# Patient Record
Sex: Female | Born: 1955
Health system: Southern US, Community
[De-identification: ages and names within clinical notes are randomized; demographics above are authoritative.]

## PROBLEM LIST (undated history)

## (undated) DIAGNOSIS — M1990 Unspecified osteoarthritis, unspecified site: Secondary | ICD-10-CM

## (undated) DIAGNOSIS — I700 Atherosclerosis of aorta: Secondary | ICD-10-CM

## (undated) DIAGNOSIS — N760 Acute vaginitis: Secondary | ICD-10-CM

## (undated) DIAGNOSIS — I471 Supraventricular tachycardia: Secondary | ICD-10-CM

## (undated) DIAGNOSIS — M810 Age-related osteoporosis without current pathological fracture: Secondary | ICD-10-CM

## (undated) DIAGNOSIS — E785 Hyperlipidemia, unspecified: Secondary | ICD-10-CM

## (undated) DIAGNOSIS — C50412 Malignant neoplasm of upper-outer quadrant of left female breast: Secondary | ICD-10-CM

## (undated) DIAGNOSIS — K219 Gastro-esophageal reflux disease without esophagitis: Secondary | ICD-10-CM

## (undated) DIAGNOSIS — Z923 Personal history of irradiation: Secondary | ICD-10-CM

## (undated) DIAGNOSIS — I77810 Thoracic aortic ectasia: Secondary | ICD-10-CM

## (undated) DIAGNOSIS — E669 Obesity, unspecified: Secondary | ICD-10-CM

## (undated) DIAGNOSIS — J040 Acute laryngitis: Secondary | ICD-10-CM

## (undated) DIAGNOSIS — E890 Postprocedural hypothyroidism: Secondary | ICD-10-CM

## (undated) DIAGNOSIS — I5032 Chronic diastolic (congestive) heart failure: Secondary | ICD-10-CM

## (undated) DIAGNOSIS — K635 Polyp of colon: Secondary | ICD-10-CM

## (undated) DIAGNOSIS — Z9889 Other specified postprocedural states: Secondary | ICD-10-CM

## (undated) DIAGNOSIS — R112 Nausea with vomiting, unspecified: Secondary | ICD-10-CM

## (undated) DIAGNOSIS — I10 Essential (primary) hypertension: Secondary | ICD-10-CM

## (undated) DIAGNOSIS — C801 Malignant (primary) neoplasm, unspecified: Secondary | ICD-10-CM

## (undated) DIAGNOSIS — D34 Benign neoplasm of thyroid gland: Secondary | ICD-10-CM

## (undated) DIAGNOSIS — N952 Postmenopausal atrophic vaginitis: Secondary | ICD-10-CM

## (undated) DIAGNOSIS — R931 Abnormal findings on diagnostic imaging of heart and coronary circulation: Secondary | ICD-10-CM

## (undated) DIAGNOSIS — F419 Anxiety disorder, unspecified: Secondary | ICD-10-CM

## (undated) DIAGNOSIS — R600 Localized edema: Secondary | ICD-10-CM

## (undated) HISTORY — DX: Atherosclerosis of aorta: I70.0

## (undated) HISTORY — DX: Acute vaginitis: N76.0

## (undated) HISTORY — DX: Hyperlipidemia, unspecified: E78.5

## (undated) HISTORY — PX: BREAST LUMPECTOMY: SHX2

## (undated) HISTORY — DX: Chronic diastolic (congestive) heart failure: I50.32

## (undated) HISTORY — DX: Polyp of colon: K63.5

## (undated) HISTORY — DX: Malignant neoplasm of upper-outer quadrant of left female breast: C50.412

## (undated) HISTORY — DX: Essential (primary) hypertension: I10

## (undated) HISTORY — DX: Postprocedural hypothyroidism: E89.0

## (undated) HISTORY — DX: Thoracic aortic ectasia: I77.810

## (undated) HISTORY — DX: Obesity, unspecified: E66.9

## (undated) HISTORY — DX: Unspecified osteoarthritis, unspecified site: M19.90

## (undated) HISTORY — PX: BREAST SURGERY: SHX581

## (undated) HISTORY — DX: Localized edema: R60.0

## (undated) HISTORY — DX: Postmenopausal atrophic vaginitis: N95.2

## (undated) HISTORY — DX: Abnormal findings on diagnostic imaging of heart and coronary circulation: R93.1

## (undated) HISTORY — DX: Age-related osteoporosis without current pathological fracture: M81.0

## (undated) HISTORY — DX: Supraventricular tachycardia: I47.1

---

## 1999-08-08 ENCOUNTER — Other Ambulatory Visit: Admission: RE | Admit: 1999-08-08 | Discharge: 1999-08-08 | Payer: Self-pay | Admitting: Family Medicine

## 2000-02-11 ENCOUNTER — Ambulatory Visit (HOSPITAL_COMMUNITY): Admission: RE | Admit: 2000-02-11 | Discharge: 2000-02-11 | Payer: Self-pay | Admitting: Family Medicine

## 2000-02-11 ENCOUNTER — Encounter: Payer: Self-pay | Admitting: Family Medicine

## 2000-08-11 ENCOUNTER — Other Ambulatory Visit: Admission: RE | Admit: 2000-08-11 | Discharge: 2000-08-11 | Payer: Self-pay | Admitting: Obstetrics and Gynecology

## 2001-04-28 ENCOUNTER — Emergency Department (HOSPITAL_COMMUNITY): Admission: EM | Admit: 2001-04-28 | Discharge: 2001-04-28 | Payer: Self-pay | Admitting: Emergency Medicine

## 2001-08-12 ENCOUNTER — Other Ambulatory Visit: Admission: RE | Admit: 2001-08-12 | Discharge: 2001-08-12 | Payer: Self-pay | Admitting: Obstetrics and Gynecology

## 2002-08-30 ENCOUNTER — Other Ambulatory Visit: Admission: RE | Admit: 2002-08-30 | Discharge: 2002-08-30 | Payer: Self-pay | Admitting: Obstetrics and Gynecology

## 2003-11-28 ENCOUNTER — Other Ambulatory Visit: Admission: RE | Admit: 2003-11-28 | Discharge: 2003-11-28 | Payer: Self-pay | Admitting: Obstetrics and Gynecology

## 2004-12-11 ENCOUNTER — Other Ambulatory Visit: Admission: RE | Admit: 2004-12-11 | Discharge: 2004-12-11 | Payer: Self-pay | Admitting: Obstetrics and Gynecology

## 2005-06-05 ENCOUNTER — Encounter: Admission: RE | Admit: 2005-06-05 | Discharge: 2005-06-05 | Payer: Self-pay | Admitting: Family Medicine

## 2005-06-08 ENCOUNTER — Encounter: Admission: RE | Admit: 2005-06-08 | Discharge: 2005-06-08 | Payer: Self-pay | Admitting: Family Medicine

## 2005-08-09 ENCOUNTER — Encounter: Admission: RE | Admit: 2005-08-09 | Discharge: 2005-08-09 | Payer: Self-pay | Admitting: Family Medicine

## 2006-01-01 ENCOUNTER — Other Ambulatory Visit: Admission: RE | Admit: 2006-01-01 | Discharge: 2006-01-01 | Payer: Self-pay | Admitting: Obstetrics and Gynecology

## 2009-12-11 ENCOUNTER — Encounter: Admission: RE | Admit: 2009-12-11 | Discharge: 2009-12-11 | Payer: Self-pay | Admitting: Obstetrics and Gynecology

## 2009-12-19 ENCOUNTER — Encounter: Admission: RE | Admit: 2009-12-19 | Discharge: 2009-12-19 | Payer: Self-pay | Admitting: Obstetrics and Gynecology

## 2009-12-26 ENCOUNTER — Encounter: Admission: RE | Admit: 2009-12-26 | Discharge: 2009-12-26 | Payer: Self-pay | Admitting: Obstetrics and Gynecology

## 2009-12-30 ENCOUNTER — Encounter: Admission: RE | Admit: 2009-12-30 | Discharge: 2009-12-30 | Payer: Self-pay | Admitting: General Surgery

## 2010-01-02 ENCOUNTER — Ambulatory Visit (HOSPITAL_BASED_OUTPATIENT_CLINIC_OR_DEPARTMENT_OTHER): Admission: RE | Admit: 2010-01-02 | Discharge: 2010-01-02 | Payer: Self-pay | Admitting: General Surgery

## 2010-01-02 ENCOUNTER — Encounter: Admission: RE | Admit: 2010-01-02 | Discharge: 2010-01-02 | Payer: Self-pay | Admitting: General Surgery

## 2010-01-08 ENCOUNTER — Ambulatory Visit: Payer: Self-pay | Admitting: Oncology

## 2010-01-20 ENCOUNTER — Ambulatory Visit: Admission: RE | Admit: 2010-01-20 | Discharge: 2010-03-11 | Payer: Self-pay | Admitting: Radiation Oncology

## 2010-03-19 ENCOUNTER — Ambulatory Visit: Payer: Self-pay | Admitting: Oncology

## 2010-03-20 ENCOUNTER — Ambulatory Visit: Payer: Self-pay | Admitting: Psychiatry

## 2010-04-17 ENCOUNTER — Ambulatory Visit: Payer: Self-pay | Admitting: Psychiatry

## 2010-05-19 ENCOUNTER — Ambulatory Visit: Payer: Self-pay | Admitting: Oncology

## 2010-05-21 LAB — CBC WITH DIFFERENTIAL/PLATELET
BASO%: 1.1 % (ref 0.0–2.0)
Basophils Absolute: 0 10e3/uL (ref 0.0–0.1)
EOS%: 3.6 % (ref 0.0–7.0)
Eosinophils Absolute: 0.2 10e3/uL (ref 0.0–0.5)
HCT: 37.8 % (ref 34.8–46.6)
HGB: 13.5 g/dL (ref 11.6–15.9)
LYMPH%: 26.6 % (ref 14.0–49.7)
MCH: 33.8 pg (ref 25.1–34.0)
MCHC: 35.6 g/dL (ref 31.5–36.0)
MCV: 94.9 fL (ref 79.5–101.0)
MONO#: 0.5 10e3/uL (ref 0.1–0.9)
MONO%: 11 % (ref 0.0–14.0)
NEUT#: 2.5 10e3/uL (ref 1.5–6.5)
NEUT%: 57.7 % (ref 38.4–76.8)
Platelets: 199 10e3/uL (ref 145–400)
RBC: 3.98 10e6/uL (ref 3.70–5.45)
RDW: 12.5 % (ref 11.2–14.5)
WBC: 4.3 10e3/uL (ref 3.9–10.3)
lymph#: 1.1 10e3/uL (ref 0.9–3.3)

## 2010-05-21 LAB — COMPREHENSIVE METABOLIC PANEL
ALT: 19 U/L (ref 0–35)
AST: 17 U/L (ref 0–37)
Albumin: 4.1 g/dL (ref 3.5–5.2)
BUN: 11 mg/dL (ref 6–23)
CO2: 24 mEq/L (ref 19–32)
Calcium: 9 mg/dL (ref 8.4–10.5)
Chloride: 106 mEq/L (ref 96–112)
Potassium: 4.4 mEq/L (ref 3.5–5.3)

## 2010-06-18 ENCOUNTER — Ambulatory Visit: Payer: Self-pay | Admitting: Oncology

## 2010-07-10 LAB — CBC WITH DIFFERENTIAL/PLATELET
BASO%: 0.4 % (ref 0.0–2.0)
Basophils Absolute: 0 10e3/uL (ref 0.0–0.1)
EOS%: 3.7 % (ref 0.0–7.0)
Eosinophils Absolute: 0.2 10e3/uL (ref 0.0–0.5)
HCT: 39.4 % (ref 34.8–46.6)
HGB: 13.5 g/dL (ref 11.6–15.9)
LYMPH%: 25.5 % (ref 14.0–49.7)
MCH: 32.7 pg (ref 25.1–34.0)
MCHC: 34.2 g/dL (ref 31.5–36.0)
MCV: 95.7 fL (ref 79.5–101.0)
MONO#: 0.6 10e3/uL (ref 0.1–0.9)
MONO%: 10.6 % (ref 0.0–14.0)
NEUT#: 3.5 10e3/uL (ref 1.5–6.5)
NEUT%: 59.8 % (ref 38.4–76.8)
Platelets: 229 10e3/uL (ref 145–400)
RBC: 4.11 10e6/uL (ref 3.70–5.45)
RDW: 12.6 % (ref 11.2–14.5)
WBC: 5.8 10e3/uL (ref 3.9–10.3)
lymph#: 1.5 10e3/uL (ref 0.9–3.3)

## 2010-09-26 ENCOUNTER — Ambulatory Visit: Payer: Self-pay | Admitting: Oncology

## 2010-11-22 ENCOUNTER — Other Ambulatory Visit: Payer: Self-pay | Admitting: Oncology

## 2010-11-22 DIAGNOSIS — Z9889 Other specified postprocedural states: Secondary | ICD-10-CM

## 2010-11-23 ENCOUNTER — Encounter: Payer: Self-pay | Admitting: Obstetrics and Gynecology

## 2010-12-22 ENCOUNTER — Ambulatory Visit
Admission: RE | Admit: 2010-12-22 | Discharge: 2010-12-22 | Disposition: A | Discharge status: Home / Self Care | Payer: BC Managed Care – PPO | Source: Ambulatory Visit | Attending: Oncology | Admitting: Oncology

## 2010-12-22 DIAGNOSIS — Z9889 Other specified postprocedural states: Secondary | ICD-10-CM

## 2010-12-23 ENCOUNTER — Encounter (HOSPITAL_BASED_OUTPATIENT_CLINIC_OR_DEPARTMENT_OTHER): Payer: BC Managed Care – PPO | Admitting: Oncology

## 2010-12-23 ENCOUNTER — Other Ambulatory Visit: Payer: Self-pay | Admitting: Oncology

## 2010-12-23 DIAGNOSIS — Z170 Estrogen receptor positive status [ER+]: Secondary | ICD-10-CM

## 2010-12-23 DIAGNOSIS — L408 Other psoriasis: Secondary | ICD-10-CM

## 2010-12-23 DIAGNOSIS — G8918 Other acute postprocedural pain: Secondary | ICD-10-CM

## 2010-12-23 DIAGNOSIS — C50219 Malignant neoplasm of upper-inner quadrant of unspecified female breast: Secondary | ICD-10-CM

## 2010-12-23 LAB — CBC WITH DIFFERENTIAL/PLATELET
Eosinophils Absolute: 0.1 10*3/uL (ref 0.0–0.5)
LYMPH%: 21.9 % (ref 14.0–49.7)
MCH: 32.2 pg (ref 25.1–34.0)
MCHC: 34.3 g/dL (ref 31.5–36.0)
MONO#: 0.6 10*3/uL (ref 0.1–0.9)
RBC: 4.24 10*6/uL (ref 3.70–5.45)
RDW: 13 % (ref 11.2–14.5)
WBC: 5.8 10*3/uL (ref 3.9–10.3)
lymph#: 1.3 10*3/uL (ref 0.9–3.3)

## 2010-12-23 LAB — COMPREHENSIVE METABOLIC PANEL
Albumin: 4.2 g/dL (ref 3.5–5.2)
Alkaline Phosphatase: 47 U/L (ref 39–117)
Chloride: 104 mEq/L (ref 96–112)
Creatinine, Ser: 0.82 mg/dL (ref 0.40–1.20)
Glucose, Bld: 101 mg/dL — ABNORMAL HIGH (ref 70–99)
Potassium: 4.1 mEq/L (ref 3.5–5.3)
Sodium: 139 mEq/L (ref 135–145)
Total Protein: 6.5 g/dL (ref 6.0–8.3)

## 2010-12-23 LAB — CANCER ANTIGEN 27.29: CA 27.29: 11 U/mL (ref 0–39)

## 2010-12-23 LAB — VITAMIN D 25 HYDROXY (VIT D DEFICIENCY, FRACTURES): Vit D, 25-Hydroxy: 40 ng/mL (ref 30–89)

## 2011-01-09 ENCOUNTER — Encounter (HOSPITAL_BASED_OUTPATIENT_CLINIC_OR_DEPARTMENT_OTHER): Payer: BC Managed Care – PPO | Admitting: Oncology

## 2011-01-09 DIAGNOSIS — Z170 Estrogen receptor positive status [ER+]: Secondary | ICD-10-CM

## 2011-01-09 DIAGNOSIS — C50219 Malignant neoplasm of upper-inner quadrant of unspecified female breast: Secondary | ICD-10-CM

## 2011-01-23 LAB — DIFFERENTIAL
Eosinophils Absolute: 0.1 10*3/uL (ref 0.0–0.7)
Eosinophils Relative: 2 % (ref 0–5)
Lymphocytes Relative: 29 % (ref 12–46)
Lymphs Abs: 1.8 10*3/uL (ref 0.7–4.0)
Monocytes Absolute: 0.5 10*3/uL (ref 0.1–1.0)
Monocytes Relative: 7 % (ref 3–12)

## 2011-01-23 LAB — COMPREHENSIVE METABOLIC PANEL
Albumin: 3.6 g/dL (ref 3.5–5.2)
BUN: 8 mg/dL (ref 6–23)
Calcium: 9.5 mg/dL (ref 8.4–10.5)
Creatinine, Ser: 0.79 mg/dL (ref 0.4–1.2)
GFR calc Af Amer: >60 mL/min (ref >60)
GFR calc non Af Amer: >60 mL/min (ref >60)
Glucose, Bld: 114 mg/dL — ABNORMAL HIGH (ref 70–99)
Potassium: 4.7 mEq/L (ref 3.5–5.1)
Sodium: 139 mEq/L (ref 135–145)
Total Protein: 6.7 g/dL (ref 6.0–8.3)

## 2011-01-23 LAB — URINALYSIS, ROUTINE W REFLEX MICROSCOPIC
Glucose, UA: NEGATIVE mg/dL
Hgb urine dipstick: NEGATIVE
Ketones, ur: NEGATIVE mg/dL
Nitrite: NEGATIVE
Protein, ur: NEGATIVE mg/dL
pH: 7.5 (ref 5.0–8.0)

## 2011-01-23 LAB — CBC
Hemoglobin: 14.4 g/dL (ref 12.0–15.0)
Platelets: 248 10*3/uL (ref 150–400)

## 2011-01-23 LAB — CANCER ANTIGEN 27.29: CA 27.29: 10 U/mL (ref 0–39)

## 2011-02-03 ENCOUNTER — Other Ambulatory Visit: Payer: Self-pay | Admitting: Obstetrics and Gynecology

## 2011-02-04 ENCOUNTER — Other Ambulatory Visit: Payer: Self-pay | Admitting: Internal Medicine

## 2011-02-04 DIAGNOSIS — M549 Dorsalgia, unspecified: Secondary | ICD-10-CM

## 2011-02-16 ENCOUNTER — Other Ambulatory Visit: Payer: BC Managed Care – PPO

## 2011-04-03 ENCOUNTER — Other Ambulatory Visit (HOSPITAL_COMMUNITY): Payer: Self-pay | Admitting: Internal Medicine

## 2011-04-03 DIAGNOSIS — E041 Nontoxic single thyroid nodule: Secondary | ICD-10-CM

## 2011-04-06 ENCOUNTER — Ambulatory Visit (HOSPITAL_COMMUNITY)
Admission: RE | Admit: 2011-04-06 | Discharge: 2011-04-06 | Disposition: A | Discharge status: Home / Self Care | Payer: BC Managed Care – PPO | Source: Ambulatory Visit | Attending: Internal Medicine | Admitting: Internal Medicine

## 2011-04-06 ENCOUNTER — Other Ambulatory Visit: Payer: Self-pay | Admitting: Oncology

## 2011-04-06 ENCOUNTER — Encounter (HOSPITAL_BASED_OUTPATIENT_CLINIC_OR_DEPARTMENT_OTHER): Payer: BC Managed Care – PPO | Admitting: Oncology

## 2011-04-06 DIAGNOSIS — E041 Nontoxic single thyroid nodule: Secondary | ICD-10-CM

## 2011-04-06 DIAGNOSIS — Z170 Estrogen receptor positive status [ER+]: Secondary | ICD-10-CM

## 2011-04-06 DIAGNOSIS — C50219 Malignant neoplasm of upper-inner quadrant of unspecified female breast: Secondary | ICD-10-CM

## 2011-04-06 DIAGNOSIS — E042 Nontoxic multinodular goiter: Secondary | ICD-10-CM | POA: Insufficient documentation

## 2011-04-06 LAB — COMPREHENSIVE METABOLIC PANEL
AST: 18 U/L (ref 0–37)
Albumin: 4 g/dL (ref 3.5–5.2)
BUN: 13 mg/dL (ref 6–23)
Calcium: 9.7 mg/dL (ref 8.4–10.5)
Chloride: 105 mEq/L (ref 96–112)
Glucose, Bld: 67 mg/dL — ABNORMAL LOW (ref 70–99)
Potassium: 4.2 mEq/L (ref 3.5–5.3)
Total Protein: 6.3 g/dL (ref 6.0–8.3)

## 2011-04-06 LAB — CBC WITH DIFFERENTIAL/PLATELET
Basophils Absolute: 0.1 10*3/uL (ref 0.0–0.1)
EOS%: 2.2 % (ref 0.0–7.0)
Eosinophils Absolute: 0.1 10*3/uL (ref 0.0–0.5)
HGB: 12.9 g/dL (ref 11.6–15.9)
NEUT#: 2.8 10*3/uL (ref 1.5–6.5)
RDW: 13 % (ref 11.2–14.5)
WBC: 4.8 10*3/uL (ref 3.9–10.3)
lymph#: 1.3 10*3/uL (ref 0.9–3.3)

## 2011-04-06 LAB — CANCER ANTIGEN 27.29: CA 27.29: 11 U/mL (ref 0–39)

## 2011-04-13 ENCOUNTER — Encounter (HOSPITAL_BASED_OUTPATIENT_CLINIC_OR_DEPARTMENT_OTHER): Payer: BC Managed Care – PPO | Admitting: Oncology

## 2011-04-13 DIAGNOSIS — C50219 Malignant neoplasm of upper-inner quadrant of unspecified female breast: Secondary | ICD-10-CM

## 2011-04-13 DIAGNOSIS — Z170 Estrogen receptor positive status [ER+]: Secondary | ICD-10-CM

## 2011-09-20 ENCOUNTER — Emergency Department (HOSPITAL_COMMUNITY)
Admission: EM | Admit: 2011-09-20 | Discharge: 2011-09-20 | Disposition: A | Discharge status: Home / Self Care | Payer: BC Managed Care – PPO | Attending: Emergency Medicine | Admitting: Emergency Medicine

## 2011-09-20 ENCOUNTER — Emergency Department (HOSPITAL_COMMUNITY): Payer: BC Managed Care – PPO

## 2011-09-20 DIAGNOSIS — I809 Phlebitis and thrombophlebitis of unspecified site: Secondary | ICD-10-CM | POA: Insufficient documentation

## 2011-09-20 DIAGNOSIS — W19XXXA Unspecified fall, initial encounter: Secondary | ICD-10-CM | POA: Insufficient documentation

## 2011-09-20 DIAGNOSIS — M79609 Pain in unspecified limb: Secondary | ICD-10-CM | POA: Insufficient documentation

## 2011-09-20 DIAGNOSIS — H5710 Ocular pain, unspecified eye: Secondary | ICD-10-CM | POA: Insufficient documentation

## 2011-09-20 DIAGNOSIS — M7989 Other specified soft tissue disorders: Secondary | ICD-10-CM

## 2011-09-20 DIAGNOSIS — Z79810 Long term (current) use of selective estrogen receptor modulators (SERMs): Secondary | ICD-10-CM | POA: Insufficient documentation

## 2011-09-20 DIAGNOSIS — Z853 Personal history of malignant neoplasm of breast: Secondary | ICD-10-CM | POA: Insufficient documentation

## 2011-09-20 DIAGNOSIS — J3489 Other specified disorders of nose and nasal sinuses: Secondary | ICD-10-CM | POA: Insufficient documentation

## 2011-09-20 HISTORY — DX: Malignant (primary) neoplasm, unspecified: C80.1

## 2011-09-20 HISTORY — DX: Gastro-esophageal reflux disease without esophagitis: K21.9

## 2011-09-20 HISTORY — DX: Anxiety disorder, unspecified: F41.9

## 2011-09-20 NOTE — ED Notes (Signed)
Ultrasound tech at the bedside.

## 2011-09-20 NOTE — ED Notes (Signed)
Pt reports having left lower leg soreness, cramping, and a knot on mid shin that's sore and hot to the touch. Pt also reports having fallen Sep 01, 2011 and incurred fracture of 4th metacarpal and facial bruising to the right side of her face. Pt has breast cancer, had a left lumpectomy, and recently started Tamoxifan. Husband at the bedside

## 2011-09-20 NOTE — Progress Notes (Signed)
Left lower extremity venous duplex doppler completed. No obvious evidence of DVT noted. Positive for superficial thrombosis noted in the varicose vein in the left calf above the ankle area.  [name] 09/20/2011, 1:31 PM

## 2011-09-20 NOTE — ED Provider Notes (Signed)
History     CSN: [id number] Arrival date & time: 09/20/2011 10:52 AM   First MD Initiated Contact with Patient 09/20/11 1111      Chief Complaint  Patient presents with  . Leg Pain    pt states left calf pain x3 days states warm to touch palpable knot noted on lower leg pt denies recent injury to leg pt alos c/o of right eye pain from injury fall several days     (Consider location/radiation/quality/duration/timing/severity/associated sxs/prior treatment) Patient is a 55 y.o. female presenting with leg pain. The history is provided by the patient.  Leg Pain  The incident occurred yesterday. The incident occurred at home. There was no injury mechanism. The pain is present in the left leg. The quality of the pain is described as aching and throbbing. The pain is at a severity of 5/10. The pain is moderate. The pain has been constant since onset. Pertinent negatives include no numbness, no inability to bear weight, no loss of motion, no muscle weakness and no loss of sensation. The symptoms are aggravated by activity and palpation. She has tried nothing for the symptoms. The treatment provided no relief.    Past Medical History  Diagnosis Date  . Cancer   . GERD (gastroesophageal reflux disease)   . Anxiety     Past Surgical History  Procedure Date  . Breast surgery     No family history on file.  History  Substance Use Topics  . Smoking status: Never Smoker   . Smokeless tobacco: Not on file  . Alcohol Use: No    OB History    Grav Para Term Preterm Abortions TAB SAB Ect Mult Living                  Review of Systems  Eyes: Positive for visual disturbance.       Fall 2 weeks ago and hit side of face and yesterday with some floaters in right visual field that have now resolved.  Respiratory: Negative for shortness of breath.   Cardiovascular: Positive for leg swelling. Negative for chest pain.  Neurological: Negative for numbness.  All other systems reviewed and are  negative.    Allergies  Sulfa drugs cross reactors  Home Medications   Current Outpatient Rx  Name Route Sig Dispense Refill  . ALPRAZOLAM 0.5 MG PO TABS Oral Take 0.5 mg by mouth as needed. FOR ANXIETY     . ASPIRIN EC 81 MG PO TBEC Oral Take 81 mg by mouth 2 (two) times daily.      Marland Kitchen CALCIUM CARBONATE-VITAMIN D 500-200 MG-UNIT PO TABS Oral Take 1 tablet by mouth daily.      Marland Kitchen DILTIAZEM HCL COATED BEADS 120 MG PO CP24 Oral Take 120 mg by mouth daily.      Marland Kitchen ESOMEPRAZOLE MAGNESIUM 40 MG PO CPDR Oral Take 40 mg by mouth daily before breakfast.      . FLAXSEED (LINSEED) 1000 MG PO CAPS Oral Take 1 capsule by mouth daily.      . IBANDRONATE SODIUM 150 MG PO TABS Oral Take 150 mg by mouth every 30 (thirty) days. Take in the morning with a full glass of water, on an empty stomach, and do not take anything else by mouth or lie down for the next 30 min.November 1st     . THERA M PLUS PO TABS Oral Take 1 tablet by mouth daily.      . SERTRALINE HCL 50 MG PO TABS Oral  Take 50 mg by mouth daily.      Marland Kitchen TAMOXIFEN CITRATE 20 MG PO TABS Oral Take 20 mg by mouth daily.      . TRAMADOL HCL 50 MG PO TABS Oral Take 50 mg by mouth every 6 (six) hours as needed. Maximum dose= 8 tablets per day FOR PAIN     . TRIAZOLAM 0.25 MG PO TABS Oral Take 0.25 mg by mouth as needed.        BP 136/74  Pulse 81  Temp(Src) 98.4 F (36.9 C) (Oral)  Resp 16  SpO2 99%  Physical Exam  Nursing note and vitals reviewed. Constitutional: She is oriented to person, place, and time. She appears well-developed and well-nourished. No distress.  HENT:  Head: Normocephalic.    Eyes: Conjunctivae and EOM are normal. Pupils are equal, round, and reactive to light.  Neck: Normal range of motion. Neck supple.  Cardiovascular: Normal rate, regular rhythm, normal heart sounds and intact distal pulses.  Exam reveals no friction rub.   No murmur heard. Pulmonary/Chest: Effort normal and breath sounds normal. She has no wheezes.  She has no rales.  Musculoskeletal: Normal range of motion. She exhibits no tenderness.       Left lower leg: She exhibits tenderness. She exhibits no swelling.       Legs:      No edema  Neurological: She is alert and oriented to person, place, and time. No cranial nerve deficit.  Skin: Skin is warm and dry. No rash noted.  Psychiatric: She has a normal mood and affect. Her behavior is normal.    ED Course  Procedures (including critical care time)  Labs Reviewed - No data to display Ct Maxillofacial Wo Cm  09/20/2011  *RADIOLOGY REPORT*  Clinical Data: Pain post fall.  CT MAXILLOFACIAL WITHOUT CONTRAST  Technique:  Multidetector CT imaging of the maxillofacial structures was performed. Multiplanar CT image reconstructions were also generated.  Comparison: None.  Findings: Orbits and globes intact.  Dental restorations result in some streak artifact degrading portions of the scan.  There is partial opacification of the left frontal sinus and scattered opacification of ethmoid air cells bilaterally.  The remainder of paranasal sinuses and mastoid air cells appear normally developed and well aerated.  Nasal septum midline.  Negative for fracture. Temporomandibular joints seated.  Mandible intact.  Visualized intracranial contents unremarkable.  Mild spondylitic changes noted C4-5, C5-6.  IMPRESSION:  1.  Negative for fracture or other acute bony abnormality. 2.  Mild left frontal and ethmoid sinus disease.  Original Report Authenticated By: Osa Craver, M.D.     No diagnosis found.    MDM   Pt here for 2 reasons.  First is for leg swelling and concern for DVT due to hx of breast CA on tamoxifen however feel most likely superficial phlebitis however good pulse and no signs of cellulitis.  Will Get doppler to eval.  Secondly pt fell 2 weeks ago and hit the side of her face on the floor with almost healed bruise now but states yesterday had some strange lines in her vision which  have now resolved.  States normal vision now and no deficits.  Ct of the face neg for orbital fx.  Pt to f/u with ophtho on tues.     1:44 PM Doppler neg for DVT and showed a superficial thrombophlebitis and CT neg for fx.  Will d/c home with supportive care.  Gwyneth Sprout, MD 09/20/11 1344

## 2011-09-22 ENCOUNTER — Other Ambulatory Visit: Payer: Self-pay | Admitting: Oncology

## 2011-09-22 ENCOUNTER — Other Ambulatory Visit (HOSPITAL_BASED_OUTPATIENT_CLINIC_OR_DEPARTMENT_OTHER): Payer: BC Managed Care – PPO | Admitting: Lab

## 2011-09-22 DIAGNOSIS — C50219 Malignant neoplasm of upper-inner quadrant of unspecified female breast: Secondary | ICD-10-CM

## 2011-09-22 DIAGNOSIS — Z170 Estrogen receptor positive status [ER+]: Secondary | ICD-10-CM

## 2011-09-22 LAB — COMPREHENSIVE METABOLIC PANEL
ALT: 12 U/L (ref 0–35)
AST: 14 U/L (ref 0–37)
Albumin: 3.4 g/dL — ABNORMAL LOW (ref 3.5–5.2)
BUN: 14 mg/dL (ref 6–23)
Calcium: 8.6 mg/dL (ref 8.4–10.5)
Chloride: 107 mEq/L (ref 96–112)
Potassium: 4.2 mEq/L (ref 3.5–5.3)
Total Protein: 5.9 g/dL — ABNORMAL LOW (ref 6.0–8.3)

## 2011-09-22 LAB — CBC WITH DIFFERENTIAL/PLATELET
Basophils Absolute: 0.1 10*3/uL (ref 0.0–0.1)
EOS%: 3.1 % (ref 0.0–7.0)
HGB: 12.2 g/dL (ref 11.6–15.9)
MCH: 32.5 pg (ref 25.1–34.0)
NEUT#: 2.8 10*3/uL (ref 1.5–6.5)
RDW: 12.9 % (ref 11.2–14.5)
lymph#: 1.7 10*3/uL (ref 0.9–3.3)

## 2011-10-01 ENCOUNTER — Ambulatory Visit (HOSPITAL_BASED_OUTPATIENT_CLINIC_OR_DEPARTMENT_OTHER): Payer: BC Managed Care – PPO | Admitting: Oncology

## 2011-10-01 VITALS — BP 123/80 | HR 67 | Temp 97.7°F | Ht 66.5 in | Wt 231.5 lb

## 2011-10-01 DIAGNOSIS — Z170 Estrogen receptor positive status [ER+]: Secondary | ICD-10-CM

## 2011-10-01 DIAGNOSIS — C50919 Malignant neoplasm of unspecified site of unspecified female breast: Secondary | ICD-10-CM

## 2011-10-01 DIAGNOSIS — C50219 Malignant neoplasm of upper-inner quadrant of unspecified female breast: Secondary | ICD-10-CM
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
## 2011-11-16 ENCOUNTER — Other Ambulatory Visit: Payer: Self-pay | Admitting: Oncology

## 2011-11-16 DIAGNOSIS — Z853 Personal history of malignant neoplasm of breast: Secondary | ICD-10-CM

## 2011-12-08 ENCOUNTER — Other Ambulatory Visit: Payer: Self-pay | Admitting: Internal Medicine

## 2011-12-08 DIAGNOSIS — E041 Nontoxic single thyroid nodule: Secondary | ICD-10-CM

## 2011-12-09 ENCOUNTER — Other Ambulatory Visit: Payer: BC Managed Care – PPO

## 2011-12-24 ENCOUNTER — Ambulatory Visit
Admission: RE | Admit: 2011-12-24 | Discharge: 2011-12-24 | Disposition: A | Discharge status: Home / Self Care | Payer: BC Managed Care – PPO | Source: Ambulatory Visit | Attending: Oncology | Admitting: Oncology

## 2011-12-24 DIAGNOSIS — Z853 Personal history of malignant neoplasm of breast: Secondary | ICD-10-CM

## 2012-02-23 ENCOUNTER — Ambulatory Visit
Admission: RE | Admit: 2012-02-23 | Discharge: 2012-02-23 | Disposition: A | Discharge status: Home / Self Care | Payer: 59 | Source: Ambulatory Visit | Attending: Internal Medicine | Admitting: Internal Medicine

## 2012-02-23 DIAGNOSIS — E041 Nontoxic single thyroid nodule: Secondary | ICD-10-CM

## 2012-03-30 ENCOUNTER — Other Ambulatory Visit (HOSPITAL_BASED_OUTPATIENT_CLINIC_OR_DEPARTMENT_OTHER): Payer: 59 | Admitting: Lab

## 2012-03-30 ENCOUNTER — Encounter: Payer: Self-pay | Admitting: Physician Assistant

## 2012-03-30 ENCOUNTER — Ambulatory Visit (HOSPITAL_BASED_OUTPATIENT_CLINIC_OR_DEPARTMENT_OTHER): Payer: 59 | Admitting: Physician Assistant

## 2012-03-30 ENCOUNTER — Telehealth: Payer: Self-pay | Admitting: Oncology

## 2012-03-30 VITALS — BP 118/77 | HR 72 | Temp 98.3°F | Ht 66.5 in | Wt 229.3 lb

## 2012-03-30 DIAGNOSIS — C50912 Malignant neoplasm of unspecified site of left female breast: Secondary | ICD-10-CM

## 2012-03-30 DIAGNOSIS — C50919 Malignant neoplasm of unspecified site of unspecified female breast: Secondary | ICD-10-CM

## 2012-03-30 DIAGNOSIS — Z170 Estrogen receptor positive status [ER+]: Secondary | ICD-10-CM

## 2012-03-30 LAB — CBC WITH DIFFERENTIAL/PLATELET
BASO%: 0.7 % (ref 0.0–2.0)
EOS%: 4.3 % (ref 0.0–7.0)
MCH: 31 pg (ref 25.1–34.0)
MCHC: 33.4 g/dL (ref 31.5–36.0)
MCV: 93 fL (ref 79.5–101.0)
MONO%: 9.4 % (ref 0.0–14.0)
NEUT#: 3.2 10*3/uL (ref 1.5–6.5)
RBC: 4.31 10*6/uL (ref 3.70–5.45)
RDW: 13.7 % (ref 11.2–14.5)
nRBC: 0 % (ref 0–0)

## 2012-03-30 LAB — COMPREHENSIVE METABOLIC PANEL
ALT: 18 U/L (ref 0–35)
Alkaline Phosphatase: 49 U/L (ref 39–117)
Creatinine, Ser: 0.78 mg/dL (ref 0.50–1.10)
Sodium: 142 mEq/L (ref 135–145)
Total Bilirubin: 0.5 mg/dL (ref 0.3–1.2)
Total Protein: 6.2 g/dL (ref 6.0–8.3)

## 2012-03-30 NOTE — Progress Notes (Signed)
ID: [name]   DOB: 10-24-1956  MR#: [id number]  QMG#:[id number]  HISTORY OF PRESENT ILLNESS: [name] has a history of fibrocystic change.  She took hormone replacement for many years and also vitamin E, but weaned herself off these medications around November 2010.  After a little while she started noticing discomfort in the left breast.  She went back on vitamin E, but this does not take care of the problem and she was evaluated by [name] who felt that repeat mammogram should be obtained, even though it was not quite a year from the last one.  [name] found the breast to be predominantly fatty, but in the area of the palpable abnormality there was a circumscribed lobular mass with slightly irregular margins.  The ultrasound showed a lobular septated cyst measuring 2.7 cm and other cysts were also noted corresponding to the palpable mass.  There was in addition a 4 mm area of hypoechoic shadowing that corresponded to the nodular component seen on mammography.  Biopsy of this new mass was recommended and performed by [name] at the breast center on December 19, 2009.  This showed ([id number]) an invasive ductal carcinoma in the setting of fibrocystic change.  This appeared to be low-grade and the tumor was strongly positive for ER and PR at 95 and 99% respectively.  It had the tumor cells at a low proliferation marker at 7%, HER2 was not amplified with CISH ratio of 1.43.  With this information the patient was referred to [name] and bilateral breast MRIs were obtained on December 26, 2009.  This showed a solitary 1.1 cm area of the regular enhancement in the upper inner left breast.  There were of course cluster of cysts also noted.  This was a secondary that had been biopsied on December 19, 2009 and had shown only fibrocystic change.  With this information after appropriate discussion [name] proceeded to left lumpectomy and sentinel lymph node sampling January 02, 2010.  The  pathology from this procedure ([id number]) showed a 9 mm invasive ductal carcinoma, grade 2 with clear margins and no evidence of angiolymphatic invasion.  Two sentinel lymph nodes were clear.  The patient received radiation therapy which was completed in May of 2011. She initially tried both anastrozole and letrozole, both with poor tolerance secondary to myalgias and arthralgias. She then tried tamoxifen which was then discontinued in November of 2012 due to concerns regarding thrombophlebitis and superficial blood clots. She was prescribed exemestane at that time which she has not yet begun.   INTERVAL HISTORY: [name] returns today for routine six-month followup of her left breast carcinoma. Recall that at her appointment here in November, she had recently been diagnosed with thrombophlebitis and superficial blood clots. She stopped tamoxifen accordingly in November of 2012. The plan had been to begin exemestane on January 1 of this year. Unfortunately, she is still "dealing with the leg issues" and is seeing [name] regularly for additional procedures. [name] tells me she "can just additional with the one thing at a time", and accordingly, has not yet started on the exemestane.  REVIEW OF SYSTEMS: Otherwise, [name] has had no illnesses and denies fevers, chills, or night sweats. She has only occasional hot flashes. She continues to have problems with psoriasis although this appears to be well-controlled at the current time. No additional rashes and no abnormal bleeding. No nausea or change in bowel habits. No chest pain or shortness of breath. No abnormal headaches or dizziness. No change  in vision. No unusual pain, other than discomfort in the lower extremities which is not new.  Otherwise a detailed review of systems is noncontributory.  PAST MEDICAL HISTORY: Past Medical History  Diagnosis Date  . Cancer   . GERD (gastroesophageal reflux disease)   . Anxiety     PAST  SURGICAL HISTORY: Past Surgical History  Procedure Date  . Breast surgery     FAMILY HISTORY No family history on file. The patient's father died from cardiac arrest at the age of 37.  The patient's mother died at the age of 27 from a stroke.  The patient has one brother who died from melanoma at the age of 59.  GYNECOLOGIC HISTORY:  She is GX, P0.  Last menstrual period 2004.  She took hormone replacement until November 2010.    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  They have a dog at home.  They are not church attenders.  ADVANCED DIRECTIVES:  HEALTH MAINTENANCE: History  Substance Use Topics  . Smoking status: Never Smoker   . Smokeless tobacco: Not on file  . Alcohol Use: No     Colonoscopy:  PAP:  Bone density:  Lipid panel:  Allergies  Allergen Reactions  . Sulfa Drugs Cross Reactors Hives    Current Outpatient Prescriptions  Medication Sig Dispense Refill  . ALPRAZolam (XANAX) 0.5 MG tablet Take 0.5 mg by mouth as needed. FOR ANXIETY       . calcium-vitamin D (OSCAL WITH D) 500-200 MG-UNIT per tablet Take 1 tablet by mouth daily.        Marland Kitchen diltiazem (CARDIZEM CD) 120 MG 24 hr capsule Take 120 mg by mouth daily.        Marland Kitchen esomeprazole (NEXIUM) 40 MG capsule Take 40 mg by mouth daily before breakfast.        . Flaxseed, Linseed, 1000 MG CAPS Take 1 capsule by mouth daily.        Marland Kitchen ibandronate (BONIVA) 150 MG tablet Take 150 mg by mouth every 30 (thirty) days. Take in the morning with a full glass of water, on an empty stomach, and do not take anything else by mouth or lie down for the next 30 min.November 1st       . Multiple Vitamins-Minerals (MULTIVITAMINS THER. W/MINERALS) TABS Take 1 tablet by mouth daily.        . sertraline (ZOLOFT) 50 MG tablet Take 50 mg by mouth daily.        . triazolam (HALCION) 0.25 MG tablet Take 0.25 mg by mouth as needed.        . warfarin  (COUMADIN) 10 MG tablet Take 10 mg by mouth daily. Dose varies      . exemestane (AROMASIN) 25 MG tablet Take 25 mg by mouth daily after breakfast. As of 03/30/2012, patient has not started medication yet.Marland KitchenMarland KitchenMarland KitchenMarland Kitchenplans to start by June 02, 2012      . traMADol (ULTRAM) 50 MG tablet Take 50 mg by mouth every 6 (six) hours as needed. Maximum dose= 8 tablets per day FOR PAIN         OBJECTIVE: Filed Vitals:   03/30/12 0855  BP: 118/77  Pulse: 72  Temp: 98.3 F (36.8 C)     Body mass index is 36.46 kg/(m^2).    ECOG FS: 1  Filed Weights   03/30/12 0855  Weight: 229 lb 4.8 oz (104.01 kg)  Physical Exam: A Caucasian female, appearing comfortable and in no acute distress. HEENT:  Sclerae anicteric, conjunctivae pink.  Oropharynx clear.    Nodes:  No cervical, supraclavicular, or axillary lymphadenopathy palpated.  Breast Exam:  Right breast is unremarkable with no masses, skin changes, or nipple inversion. Left breast is status post lumpectomy with well-healed incision. No suspicious nodularity or skin changes, no evidence of local recurrence.  Lungs:  Clear to auscultation bilaterally.  No crackles, rhonchi, or wheezes.   Heart:  Regular rate and rhythm.   Abdomen:  Soft, obese, nontender.  Positive bowel sounds.  No organomegaly or masses palpated.   Musculoskeletal:  No focal spinal tenderness to palpation.  Extremities:  Benign.  No peripheral edema or cyanosis.   Skin:  Benign.   Neuro:  Nonfocal. Alert and oriented x3.    LAB RESULTS: Lab Results  Component Value Date   WBC 5.3 03/30/2012   NEUTROABS 3.2 03/30/2012   HGB 13.4 03/30/2012   HCT 40.1 03/30/2012   MCV 93.0 03/30/2012   PLT 274 03/30/2012      Chemistry      Component Value Date/Time   NA 138 09/22/2011 0756   NA 138 09/22/2011 0756   K 4.2 09/22/2011 0756   K 4.2 09/22/2011 0756   CL 107 09/22/2011 0756   CL 107 09/22/2011 0756   CO2 22 09/22/2011 0756   CO2 22 09/22/2011 0756   BUN 14 09/22/2011 0756   BUN  14 09/22/2011 0756   CREATININE 0.77 09/22/2011 0756   CREATININE 0.77 09/22/2011 0756      Component Value Date/Time   CALCIUM 8.6 09/22/2011 0756   CALCIUM 8.6 09/22/2011 0756   ALKPHOS 44 09/22/2011 0756   ALKPHOS 44 09/22/2011 0756   AST 14 09/22/2011 0756   AST 14 09/22/2011 0756   ALT 12 09/22/2011 0756   ALT 12 09/22/2011 0756   BILITOT 0.5 09/22/2011 0756   BILITOT 0.5 09/22/2011 0756       Lab Results  Component Value Date   LABCA2 11 04/06/2011    STUDIES:  12/24/2011 DIGITAL DIAGNOSTIC BILATERAL MAMMOGRAM WITH CAD  Comparison: 12/22/2010. 12/11/2009 from [facility].  Findings: There are scattered fibroglandular densities. Left  lumpectomy changes are present. There is no dominant mass,  nonsurgical architectural distortion or calcification to suggest  malignancy.  Mammographic images were processed with CAD.  IMPRESSION:  No mammographic evidence of malignancy. Yearly diagnostic  mammography is suggested.  BI-RADS CATEGORY 2: Benign finding(s).  Original Report Authenticated By: Daryl Eastern, M.D.   ASSESSMENT: 56 year old [place] woman   (1)  status post left lumpectomy and sentinel lymph node biopsy March of 2011 for a T1b N0 (Stage I) invasive ductal carcinoma, strongly estrogen and progesterone receptor positive, HER-2 negative, with an MIB-1-1 of 7%.   (2)  She completed radiation in May of 2011   (3)  tried anastrozole and letrozole, both with poor tolerance.  More recently on tamoxifen,  Discontinued in November 2012 because of concerns regarding blood clots. Patient was prescribed exemestane, but has not yet begun on that medication.  PLAN: With regards to her breast cancer, Izadora seems to be doing well. As noted above, she has not yet begun on the exemestane and would like to have the "leg situation" taken care of first. She has placed a "deadline" on herself, however, and says she will begin the medication on August 1 if not  before.  She will return to see [place] in 6  months, and at that time hopefully she will have been on the exemestane for a minimum of 3 months. She knows to call in the meanwhile with any changes or problems.  Remigio Mcmillon    03/30/2012

## 2012-03-30 NOTE — Telephone Encounter (Signed)
gve the pt her nov 2013 appt calendar °

## 2012-06-15 ENCOUNTER — Other Ambulatory Visit: Payer: Self-pay | Admitting: Emergency Medicine

## 2012-06-15 DIAGNOSIS — C50912 Malignant neoplasm of unspecified site of left female breast: Secondary | ICD-10-CM

## 2012-06-15 MED ORDER — EXEMESTANE 25 MG PO TABS: 25.0000 mg | ORAL_TABLET | Freq: Every day | ORAL | Status: DC

## 2012-06-15 NOTE — Telephone Encounter (Signed)
Desk RN rec'd call from pt stating [facility] would be contacting this office for aromasin prescription and could she have 90 day supply.  Called pt back to inform her this was done.

## 2012-07-20 ENCOUNTER — Telehealth: Payer: Self-pay | Admitting: Oncology

## 2012-07-20 NOTE — Telephone Encounter (Signed)
S/W pt to advise on appt time change.   sed

## 2012-08-12 ENCOUNTER — Other Ambulatory Visit: Payer: Self-pay | Admitting: Internal Medicine

## 2012-08-12 DIAGNOSIS — E041 Nontoxic single thyroid nodule: Secondary | ICD-10-CM

## 2012-08-26 ENCOUNTER — Ambulatory Visit
Admission: RE | Admit: 2012-08-26 | Discharge: 2012-08-26 | Disposition: A | Discharge status: Home / Self Care | Payer: 59 | Source: Ambulatory Visit | Attending: Internal Medicine | Admitting: Internal Medicine

## 2012-08-26 DIAGNOSIS — E041 Nontoxic single thyroid nodule: Secondary | ICD-10-CM

## 2012-08-29 ENCOUNTER — Other Ambulatory Visit: Payer: Self-pay | Admitting: Internal Medicine

## 2012-08-29 DIAGNOSIS — E041 Nontoxic single thyroid nodule: Secondary | ICD-10-CM

## 2012-09-07 ENCOUNTER — Other Ambulatory Visit: Payer: Self-pay | Admitting: Internal Medicine

## 2012-09-07 ENCOUNTER — Other Ambulatory Visit (HOSPITAL_COMMUNITY)
Admission: RE | Admit: 2012-09-07 | Discharge: 2012-09-07 | Disposition: A | Discharge status: Home / Self Care | Payer: 59 | Source: Ambulatory Visit | Attending: Interventional Radiology | Admitting: Interventional Radiology

## 2012-09-07 ENCOUNTER — Ambulatory Visit
Admission: RE | Admit: 2012-09-07 | Discharge: 2012-09-07 | Disposition: A | Discharge status: Home / Self Care | Payer: 59 | Source: Ambulatory Visit | Attending: Internal Medicine | Admitting: Internal Medicine

## 2012-09-07 DIAGNOSIS — E049 Nontoxic goiter, unspecified: Secondary | ICD-10-CM | POA: Insufficient documentation

## 2012-09-07 DIAGNOSIS — E041 Nontoxic single thyroid nodule: Secondary | ICD-10-CM

## 2012-09-07 DIAGNOSIS — Z7901 Long term (current) use of anticoagulants: Secondary | ICD-10-CM

## 2012-09-07 LAB — APTT: aPTT: 29 seconds (ref 24–37)

## 2012-09-07 LAB — PROTIME-INR: Prothrombin Time: 13.2 seconds (ref 11.6–15.2)

## 2012-09-20 ENCOUNTER — Other Ambulatory Visit (HOSPITAL_BASED_OUTPATIENT_CLINIC_OR_DEPARTMENT_OTHER): Payer: 59 | Admitting: Lab

## 2012-09-20 DIAGNOSIS — C50912 Malignant neoplasm of unspecified site of left female breast: Secondary | ICD-10-CM

## 2012-09-20 DIAGNOSIS — C50919 Malignant neoplasm of unspecified site of unspecified female breast: Secondary | ICD-10-CM

## 2012-09-20 LAB — CBC WITH DIFFERENTIAL/PLATELET
BASO%: 0.6 % (ref 0.0–2.0)
Eosinophils Absolute: 0.2 10*3/uL (ref 0.0–0.5)
HCT: 42.4 % (ref 34.8–46.6)
LYMPH%: 24.2 % (ref 14.0–49.7)
MONO#: 0.6 10*3/uL (ref 0.1–0.9)
NEUT#: 3 10*3/uL (ref 1.5–6.5)
Platelets: 277 10*3/uL (ref 145–400)
RBC: 4.52 10*6/uL (ref 3.70–5.45)
WBC: 4.9 10*3/uL (ref 3.9–10.3)
lymph#: 1.2 10*3/uL (ref 0.9–3.3)

## 2012-09-20 LAB — COMPREHENSIVE METABOLIC PANEL (CC13)
ALT: 17 U/L (ref 0–55)
Albumin: 3.8 g/dL (ref 3.5–5.0)
CO2: 29 mEq/L (ref 22–29)
Calcium: 9.9 mg/dL (ref 8.4–10.4)
Chloride: 107 mEq/L (ref 98–107)
Glucose: 76 mg/dl (ref 70–99)
Sodium: 142 mEq/L (ref 136–145)
Total Bilirubin: 1.12 mg/dL (ref 0.20–1.20)
Total Protein: 7 g/dL (ref 6.4–8.3)

## 2012-09-20 LAB — CANCER ANTIGEN 27.29: CA 27.29: 17 U/mL (ref 0–39)

## 2012-09-27 ENCOUNTER — Telehealth: Payer: Self-pay | Admitting: *Deleted

## 2012-09-27 ENCOUNTER — Ambulatory Visit (HOSPITAL_BASED_OUTPATIENT_CLINIC_OR_DEPARTMENT_OTHER): Payer: 59 | Admitting: Oncology

## 2012-09-27 VITALS — BP 129/79 | HR 81 | Temp 98.4°F | Resp 20 | Ht 66.5 in | Wt 233.9 lb

## 2012-09-27 DIAGNOSIS — E041 Nontoxic single thyroid nodule: Secondary | ICD-10-CM

## 2012-09-27 DIAGNOSIS — C50912 Malignant neoplasm of unspecified site of left female breast: Secondary | ICD-10-CM

## 2012-09-27 DIAGNOSIS — C50219 Malignant neoplasm of upper-inner quadrant of unspecified female breast: Secondary | ICD-10-CM

## 2012-09-27 DIAGNOSIS — Z170 Estrogen receptor positive status [ER+]: Secondary | ICD-10-CM

## 2012-09-27 NOTE — Telephone Encounter (Signed)
Gave patient appointment for 03-2013 

## 2012-09-27 NOTE — Progress Notes (Signed)
ID: [name]   DOB: [birth year]  MR#: [id number]  WUJ#:[id number]  PCP: Gaspar Garbe, MD GYN: Miguel Aschoff SU: Claud Kelp MD OTHER MD: Will Kellie Simmering, Consuela Mimes, Mercie Eon  HISTORY OF PRESENT ILLNESS: [name] has a history of fibrocystic change.  She took hormone replacement for many years and also vitamin E, but weaned herself off these medications around November 2010.  After a little while she started noticing discomfort in the left breast.  She went back on vitamin E, but this does not take care of the problem and she was evaluated by Dr. Tenny Craw who felt that repeat mammogram should be obtained, even though it was not quite a year from the last one.  [name] found the breast to be predominantly fatty, but in the area of the palpable abnormality there was a circumscribed lobular mass with slightly irregular margins.  The ultrasound showed a lobular septated cyst measuring 2.7 cm and other cysts were also noted corresponding to the palpable mass.  There was in addition a 4 mm area of hypoechoic shadowing that corresponded to the nodular component seen on mammography.  Biopsy of this new mass was recommended and performed by Kerrin Mo at the breast center on December 19, 2009.  This showed ([id number]) an invasive ductal carcinoma in the setting of fibrocystic change.  This appeared to be low-grade and the tumor was strongly positive for ER and PR at 95 and 99% respectively.  It had the tumor cells at a low proliferation marker at 7%, HER2 was not amplified with CISH ratio of 1.43.  Bilateral breast MRIs were obtained on December 26, 2009.  This showed a solitary 1.1 cm area of the regular enhancement in the upper inner left breast.  There were of course cluster of cysts also noted.  This was a secondary that had been biopsied on December 19, 2009 and had shown only fibrocystic change.  With this information after appropriate discussion Dr. Derrell Lolling proceeded to  left lumpectomy and sentinel lymph node sampling January 02, 2010.  The pathology from this procedure ([id number]) showed a 9 mm invasive ductal carcinoma, grade 2 with clear margins and no evidence of angiolymphatic invasion.  Two sentinel lymph nodes were clear. Her subsequent history is as detailed below  INTERVAL HISTORY: [name] returns today with her husband "Richardson Dopp" for routine six-month followup of her left breast carcinoma. Since her last visit here she had a repeat thyroid ultrasound which showed a growth of 3 mm of her dominant nodule, and this was biopsied. The results were nondiagnostic.  REVIEW OF SYSTEMS: She started exemestane 06/02/2012. She developed some pain and stiffness in her joints and some numbness and tingling in her fingertips and toe tips. She tells me she can "live with this", however, and she has not discontinued the medication. She tells me she did very well with the phlebitis surgery and is currently off Coumadin. She has significant vaginal dryness, but no hot flashes from the exemestane. Her psoriasis is moderately controlled, as is her chronic anxiety. She has not had any active problems from her supraventricular Tachycardia history (and she does continue to drink significant amounts of coffee daily). Otherwise a detailed review of systems today was noncontributory.  PAST MEDICAL HISTORY: Past Medical History  Diagnosis Date  . Cancer   . GERD (gastroesophageal reflux disease)   . Anxiety     PAST SURGICAL HISTORY: Past Surgical History  Procedure Date  . Breast surgery     FAMILY HISTORY  No family history on file. The patient's father died from cardiac arrest at the age of 15.  The patient's mother died at the age of 68 from a stroke.  The patient has one brother who died from melanoma at the age of 60.  GYNECOLOGIC HISTORY:  She is GX, P0.  Last menstrual period 2004.  She took hormone replacement until November 2010.    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  They have a dog at home.  They are not church attenders.  ADVANCED DIRECTIVES: In place  HEALTH MAINTENANCE: History  Substance Use Topics  . Smoking status: Never Smoker   . Smokeless tobacco: Not on file  . Alcohol Use: No     Colonoscopy:  PAP:  Bone density:  Lipid panel:  Allergies  Allergen Reactions  . Sulfa Drugs Cross Reactors Hives    Current Outpatient Prescriptions  Medication Sig Dispense Refill  . ALPRAZolam (XANAX) 0.5 MG tablet Take 0.5 mg by mouth as needed. FOR ANXIETY       . calcium-vitamin D (OSCAL WITH D) 500-200 MG-UNIT per tablet Take 1 tablet by mouth daily.        Marland Kitchen diltiazem (CARDIZEM CD) 120 MG 24 hr capsule Take 120 mg by mouth daily.        Marland Kitchen esomeprazole (NEXIUM) 40 MG capsule Take 40 mg by mouth daily before breakfast.        . exemestane (AROMASIN) 25 MG tablet Take 1 tablet (25 mg total) by mouth daily.  90 tablet  12  . Flaxseed, Linseed, 1000 MG CAPS Take 1 capsule by mouth daily.        Marland Kitchen ibandronate (BONIVA) 150 MG tablet Take 150 mg by mouth every 30 (thirty) days. Take in the morning with a full glass of water, on an empty stomach, and do not take anything else by mouth or lie down for the next 30 min.November 1st       . Multiple Vitamins-Minerals (MULTIVITAMINS THER. W/MINERALS) TABS Take 1 tablet by mouth daily.        . sertraline (ZOLOFT) 50 MG tablet Take 50 mg by mouth daily.        . traMADol (ULTRAM) 50 MG tablet Take 50 mg by mouth every 6 (six) hours as needed. Maximum dose= 8 tablets per day FOR PAIN       . triazolam (HALCION) 0.25 MG tablet Take 0.25 mg by mouth as needed.        . warfarin (COUMADIN) 10 MG tablet Take 10 mg by mouth daily. Dose varies        OBJECTIVE: Middle-aged white woman who appears well Filed Vitals:   09/27/12 0926  BP: 129/79  Pulse: 81  Temp: 98.4 F (36.9 C)  Resp: 20     Body mass  index is 37.19 kg/(m^2).    ECOG FS: 0  Filed Weights   09/27/12 0926  Weight: 233 lb 14.4 oz (106.096 kg)   Physical Exam: Sclerae unicteric Oropharynx clear No cervical or supraclavicular adenopathy; no palpable thyromegaly Lungs no rales or rhonchi Heart regular rate and rhythm Abd benign MSK no focal spinal tenderness, no peripheral edema Neuro: nonfocal Breasts: The right breast is unremarkable; the left breast is status post lumpectomy and radiation; the left axilla is clear. There is no evidence of local recurrence Skin: Minimal dermatophytosis under each breast    LAB  RESULTS: Lab Results  Component Value Date   WBC 4.9 09/20/2012   NEUTROABS 3.0 09/20/2012   HGB 14.5 09/20/2012   HCT 42.4 09/20/2012   MCV 93.9 09/20/2012   PLT 277 09/20/2012      Chemistry      Component Value Date/Time   NA 142 09/20/2012 0810   NA 142 03/30/2012 0820   K 4.0 09/20/2012 0810   K 4.0 03/30/2012 0820   CL 107 09/20/2012 0810   CL 108 03/30/2012 0820   CO2 29 09/20/2012 0810   CO2 25 03/30/2012 0820   BUN 15.0 09/20/2012 0810   BUN 13 03/30/2012 0820   CREATININE 0.8 09/20/2012 0810   CREATININE 0.78 03/30/2012 0820      Component Value Date/Time   CALCIUM 9.9 09/20/2012 0810   CALCIUM 9.2 03/30/2012 0820   ALKPHOS 44 09/20/2012 0810   ALKPHOS 49 03/30/2012 0820   AST 18 09/20/2012 0810   AST 17 03/30/2012 0820   ALT 17 09/20/2012 0810   ALT 18 03/30/2012 0820   BILITOT 1.12 09/20/2012 0810   BILITOT 0.5 03/30/2012 0820       Lab Results  Component Value Date   LABCA2 17 09/20/2012    STUDIES:  12/24/2011 DIGITAL DIAGNOSTIC BILATERAL MAMMOGRAM WITH CAD  Comparison: 12/22/2010. 12/11/2009 from [facility].  Findings: There are scattered fibroglandular densities. Left  lumpectomy changes are present. There is no dominant mass,  nonsurgical architectural distortion or calcification to suggest  malignancy.  Mammographic images were processed with CAD.    IMPRESSION:  No mammographic evidence of malignancy. Yearly diagnostic  mammography is suggested.  BI-RADS CATEGORY 2: Benign finding(s).  Original Report Authenticated By: Daryl Eastern, M.D.   ASSESSMENT: 56 year old [place] woman   (1)  status post left lumpectomy and sentinel lymph node biopsy March of 2011 for a T1b N0 (Stage IA) invasive ductal carcinoma, grade 2, strongly estrogen and progesterone receptor positive, HER-2 negative, with an MIB-1-1 of 7%.   (2)  She completed radiation in May of 2011   (3)  tried anastrozole and letrozole, with poor tolerance; discontinued tamoxifen November 2012 because of concerns regarding blood clots. Started exemestane 06/02/2012  (4) enlarging thyroid nodule s/p biopsy 09/07/2012 showing a follicular lesion of undetermined significance  PLAN: We talked extensively about her thyroid issues and she understands there is a small chance of her having a follicular thyroid cancer. We talked about possible referral to a surgeon, and she is interested in seeing Dr. Darnell Level cord but not yet". It would be very reasonable for her to have a repeat thyroid ultrasound in 6 months, and if there has been further growth of the lesion in question, referral could be placed at that point  As far as her breast cancer is concerned, she is sticking with the exemestane which is terrific and she will see [place] again in 6 months. I don't have a simple solution for the vaginal dryness issue, unfortunately. She will continue to work with Dr. Wylene Simmer regarding bone density issues. Kriste Basque will return to see [place] in 6 months. She knows to call for any problems that may develop before that     nfortunately. Dawaun Brancato C    09/27/2012

## 2012-10-11 ENCOUNTER — Telehealth: Payer: Self-pay | Admitting: Emergency Medicine

## 2012-10-11 NOTE — Telephone Encounter (Signed)
Returned patient's call in regards to a release of records to [employer].  Patient states that she received a letter with a consent for release of records for her to sign for [place] to release records to [facility].  At this time she is unsure if she is going to proceed and is requesting that her records not be sent to [facility].   Medical Records staff notified of patient's wishes.  Instructed patient to call for any future assistance or concerns.

## 2012-10-12 ENCOUNTER — Telehealth: Payer: Self-pay | Admitting: Oncology

## 2012-10-12 NOTE — Telephone Encounter (Signed)
Notified Gunnar Fusi @ [facility] about the request and faxed it to [phone].

## 2012-11-14 ENCOUNTER — Encounter (INDEPENDENT_AMBULATORY_CARE_PROVIDER_SITE_OTHER): Payer: Self-pay | Admitting: Surgery

## 2012-11-14 ENCOUNTER — Ambulatory Visit (INDEPENDENT_AMBULATORY_CARE_PROVIDER_SITE_OTHER): Payer: 59 | Admitting: Surgery

## 2012-11-14 ENCOUNTER — Other Ambulatory Visit (INDEPENDENT_AMBULATORY_CARE_PROVIDER_SITE_OTHER): Payer: Self-pay | Admitting: Surgery

## 2012-11-14 VITALS — BP 140/82 | HR 64 | Temp 98.8°F | Resp 18 | Ht 66.5 in | Wt 238.0 lb

## 2012-11-14 DIAGNOSIS — D440 Neoplasm of uncertain behavior of thyroid gland: Secondary | ICD-10-CM

## 2012-11-14 DIAGNOSIS — D449 Neoplasm of uncertain behavior of unspecified endocrine gland: Secondary | ICD-10-CM

## 2012-11-14 NOTE — Progress Notes (Signed)
General Surgery [facility]  Chief Complaint  Patient presents with  . New Evaluation    thyroid nodule with atypia - referrall from Dr. Guerry Bruin    HISTORY: Patient is a 57 year old white female referred by her medical oncologist and primary care physician for evaluation of a left-sided thyroid nodule.  A multinodular thyroid gland was identified in early 2013. Patient underwent thyroid ultrasound in April 2013 which showed a dominant 1.1 cm nodule in the inferior left lobe. Six-month followup thyroid ultrasound showed slight interval enlargement to 1.4 cm. 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 There was also atypia in the follicular cells with nuclear grooves and nuclear overlap.  Patient is now referred for recommendations regarding management.  Patient has no prior history of thyroid disease. She has never been on thyroid medication. She has had no surgery on the head or neck. There is no family history of thyroid cancer. There is no family history of other endocrinopathy.  Past Medical History  Diagnosis Date  . GERD (gastroesophageal reflux disease)   . Anxiety   . Cancer     left breast  . SVT (supraventricular tachycardia)   . Osteoporosis   . Arthritis      Current Outpatient Prescriptions  Medication Sig Dispense Refill  . ALPRAZolam (XANAX) 0.5 MG tablet Take 0.5 mg by mouth as needed. FOR ANXIETY       . calcium-vitamin D (OSCAL WITH D) 500-200 MG-UNIT per tablet Take 1 tablet by mouth daily.        Marland Kitchen CLOBEX SPRAY 0.05 % external spray daily.      Marland Kitchen diltiazem (CARDIZEM CD) 120 MG 24 hr capsule Take 120 mg by mouth daily.        Marland Kitchen esomeprazole (NEXIUM) 40 MG capsule Take 40 mg by mouth daily before breakfast.        . exemestane (AROMASIN) 25 MG tablet Take 1 tablet (25 mg total) by mouth daily.  90 tablet  12  . Flaxseed, Linseed, 1000 MG CAPS Take 1 capsule by mouth daily.        .  Multiple Vitamins-Minerals (MULTIVITAMINS THER. W/MINERALS) TABS Take 1 tablet by mouth daily.        . sertraline (ZOLOFT) 50 MG tablet Take 50 mg by mouth daily.        . traMADol (ULTRAM) 50 MG tablet Take 50 mg by mouth every 6 (six) hours as needed. Maximum dose= 8 tablets per day FOR PAIN       . triazolam (HALCION) 0.25 MG tablet Take 0.25 mg by mouth as needed.           Allergies  Allergen Reactions  . Sulfa Drugs Cross Reactors Hives     Family History  Problem Relation Age of Onset  . Stroke Mother   . Heart disease Father   . Cancer Brother     melanoma     History   Social History  . Marital Status: Married    Spouse Name: N/A    Number of Children: N/A  . Years of Education: N/A   Social History Main Topics  . Smoking status: Former Games developer  . Smokeless tobacco: None  . Alcohol Use: Yes     Comment: social  . Drug Use: No  . Sexually Active:    Other Topics Concern  . None   Social History Narrative  . None     REVIEW OF  SYSTEMS - PERTINENT POSITIVES ONLY: Patient denies tremors. She denies palpitations. She denies compressive symptoms.  EXAM: Filed Vitals:   11/14/12 1022  BP: 140/82  Pulse: 64  Temp: 98.8 F (37.1 C)  Resp: 18    HEENT: normocephalic; pupils equal and reactive; sclerae clear; dentition good; mucous membranes moist NECK:  No dominant nodules palpable in either thyroid lobe; symmetric on extension; no palpable anterior or posterior cervical lymphadenopathy; no supraclavicular masses; no tenderness CHEST: clear to auscultation bilaterally without rales, rhonchi, or wheezes CARDIAC: regular rate and rhythm without significant murmur; peripheral pulses are full EXT:  non-tender without edema; no deformity NEURO: no gross focal deficits; no sign of tremor   LABORATORY RESULTS: See [facility] (CHL-Epic) for most recent results   RADIOLOGY RESULTS: See [facility] (CHL-Epic) for most recent  results   IMPRESSION: #1 multinodular thyroid gland, asymptomatic #2 dominant nodule left thyroid lobe, 1.4 cm, with Hurthle cell change and atypia  PLAN: I had a lengthy discussion with the patient and her husband. I provided them with written literature to review regarding thyroid surgery. We reviewed the thyroid ultrasounds and the cytopathology report at length. I believe there is approximately a 20-25% risk of malignancy based on the cytopathologic findings.  We discussed options for management including continued observation with short-term thyroid ultrasound followup. We also discussed thyroid surgery with either lobectomy or total thyroidectomy. We discussed the risk and benefits of in the procedure. We discussed potential complications including recurrent laryngeal nerve injury and injury to parathyroid glands. We discussed the hospital stay to be anticipated. We discussed the need for radioactive iodine treatment in the event of malignancy. We discussed the need for lifelong thyroid hormone replacement therapy.  Patient would like to consider this for a few days and then contact our office with her decision regarding continued observation versus proceeding with surgery. We will await her phone call.  Velora Heckler, MD, FACS General & Endocrine Surgery [facility]   Visit Diagnoses: 1. Neoplasm of uncertain behavior of thyroid gland, left lobe     Primary Care Physician: Gaspar Garbe, MD

## 2012-11-14 NOTE — Patient Instructions (Signed)

## 2012-11-16 ENCOUNTER — Telehealth (INDEPENDENT_AMBULATORY_CARE_PROVIDER_SITE_OTHER): Payer: Self-pay

## 2012-11-16 NOTE — Telephone Encounter (Signed)
Pt called and stated she does want to proceed with surgery. Orders are in epic and scheduling sheet given to surgery schedulers.

## 2012-11-18 ENCOUNTER — Telehealth: Payer: Self-pay | Admitting: Oncology

## 2012-11-18 NOTE — Telephone Encounter (Signed)
Received copy of email from Trey Paula re pt calling to r/s appt for May with no reponse. S/w pt today and per pt r/s 5/20 lb to 5/14 @ 2:30pm. Pt has new d/t and will keep 5/27 f/u as scheduled.

## 2012-11-23 ENCOUNTER — Other Ambulatory Visit: Payer: Self-pay | Admitting: Oncology

## 2012-11-23 DIAGNOSIS — Z853 Personal history of malignant neoplasm of breast: Secondary | ICD-10-CM

## 2012-11-24 ENCOUNTER — Encounter (INDEPENDENT_AMBULATORY_CARE_PROVIDER_SITE_OTHER): Payer: Self-pay | Admitting: General Surgery

## 2012-11-24 ENCOUNTER — Telehealth (INDEPENDENT_AMBULATORY_CARE_PROVIDER_SITE_OTHER): Payer: Self-pay | Admitting: General Surgery

## 2012-11-24 NOTE — Telephone Encounter (Signed)
Pt remembered some things she forgot to mention to Dr. Gerrit Friends at her initial assessment.  Added "osteoporosis" to her problem list and "laser eye surgery" to her surgeries list.  Also she has experienced some numbness in her Lt hand, involving the entire hand.  She also has arthritis in the Lt thumb.

## 2012-12-16 ENCOUNTER — Encounter (HOSPITAL_COMMUNITY): Payer: Self-pay | Admitting: Pharmacy Technician

## 2012-12-20 ENCOUNTER — Encounter (HOSPITAL_COMMUNITY): Payer: Self-pay

## 2012-12-20 ENCOUNTER — Encounter (HOSPITAL_COMMUNITY)
Admission: RE | Admit: 2012-12-20 | Discharge: 2012-12-20 | Disposition: A | Discharge status: Home / Self Care | Payer: 59 | Source: Ambulatory Visit | Attending: Surgery | Admitting: Surgery

## 2012-12-20 ENCOUNTER — Ambulatory Visit (HOSPITAL_COMMUNITY)
Admission: RE | Admit: 2012-12-20 | Discharge: 2012-12-20 | Disposition: A | Discharge status: Home / Self Care | Payer: 59 | Source: Ambulatory Visit | Attending: Surgery | Admitting: Surgery

## 2012-12-20 DIAGNOSIS — Z01812 Encounter for preprocedural laboratory examination: Secondary | ICD-10-CM | POA: Insufficient documentation

## 2012-12-20 DIAGNOSIS — J040 Acute laryngitis: Secondary | ICD-10-CM

## 2012-12-20 DIAGNOSIS — D34 Benign neoplasm of thyroid gland: Secondary | ICD-10-CM

## 2012-12-20 DIAGNOSIS — D497 Neoplasm of unspecified behavior of endocrine glands and other parts of nervous system: Secondary | ICD-10-CM | POA: Insufficient documentation

## 2012-12-20 HISTORY — PX: EYE SURGERY: SHX253

## 2012-12-20 HISTORY — DX: Other specified postprocedural states: R11.2

## 2012-12-20 HISTORY — DX: Acute laryngitis: J04.0

## 2012-12-20 HISTORY — DX: Benign neoplasm of thyroid gland: D34

## 2012-12-20 HISTORY — DX: Other specified postprocedural states: Z98.890

## 2012-12-20 LAB — CBC
MCH: 31.3 pg (ref 26.0–34.0)
Platelets: 274 10*3/uL (ref 150–400)
RBC: 4.54 MIL/uL (ref 3.87–5.11)
WBC: 5.3 10*3/uL (ref 4.0–10.5)

## 2012-12-20 LAB — BASIC METABOLIC PANEL
CO2: 29 mEq/L (ref 19–32)
GFR calc Af Amer: >90 mL/min (ref >90)
GFR calc non Af Amer: 80 mL/min — ABNORMAL LOW (ref >90)
Potassium: 4.4 mEq/L (ref 3.5–5.1)
Sodium: 139 mEq/L (ref 135–145)

## 2012-12-20 NOTE — Patient Instructions (Addendum)
20 [name]  12/20/2012   Your procedure is scheduled on:   12-30-2012  Report to [facility] at     0700   AM.  Call this number if you have problems the morning of surgery: [phone]  Or Presurgical Testing [phone](Khiana Camino)     Do not eat food:After Midnight.    Take these medicines the morning of surgery with A SIP OF WATER: Diltiazem. Nexium. Sertraline. Ultram. Aprazolam. Exemestane.   Do not wear jewelry, make-up or nail polish.  Do not wear lotions, powders, or perfumes. You may wear deodorant.  Do not shave 12 hours prior to first CHG shower(legs and under arms).(face and neck okay.)  Do not bring valuables to the hospital.  Contacts, dentures or bridgework,body piercing,  may not be worn into surgery.  Leave suitcase in the car. After surgery it may be brought to your room.  For patients admitted to the hospital, checkout time is 11:00 AM the day of discharge.   Patients discharged the day of surgery will not be allowed to drive home. Must have responsible person with you x 24 hours once discharged.  Name and phone number of your driver: Paul"Bear" Jennette Bill- spouse [phone] cell  Special Instructions: CHG Shower Use Special Wash: see special instructions.(avoid face and genitals)   Please read over the following fact sheets that you were given: MRSA Information, Incentive Spirometry Instruction.    Failure to follow these instructions may result in Cancellation of your surgery.   Patient signature_______________________________________________________

## 2012-12-20 NOTE — Pre-Procedure Instructions (Signed)
12-20-12 EKG/CXR done today

## 2012-12-26 ENCOUNTER — Ambulatory Visit
Admission: RE | Admit: 2012-12-26 | Discharge: 2012-12-26 | Disposition: A | Discharge status: Home / Self Care | Payer: 59 | Source: Ambulatory Visit | Attending: Oncology | Admitting: Oncology

## 2012-12-26 DIAGNOSIS — Z853 Personal history of malignant neoplasm of breast: Secondary | ICD-10-CM

## 2012-12-30 ENCOUNTER — Encounter (HOSPITAL_COMMUNITY): Payer: Self-pay | Admitting: *Deleted

## 2012-12-30 ENCOUNTER — Encounter (HOSPITAL_COMMUNITY): Payer: Self-pay | Admitting: Anesthesiology

## 2012-12-30 ENCOUNTER — Observation Stay (HOSPITAL_COMMUNITY)
Admission: RE | Admit: 2012-12-30 | Discharge: 2012-12-31 | Disposition: A | Discharge status: Home / Self Care | Payer: 59 | Source: Ambulatory Visit | Attending: Surgery | Admitting: Surgery

## 2012-12-30 ENCOUNTER — Ambulatory Visit (HOSPITAL_COMMUNITY): Payer: 59 | Admitting: Anesthesiology

## 2012-12-30 ENCOUNTER — Encounter (HOSPITAL_COMMUNITY)
Admission: RE | Disposition: A | Discharge status: Home / Self Care | Payer: Self-pay | Source: Ambulatory Visit | Attending: Surgery

## 2012-12-30 DIAGNOSIS — D449 Neoplasm of uncertain behavior of unspecified endocrine gland: Secondary | ICD-10-CM | POA: Insufficient documentation

## 2012-12-30 DIAGNOSIS — D440 Neoplasm of uncertain behavior of thyroid gland: Secondary | ICD-10-CM | POA: Diagnosis present

## 2012-12-30 DIAGNOSIS — Z79899 Other long term (current) drug therapy: Secondary | ICD-10-CM | POA: Insufficient documentation

## 2012-12-30 DIAGNOSIS — E042 Nontoxic multinodular goiter: Principal | ICD-10-CM | POA: Insufficient documentation

## 2012-12-30 DIAGNOSIS — Z853 Personal history of malignant neoplasm of breast: Secondary | ICD-10-CM | POA: Insufficient documentation

## 2012-12-30 DIAGNOSIS — D34 Benign neoplasm of thyroid gland: Secondary | ICD-10-CM | POA: Insufficient documentation

## 2012-12-30 DIAGNOSIS — K219 Gastro-esophageal reflux disease without esophagitis: Secondary | ICD-10-CM | POA: Insufficient documentation

## 2012-12-30 DIAGNOSIS — M810 Age-related osteoporosis without current pathological fracture: Secondary | ICD-10-CM | POA: Insufficient documentation

## 2012-12-30 DIAGNOSIS — R000 Tachycardia, unspecified: Secondary | ICD-10-CM | POA: Insufficient documentation

## 2012-12-30 DIAGNOSIS — R946 Abnormal results of thyroid function studies: Secondary | ICD-10-CM | POA: Insufficient documentation

## 2012-12-30 HISTORY — PX: THYROIDECTOMY: SHX17

## 2012-12-30 SURGERY — THYROIDECTOMY
Anesthesia: General | Site: Throat | Wound class: Clean

## 2012-12-30 MED ORDER — HYDROCODONE-ACETAMINOPHEN 5-325 MG PO TABS: 1.0000 | ORAL_TABLET | ORAL | Status: DC | PRN

## 2012-12-30 MED ORDER — SUCCINYLCHOLINE CHLORIDE 20 MG/ML IJ SOLN
INTRAMUSCULAR | Status: DC | PRN
  Administered 2012-12-30: 100 mg via INTRAVENOUS

## 2012-12-30 MED ORDER — ACETAMINOPHEN 325 MG PO TABS
650.0000 mg | ORAL_TABLET | ORAL | Status: DC | PRN
  Administered 2012-12-30: 650 mg via ORAL
  Filled 2012-12-30: qty 2

## 2012-12-30 MED ORDER — PHENYLEPHRINE HCL 10 MG/ML IJ SOLN
INTRAMUSCULAR | Status: DC | PRN
  Administered 2012-12-30 (×2): 80 ug via INTRAVENOUS

## 2012-12-30 MED ORDER — MIDAZOLAM HCL 5 MG/5ML IJ SOLN
INTRAMUSCULAR | Status: DC | PRN
  Administered 2012-12-30: 1 mg via INTRAVENOUS
  Administered 2012-12-30: 2 mg via INTRAVENOUS
  Administered 2012-12-30: 1 mg via INTRAVENOUS

## 2012-12-30 MED ORDER — DIPHENHYDRAMINE HCL 50 MG/ML IJ SOLN
INTRAMUSCULAR | Status: DC | PRN
  Administered 2012-12-30: 25 mg via INTRAVENOUS

## 2012-12-30 MED ORDER — PROPOFOL 10 MG/ML IV BOLUS
INTRAVENOUS | Status: DC | PRN
  Administered 2012-12-30: 200 mg via INTRAVENOUS

## 2012-12-30 MED ORDER — PANTOPRAZOLE SODIUM 40 MG PO TBEC
40.0000 mg | DELAYED_RELEASE_TABLET | Freq: Every day | ORAL | Status: DC
  Filled 2012-12-30: qty 1

## 2012-12-30 MED ORDER — SERTRALINE HCL 50 MG PO TABS
50.0000 mg | ORAL_TABLET | Freq: Every morning | ORAL | Status: DC
  Filled 2012-12-30: qty 1

## 2012-12-30 MED ORDER — LABETALOL HCL 5 MG/ML IV SOLN
INTRAVENOUS | Status: DC | PRN
  Administered 2012-12-30: 2 mg via INTRAVENOUS

## 2012-12-30 MED ORDER — OXYCODONE HCL 5 MG PO TABS: 5.0000 mg | ORAL_TABLET | Freq: Once | ORAL | Status: DC | PRN

## 2012-12-30 MED ORDER — 0.9 % SODIUM CHLORIDE (POUR BTL) OPTIME
TOPICAL | Status: DC | PRN
  Administered 2012-12-30: 1000 mL

## 2012-12-30 MED ORDER — OXYCODONE HCL 5 MG/5ML PO SOLN
5.0000 mg | Freq: Once | ORAL | Status: DC | PRN
  Filled 2012-12-30: qty 5

## 2012-12-30 MED ORDER — METOCLOPRAMIDE HCL 5 MG/ML IJ SOLN
INTRAMUSCULAR | Status: DC | PRN
  Administered 2012-12-30: 10 mg via INTRAVENOUS

## 2012-12-30 MED ORDER — DEXAMETHASONE SODIUM PHOSPHATE 4 MG/ML IJ SOLN
INTRAMUSCULAR | Status: DC | PRN
  Administered 2012-12-30: 10 mg via INTRAVENOUS

## 2012-12-30 MED ORDER — PROPOFOL INFUSION 10 MG/ML OPTIME
INTRAVENOUS | Status: DC | PRN
  Administered 2012-12-30: 140 ug/kg/min via INTRAVENOUS

## 2012-12-30 MED ORDER — ONDANSETRON HCL 4 MG/2ML IJ SOLN
INTRAMUSCULAR | Status: DC | PRN
  Administered 2012-12-30: 4 mg via INTRAVENOUS

## 2012-12-30 MED ORDER — CALCIUM CARBONATE 1250 (500 CA) MG PO TABS
1250.0000 mg | ORAL_TABLET | Freq: Three times a day (TID) | ORAL | Status: DC
  Administered 2012-12-30 – 2012-12-31 (×2): 1250 mg via ORAL
  Filled 2012-12-30 (×5): qty 1

## 2012-12-30 MED ORDER — ONDANSETRON HCL 4 MG PO TABS: 4.0000 mg | ORAL_TABLET | Freq: Four times a day (QID) | ORAL | Status: DC | PRN

## 2012-12-30 MED ORDER — HYDRALAZINE HCL 20 MG/ML IJ SOLN
INTRAMUSCULAR | Status: DC | PRN
  Administered 2012-12-30: 2 mg via INTRAVENOUS

## 2012-12-30 MED ORDER — PROMETHAZINE HCL 25 MG/ML IJ SOLN: 12.5000 mg | INTRAMUSCULAR | Status: DC | PRN

## 2012-12-30 MED ORDER — ACETAMINOPHEN 10 MG/ML IV SOLN: 1000.0000 mg | Freq: Once | INTRAVENOUS | Status: DC | PRN

## 2012-12-30 MED ORDER — DILTIAZEM HCL ER COATED BEADS 120 MG PO CP24
120.0000 mg | ORAL_CAPSULE | Freq: Every morning | ORAL | Status: DC
  Filled 2012-12-30: qty 1

## 2012-12-30 MED ORDER — ONDANSETRON HCL 4 MG/2ML IJ SOLN
4.0000 mg | Freq: Four times a day (QID) | INTRAMUSCULAR | Status: DC | PRN
  Administered 2012-12-30: 4 mg via INTRAVENOUS
  Filled 2012-12-30: qty 2

## 2012-12-30 MED ORDER — CEFAZOLIN SODIUM-DEXTROSE 2-3 GM-% IV SOLR
2.0000 g | INTRAVENOUS | Status: AC
  Administered 2012-12-30: 2 g via INTRAVENOUS

## 2012-12-30 MED ORDER — ALPRAZOLAM 0.5 MG PO TABS: 0.5000 mg | ORAL_TABLET | Freq: Three times a day (TID) | ORAL | Status: DC | PRN

## 2012-12-30 MED ORDER — ACETAMINOPHEN 10 MG/ML IV SOLN
INTRAVENOUS | Status: DC | PRN
  Administered 2012-12-30: 1000 mg via INTRAVENOUS

## 2012-12-30 MED ORDER — EXEMESTANE 25 MG PO TABS
25.0000 mg | ORAL_TABLET | Freq: Every morning | ORAL | Status: DC
  Filled 2012-12-30 (×2): qty 1

## 2012-12-30 MED ORDER — HYDROMORPHONE HCL PF 1 MG/ML IJ SOLN
INTRAMUSCULAR | Status: AC
  Filled 2012-12-30: qty 1

## 2012-12-30 MED ORDER — MEPERIDINE HCL 50 MG/ML IJ SOLN: 6.2500 mg | INTRAMUSCULAR | Status: DC | PRN

## 2012-12-30 MED ORDER — KETAMINE HCL 50 MG/ML IJ SOLN
INTRAMUSCULAR | Status: DC | PRN
  Administered 2012-12-30: 2 mg via INTRAMUSCULAR
  Administered 2012-12-30: 50 mg via INTRAVENOUS
  Administered 2012-12-30 (×2): 4 mg via INTRAMUSCULAR

## 2012-12-30 MED ORDER — KCL IN DEXTROSE-NACL 20-5-0.45 MEQ/L-%-% IV SOLN
INTRAVENOUS | Status: AC
  Filled 2012-12-30: qty 1000

## 2012-12-30 MED ORDER — KCL IN DEXTROSE-NACL 20-5-0.45 MEQ/L-%-% IV SOLN
INTRAVENOUS | Status: DC
  Administered 2012-12-30 – 2012-12-31 (×2): via INTRAVENOUS
  Filled 2012-12-30 (×2): qty 1000

## 2012-12-30 MED ORDER — TRIAZOLAM 0.25 MG PO TABS
0.2500 mg | ORAL_TABLET | Freq: Every day | ORAL | Status: DC
  Administered 2012-12-30: 0.25 mg via ORAL
  Filled 2012-12-30: qty 1

## 2012-12-30 MED ORDER — LACTATED RINGERS IV SOLN
INTRAVENOUS | Status: DC
  Administered 2012-12-30: 11:00:00 via INTRAVENOUS
  Administered 2012-12-30: 1000 mL via INTRAVENOUS

## 2012-12-30 MED ORDER — HYDROMORPHONE HCL PF 1 MG/ML IJ SOLN
0.2500 mg | INTRAMUSCULAR | Status: DC | PRN
  Administered 2012-12-30 (×3): 0.5 mg via INTRAVENOUS

## 2012-12-30 MED ORDER — EPHEDRINE SULFATE 50 MG/ML IJ SOLN
INTRAMUSCULAR | Status: DC | PRN
  Administered 2012-12-30: 5 mg via INTRAVENOUS

## 2012-12-30 MED ORDER — HYDROMORPHONE HCL PF 1 MG/ML IJ SOLN
1.0000 mg | INTRAMUSCULAR | Status: DC | PRN
  Administered 2012-12-30 – 2012-12-31 (×3): 1 mg via INTRAVENOUS
  Filled 2012-12-30 (×3): qty 1

## 2012-12-30 MED ORDER — SCOPOLAMINE 1 MG/3DAYS TD PT72
1.0000 | MEDICATED_PATCH | Freq: Once | TRANSDERMAL | Status: DC
  Administered 2012-12-30: 1.5 mg via TRANSDERMAL

## 2012-12-30 MED ORDER — PROMETHAZINE HCL 25 MG/ML IJ SOLN: 6.2500 mg | INTRAMUSCULAR | Status: DC | PRN

## 2012-12-30 SURGICAL SUPPLY — 40 items
APL SKNCLS STERI-STRIP NONHPOA (GAUZE/BANDAGES/DRESSINGS) ×1
ATTRACTOMAT 16X20 MAGNETIC DRP (DRAPES) ×2 IMPLANT
BENZOIN TINCTURE PRP APPL 2/3 (GAUZE/BANDAGES/DRESSINGS) ×2 IMPLANT
BLADE HEX COATED 2.75 (ELECTRODE) ×2 IMPLANT
BLADE SURG 15 STRL LF DISP TIS (BLADE) ×1 IMPLANT
BLADE SURG 15 STRL SS (BLADE) ×2
CANISTER SUCTION 2500CC (MISCELLANEOUS) ×2 IMPLANT
CHLORAPREP W/TINT 10.5 ML (MISCELLANEOUS) ×2 IMPLANT
CLIP TI MEDIUM 6 (CLIP) ×4 IMPLANT
CLIP TI WIDE RED SMALL 6 (CLIP) ×4 IMPLANT
CLOTH BEACON ORANGE TIMEOUT ST (SAFETY) ×2 IMPLANT
CLSR STERI-STRIP ANTIMIC 1/2X4 (GAUZE/BANDAGES/DRESSINGS) ×1 IMPLANT
DISSECTOR ROUND CHERRY 3/8 STR (MISCELLANEOUS) IMPLANT
DRAPE PED LAPAROTOMY (DRAPES) ×2 IMPLANT
DRESSING SURGICEL FIBRLLR 1X2 (HEMOSTASIS) ×1 IMPLANT
DRSG SURGICEL FIBRILLAR 1X2 (HEMOSTASIS) ×2
ELECT REM PT RETURN 9FT ADLT (ELECTROSURGICAL) ×2
ELECTRODE REM PT RTRN 9FT ADLT (ELECTROSURGICAL) ×1 IMPLANT
GAUZE SPONGE 4X4 16PLY XRAY LF (GAUZE/BANDAGES/DRESSINGS) ×2 IMPLANT
GLOVE SURG ORTHO 8.0 STRL STRW (GLOVE) ×2 IMPLANT
GOWN STRL NON-REIN LRG LVL3 (GOWN DISPOSABLE) ×2 IMPLANT
GOWN STRL REIN XL XLG (GOWN DISPOSABLE) ×4 IMPLANT
KIT BASIN OR (CUSTOM PROCEDURE TRAY) ×2 IMPLANT
NS IRRIG 1000ML POUR BTL (IV SOLUTION) ×2 IMPLANT
PACK BASIC VI WITH GOWN DISP (CUSTOM PROCEDURE TRAY) ×2 IMPLANT
PENCIL BUTTON HOLSTER BLD 10FT (ELECTRODE) ×2 IMPLANT
SHEARS HARMONIC 9CM CVD (BLADE) ×2 IMPLANT
SPONGE GAUZE 4X4 12PLY (GAUZE/BANDAGES/DRESSINGS) ×1 IMPLANT
STAPLER VISISTAT 35W (STAPLE) ×2 IMPLANT
STRIP CLOSURE SKIN 1/2X4 (GAUZE/BANDAGES/DRESSINGS) ×2 IMPLANT
SUT MNCRL AB 4-0 PS2 18 (SUTURE) ×2 IMPLANT
SUT SILK 2 0 (SUTURE) ×2
SUT SILK 2-0 18XBRD TIE 12 (SUTURE) ×1 IMPLANT
SUT SILK 3 0 (SUTURE)
SUT SILK 3-0 18XBRD TIE 12 (SUTURE) IMPLANT
SUT VIC AB 3-0 SH 18 (SUTURE) ×3 IMPLANT
SYR BULB IRRIGATION 50ML (SYRINGE) ×2 IMPLANT
TAPE CLOTH SURG 4X10 WHT LF (GAUZE/BANDAGES/DRESSINGS) ×1 IMPLANT
TOWEL OR 17X26 10 PK STRL BLUE (TOWEL DISPOSABLE) ×2 IMPLANT
YANKAUER SUCT BULB TIP 10FT TU (MISCELLANEOUS) ×2 IMPLANT

## 2012-12-30 NOTE — Preoperative (Signed)
Beta Blockers   Reason not to administer Beta Blockers:Not Applicable, not on home BB 

## 2012-12-30 NOTE — H&P (Signed)
[name] is an 57 y.o. female.    General Surgery [facility]  Chief Complaint: thyroid nodules with atypia  HPI: Patient is a 57 year old white female referred by her medical oncologist and primary care physician for evaluation of a left-sided thyroid nodule. A multinodular thyroid gland was identified in early 2013. Patient underwent thyroid ultrasound in April 2013 which showed a dominant 1.1 cm nodule in the inferior left lobe. Six-month followup thyroid ultrasound showed slight interval enlargement to 1.4 cm. 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 There was also atypia in the follicular cells with nuclear grooves and nuclear overlap. Patient is now referred for recommendations regarding management.  Patient has no prior history of thyroid disease. She has never been on thyroid medication. She has had no surgery on the head or neck. There is no family history of thyroid cancer. There is no family history of other endocrinopathy.   Past Medical History  Diagnosis Date  . GERD (gastroesophageal reflux disease)   . Anxiety   . SVT (supraventricular tachycardia)   . Osteoporosis   . Osteoporosis   . PONV (postoperative nausea and vomiting)   . Laryngitis 12-20-12    recent episode is resolving with Amoxicillin orally  . Cancer     left breast-(12-12-09 radiation and surgery)-Dr. Darnelle Catalan -oncology  . Arthritis     lower back and thumbs  . Thyroid adenoma 12-20-12    surgery planned 12-30-12    Past Surgical History  Procedure Laterality Date  . Breast surgery      left breast lumpectomy  . Eye surgery  12-20-12    right eye retina tear repair    Family History  Problem Relation Age of Onset  . Stroke Mother   . Heart disease Father   . Cancer Brother     melanoma   Social History:  reports that she quit smoking about 2 years ago. Her smoking use included Cigarettes. She smoked 0.00 packs  per day. She does not have any smokeless tobacco history on file. She reports that  drinks alcohol. She reports that she does not use illicit drugs.  Allergies:  Allergies  Allergen Reactions  . Sulfa Drugs Cross Reactors Hives    Medications Prior to Admission  Medication Sig Dispense Refill  . ALPRAZolam (XANAX) 0.5 MG tablet Take 0.5 mg by mouth 3 (three) times daily as needed for anxiety.       Marland Kitchen amoxicillin-clavulanate (AUGMENTIN) 875-125 MG per tablet Take 1 tablet by mouth 2 (two) times daily. She is taking for 10 days. She started on 12/13/12 and has not completed.      Marland Kitchen CLOBEX SPRAY 0.05 % external spray Apply 1 application topically 2 (two) times daily as needed (Applies to scalp, forehead and elbows for psoriasis.).       Marland Kitchen diltiazem (CARDIZEM CD) 120 MG 24 hr capsule Take 120 mg by mouth every morning.       Marland Kitchen esomeprazole (NEXIUM) 40 MG capsule Take 40 mg by mouth every morning.       Marland Kitchen exemestane (AROMASIN) 25 MG tablet Take 25 mg by mouth every morning.      . sertraline (ZOLOFT) 50 MG tablet Take 50 mg by mouth every morning.       . sodium chloride (OCEAN) 0.65 % SOLN nasal spray Place 1 spray into the nose at bedtime.      . triazolam (HALCION) 0.25 MG tablet Take  0.25 mg by mouth at bedtime.       Marland Kitchen acetaminophen (TYLENOL) 500 MG tablet Take 500 mg by mouth every 6 (six) hours as needed for pain.      . Calcium Carbonate-Vitamin D (CALCIUM 600 + D PO) Take 1 tablet by mouth 2 (two) times daily.      . Flaxseed, Linseed, (FLAXSEED OIL PO) Take 1 tablet by mouth every morning.      . Multiple Vitamins-Minerals (MULTIVITAMINS THER. W/MINERALS) TABS Take 1 tablet by mouth every morning.       . traMADol (ULTRAM) 50 MG tablet Take 50 mg by mouth every 6 (six) hours as needed for pain. Maximum dose= 8 tablets per day.        No results found for this or any previous visit (from the past 48 hour(s)). No results found.  Review of Systems  Constitutional: Negative.   HENT:  Negative.   Eyes: Negative.   Respiratory: Negative.   Cardiovascular: Negative.   Gastrointestinal: Negative.   Genitourinary: Negative.   Musculoskeletal: Negative.   Skin: Negative.   Neurological: Negative.   Endo/Heme/Allergies: Negative.   Psychiatric/Behavioral: Negative.     Blood pressure 134/87, pulse 84, temperature 98.3 F (36.8 C), temperature source Oral, resp. rate 18, SpO2 97.00%. Physical Exam  Constitutional: She is oriented to person, place, and time. She appears well-developed and well-nourished. No distress.  HENT:  Head: Normocephalic and atraumatic.  Right Ear: External ear normal.  Left Ear: External ear normal.  Mouth/Throat: Oropharynx is clear and moist.  Eyes: Conjunctivae are normal. Pupils are equal, round, and reactive to light. No scleral icterus.  Neck: Normal range of motion. Neck supple. No tracheal deviation present. No thyromegaly present.  Cardiovascular: Normal rate, regular rhythm and normal heart sounds.   Respiratory: Effort normal and breath sounds normal. She has no wheezes.  GI: Soft. Bowel sounds are normal. She exhibits no distension.  Musculoskeletal: Normal range of motion. She exhibits no edema.  Lymphadenopathy:    She has no cervical adenopathy.  Neurological: She is alert and oriented to person, place, and time.  Skin: Skin is warm and dry.  Psychiatric: She has a normal mood and affect. Her behavior is normal.     Assessment/Plan Multinodular thyroid with cytologic atypia (Hurthle cell neoplasm)  Plan total thyroidectomy  The risks and benefits of the procedure have been discussed at length with the patient.  The patient understands the proposed procedure, potential alternative treatments, and the course of recovery to be expected.  All of the patient's questions have been answered at this time.  The patient wishes to proceed with surgery.  Velora Heckler, MD, FACS General & Endocrine Surgery [facility] Office: [phone]   Shiloh Swopes Judie Petit 12/30/2012, 9:51 AM

## 2012-12-30 NOTE — Anesthesia Preprocedure Evaluation (Addendum)
Anesthesia Evaluation  Patient identified by MRN, date of birth, ID band Patient awake    Reviewed: Allergy & Precautions, H&P , NPO status , Patient's Chart, lab work & pertinent test results  History of Anesthesia Complications (+) PONV  Airway Mallampati: II TM Distance: >3 FB Neck ROM: Full    Dental  (+) Dental Advisory Given and Teeth Intact   Pulmonary neg pulmonary ROS,  breath sounds clear to auscultation        Cardiovascular negative cardio ROS  + dysrhythmias Supra Ventricular Tachycardia Rhythm:Regular Rate:Normal     Neuro/Psych PSYCHIATRIC DISORDERS Anxiety negative neurological ROS     GI/Hepatic Neg liver ROS, GERD-  Medicated,  Endo/Other  negative endocrine ROS  Renal/GU negative Renal ROS     Musculoskeletal negative musculoskeletal ROS (+)   Abdominal   Peds  Hematology negative hematology ROS (+)   Anesthesia Other Findings   Reproductive/Obstetrics                          Anesthesia Physical Anesthesia Plan  ASA: II  Anesthesia Plan: General   Post-op Pain Management:    Induction: Intravenous  Airway Management Planned: Oral ETT  Additional Equipment:   Intra-op Plan:   Post-operative Plan: Extubation in OR  Informed Consent: I have reviewed the patients History and Physical, chart, labs and discussed the procedure including the risks, benefits and alternatives for the proposed anesthesia with the patient or authorized representative who has indicated his/her understanding and acceptance.   Dental advisory given  Plan Discussed with: CRNA  Anesthesia Plan Comments:         Anesthesia Quick Evaluation

## 2012-12-30 NOTE — Transfer of Care (Signed)
Immediate Anesthesia Transfer of Care Note  Patient: [name]  Procedure(s) Performed: Procedure(s): THYROIDECTOMY (N/A)  Patient Location: PACU  Anesthesia Type:General  Level of Consciousness: awake, patient cooperative and responds to stimulation, drowsy, ventilating well  Airway & Oxygen Therapy: Patient Spontanous Breathing and Patient connected to face mask oxygen  Post-op Assessment: Report given to PACU RN, Post -op Vital signs reviewed and stable and Patient moving all extremities  Post vital signs: Reviewed and stable  Complications: No apparent anesthesia complications

## 2012-12-30 NOTE — Anesthesia Postprocedure Evaluation (Signed)
Anesthesia Post Note  Patient: [name]  Procedure(s) Performed: Procedure(s) (LRB): THYROIDECTOMY (N/A)  Anesthesia type: General  Patient location: PACU  Post pain: Pain level controlled  Post assessment: Post-op Vital signs reviewed  Last Vitals: BP 168/81  Pulse 72  Temp(Src) 36.4 C (Oral)  Resp 13  SpO2 99%  Post vital signs: Reviewed  Level of consciousness: sedated  Complications: No apparent anesthesia complications

## 2012-12-30 NOTE — Brief Op Note (Signed)
12/30/2012  11:45 AM  PATIENT:  Thomasena Edis Milbrath  57 y.o. female  PRE-OPERATIVE DIAGNOSIS:  thyroid neoplasia of uncertian behaviour   POST-OPERATIVE DIAGNOSIS:  thyroid neoplasia of uncertian behaviour   PROCEDURE:  Total thyroidectomy   SURGEON:  Surgeon(s) and Role:    * Velora Heckler, MD - Primary    * Adolph Pollack, MD - Assisting  ANESTHESIA:   general  EBL:  Total I/O In: 1000 [I.V.:1000] Out: -   BLOOD ADMINISTERED:none  DRAINS: none   LOCAL MEDICATIONS USED:  NONE  SPECIMEN:  Excision  DISPOSITION OF SPECIMEN:  PATHOLOGY  COUNTS:  YES  TOURNIQUET:  * No tourniquets in log *  DICTATION: .Other Dictation: Dictation Number [id number]  PLAN OF CARE: Admit for overnight observation  PATIENT DISPOSITION:  PACU - hemodynamically stable.   Delay start of Pharmacological VTE agent (>24hrs) due to surgical blood loss or risk of bleeding: yes  Velora Heckler, MD, FACS General & Endocrine Surgery [facility] Office: [phone]

## 2012-12-31 LAB — BASIC METABOLIC PANEL
CO2: 27 mEq/L (ref 19–32)
Calcium: 9.2 mg/dL (ref 8.4–10.5)
Chloride: 108 mEq/L (ref 96–112)
Glucose, Bld: 110 mg/dL — ABNORMAL HIGH (ref 70–99)
Potassium: 4.1 mEq/L (ref 3.5–5.1)
Sodium: 141 mEq/L (ref 135–145)

## 2012-12-31 MED ORDER — SYNTHROID 100 MCG PO TABS: 100.0000 ug | ORAL_TABLET | Freq: Every day | ORAL | Status: DC

## 2012-12-31 MED ORDER — CALCIUM CARBONATE 1250 (500 CA) MG PO CAPS: 1250.0000 mg | ORAL_CAPSULE | Freq: Two times a day (BID) | ORAL | Status: DC

## 2012-12-31 MED ORDER — HYDROCODONE-ACETAMINOPHEN 5-325 MG PO TABS: 1.0000 | ORAL_TABLET | ORAL | Status: DC | PRN

## 2012-12-31 NOTE — Discharge Summary (Signed)
Physician Discharge Summary [facility]  Patient ID: [name] MRN: [id number] DOB/AGE: Mar 16, 1956 57 y.o.  Admit date: 12/30/2012 Discharge date: 12/31/2012  Admission Diagnoses:  Multinodular thyroid with atypia  Discharge Diagnoses:  Principal Problem:   Neoplasm of uncertain behavior of thyroid gland, left lobe   Discharged Condition: good  Hospital Course: patient admitted for observation after thyroidectomy.  Stable post op.  Nausea resolved.  Calcium level stable AM after surgery.  Prepared for discharge POD#1.  Consults: None  Significant Diagnostic Studies: labs: calcium  Treatments: surgery: total thyroidectomy  Discharge Exam: Blood pressure 106/65, pulse 69, temperature 98.2 F (36.8 C), temperature source Oral, resp. rate 18, height 5\' 6"  (1.676 m), weight 238 lb 4 oz (108.069 kg), SpO2 95.00%. HEENT - clear Neck - incision clear and dry; mild STS; voice normal Chest - clear Cor - RRR  Disposition: Home with family  Discharge Orders   Future Appointments Provider Department Dept Phone   03/15/2013 2:30 PM [facility] [phone]   03/28/2013 10:45 AM Amy Allegra Grana, PA [facility] [phone]   Future Orders Complete By Expires     Apply dressing  As directed     Comments:      Apply light gauze dressing to neck before discharge home today.    Diet - low sodium heart healthy  As directed     Discharge instructions  As directed     Comments:      THYROID & PARATHYROID SURGERY - POST OP INSTRUCTIONS  Always review your discharge instruction sheet from the facility where your surgery was performed.  A prescription for pain medication may be given to you upon discharge.  Take your pain medication as prescribed.  If narcotic pain medicine is not needed, then you may take acetaminophen (Tylenol) or ibuprofen (Advil) as needed.  Take your usually prescribed  medications unless otherwise directed.  If you need a refill on your pain medication, please contact your pharmacy. They will contact our office to request authorization.  Prescriptions will not be processed after 5 pm or on weekends.  Start with a light diet upon arrival home, such as soup and crackers or toast.  Be sure to drink plenty of fluids daily.  Resume your normal diet the day after surgery.  Most patients will experience some swelling and bruising on the chest and neck area.  Ice packs will help.  Swelling and bruising can take several days to resolve.   It is common to experience some constipation if taking pain medication after surgery.  Increasing fluid intake and taking a stool softener will usually help or prevent this problem.  A mild laxative (Milk of Magnesia or Miralax) should be taken according to package directions if there are no bowel movements after 48 hours.  You may remove your bandages 24-48 hours after surgery, and you may shower at that time.  You have steri-strips (small skin tapes) in place directly over the incision.  These strips should be left on the skin for 7-10 days and then removed.  You may resume regular (light) daily activities beginning the next day-such as daily self-care, walking, climbing stairs-gradually increasing activities as tolerated.  You may have sexual intercourse when it is comfortable.  Refrain from any heavy lifting or straining until approved by your doctor.  You may drive when you no longer are taking prescription pain medication, you can comfortably wear a seatbelt,  and you can safely maneuver your car and apply brakes.  You should see your doctor in the office for a follow-up appointment approximately two to three weeks after your surgery.  Make sure that you call for this appointment within a day or two after you arrive home to insure a convenient appointment time.  WHEN TO CALL YOUR DOCTOR: -- Fever greater than 101.5 -- Inability to  urinate -- Nausea and/or vomiting - persistent -- Extreme swelling or bruising -- Continued bleeding from incision -- Increased pain, redness, or drainage from the incision -- Difficulty swallowing or breathing -- Muscle cramping or spasms -- Numbness or tingling in hands or around lips  The clinic staff is available to answer your questions during regular business hours.  Please don't hesitate to call and ask to speak to one of the nurses if you have concerns.  Velora Heckler, MD, FACS General & Endocrine Surgery [facility] Office: [phone]    Increase activity slowly  As directed     Remove dressing in 24 hours  As directed         Medication List    TAKE these medications       acetaminophen 500 MG tablet  Commonly known as:  TYLENOL  Take 500 mg by mouth every 6 (six) hours as needed for pain.     ALPRAZolam 0.5 MG tablet  Commonly known as:  XANAX  Take 0.5 mg by mouth 3 (three) times daily as needed for anxiety.     amoxicillin-clavulanate 875-125 MG per tablet  Commonly known as:  AUGMENTIN  Take 1 tablet by mouth 2 (two) times daily. She is taking for 10 days. She started on 12/13/12 and has not completed.     CALCIUM 600 + D PO  Take 1 tablet by mouth 2 (two) times daily.     calcium carbonate 1250 MG capsule  Take 1 capsule (1,250 mg total) by mouth 2 (two) times daily with a meal.     CLOBEX SPRAY 0.05 % external spray  Generic drug:  Clobetasol Propionate  Apply 1 application topically 2 (two) times daily as needed (Applies to scalp, forehead and elbows for psoriasis.).     diltiazem 120 MG 24 hr capsule  Commonly known as:  CARDIZEM CD  Take 120 mg by mouth every morning.     esomeprazole 40 MG capsule  Commonly known as:  NEXIUM  Take 40 mg by mouth every morning.     exemestane 25 MG tablet  Commonly known as:  AROMASIN  Take 25 mg by mouth every morning.     FLAXSEED OIL PO  Take 1 tablet by mouth every morning.      HYDROcodone-acetaminophen 5-325 MG per tablet  Commonly known as:  NORCO/VICODIN  Take 1-2 tablets by mouth every 4 (four) hours as needed for pain.     multivitamins ther. w/minerals Tabs  Take 1 tablet by mouth every morning.     sertraline 50 MG tablet  Commonly known as:  ZOLOFT  Take 50 mg by mouth every morning.     sodium chloride 0.65 % Soln nasal spray  Commonly known as:  OCEAN  Place 1 spray into the nose at bedtime.     SYNTHROID 100 MCG tablet  Generic drug:  levothyroxine  Take 1 tablet (100 mcg total) by mouth daily.     traMADol 50 MG tablet  Commonly known as:  ULTRAM  Take 50 mg by mouth every 6 (  six) hours as needed for pain. Maximum dose= 8 tablets per day.     triazolam 0.25 MG tablet  Commonly known as:  HALCION  Take 0.25 mg by mouth at bedtime.         Velora Heckler, MD, [facility] Office: [phone]   Signed: Velora Heckler 12/31/2012, 8:48 AM

## 2012-12-31 NOTE — Op Note (Signed)
[name], [name]          ACCOUNT NO.:  [id number]  MEDICAL RECORD NO.:  [id number]  LOCATION:  1532                         FACILITY:  [facility]  PHYSICIAN:  Velora Heckler, MD      DATE OF BIRTH:  1956-03-21  DATE OF PROCEDURE:  12/30/2012                               OPERATIVE REPORT   PREOPERATIVE DIAGNOSIS:  Multiple thyroid nodules with cytologic atypia.  POSTOPERATIVE DIAGNOSIS:  Multiple thyroid nodules with cytologic atypia.  PROCEDURE:  Total thyroidectomy.  SURGEON:  Velora Heckler, MD, FACS  ASSISTANT:  Adolph Pollack, MD, FACS  ANESTHESIA:  General.  ESTIMATED BLOOD LOSS:  Minimal.  PREPARATION:  ChloraPrep.  COMPLICATIONS:  None.  INDICATIONS:  The patient is a 57 year old white female referred by her medical oncologist, Dr. Ruthann Cameron, and her primary care physician, Dr. Guerry Cameron, for evaluation of thyroid nodules.  The patient has a known multinodular thyroid followed with ultrasound.  There has been slight interval enlargement of a dominant nodule in the inferior left lobe.  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  There were also evidence of atypia with nuclear grooves and nuclear overlap.  The patient now comes to Surgery for resection for definitive diagnosis to rule out malignancy.  DESCRIPTION OF PROCEDURE:  Procedure was done in OR #1 at the [facility].  Patient was brought to the operating room, placed in a supine position on the operating room table.  Following administration of general anesthesia, the patient was positioned and then prepped and draped in the usual strict aseptic fashion.  After ascertaining that an adequate level of anesthesia had been achieved, a Kocher incision was made with a #15 blade.  Dissection was carried through subcutaneous tissues and platysma.  Hemostasis was obtained with electrocautery.  Skin flaps were elevated cephalad and caudad from  the thyroid notch to the sternal notch.  A Mahorner self-retaining retractor was placed for exposure.  Strap muscles were incised in the midline. Dissection was begun on the left side.  Left thyroid lobe is normal in size.  It contains multiple nodules.  It was gently mobilized.  Venous tributaries were divided between Ligaclips with the Harmonic scalpel. Inferior venous tributaries were dissected out and divided between medium Ligaclips with the Harmonic scalpel.  Superior pole vessels were mobilized, dissected out individually, and divided between Ligaclips with the Harmonic scalpel.  Gland was rolled anteriorly.  Parathyroid tissue was identified and preserved.  Recurrent laryngeal nerve was identified and preserved.  Branches of the inferior thyroid artery were divided between small Ligaclips with the Harmonic scalpel.  Ligament of Allyson Sabal was released with the electrocautery and the gland was mobilized onto the anterior trachea.  Isthmus was mobilized across the midline. There was a small residual pyramidal lobe, which was resected en bloc with the isthmus.  Dry pack was placed in the left neck.  Next, we turned our attention to the right thyroid lobe.  Likewise, it was normal in size but contains multiple thyroid nodules.  Strap muscles were reflected laterally.  Venous tributaries were divided between Ligaclips with the Harmonic scalpel.  Superior pole vessels were dissected out individually and divided between Ligaclips with the  Harmonic scalpel.  Parathyroid tissue was identified and preserved. Branches of the inferior thyroid artery were divided between small Ligaclips with the Harmonic scalpel.  Recurrent laryngeal nerve was identified and preserved.  Ligament of Allyson Sabal was released with the electrocautery and the gland was mobilized onto the anterior trachea from which it was completely resected.  Good hemostasis was noted.  A suture was used to mark the left superior pole of  the thyroid gland. The gland was submitted to Pathology for review.  The neck was irrigated with warm saline.  Good hemostasis was achieved. Fibrillar was placed throughout the operative field.  Strap muscles were reapproximated in the midline with interrupted 3-0 Vicryl sutures. Platysma was closed with interrupted 3-0 Vicryl sutures.  Skin was closed with a running 4-0 Monocryl subcuticular suture.  Wound was washed and dried and benzoin and Steri-Strips were applied.  Sterile dressings were applied.  Patient was awakened from anesthesia and brought to the recovery room.  The patient tolerated the procedure well.   Velora Heckler, MD, FACS General & Endocrine Surgery [facility] Office: [phone]   TMG/MEDQ  D:  12/30/2012  T:  12/31/2012  Job:  [id number]  cc:   Lowella Dell, M.D. Fax: [phone]  Gaspar Garbe, M.D. Fax: [phone]

## 2013-01-02 ENCOUNTER — Encounter (HOSPITAL_COMMUNITY): Payer: Self-pay | Admitting: Surgery

## 2013-01-02 ENCOUNTER — Other Ambulatory Visit (INDEPENDENT_AMBULATORY_CARE_PROVIDER_SITE_OTHER): Payer: Self-pay

## 2013-01-03 ENCOUNTER — Telehealth (INDEPENDENT_AMBULATORY_CARE_PROVIDER_SITE_OTHER): Payer: Self-pay

## 2013-01-03 NOTE — Progress Notes (Signed)
Quick Note:  Please contact patient and notify of benign pathology results.  Todd M. Gerkin, MD, FACS [facility] Office: [phone]   ______ 

## 2013-01-03 NOTE — Telephone Encounter (Signed)
Pt home doing well. Lab slip mailed to pt.

## 2013-01-04 ENCOUNTER — Ambulatory Visit (INDEPENDENT_AMBULATORY_CARE_PROVIDER_SITE_OTHER): Payer: 59 | Admitting: Surgery

## 2013-01-04 ENCOUNTER — Encounter (INDEPENDENT_AMBULATORY_CARE_PROVIDER_SITE_OTHER): Payer: Self-pay | Admitting: Surgery

## 2013-01-04 VITALS — BP 126/60 | HR 76 | Temp 97.7°F | Resp 16 | Ht 66.5 in | Wt 236.0 lb

## 2013-01-04 DIAGNOSIS — D449 Neoplasm of uncertain behavior of unspecified endocrine gland: Secondary | ICD-10-CM

## 2013-01-04 DIAGNOSIS — D440 Neoplasm of uncertain behavior of thyroid gland: Secondary | ICD-10-CM

## 2013-01-04 MED ORDER — CEPHALEXIN 500 MG PO CAPS: 500.0000 mg | ORAL_CAPSULE | Freq: Three times a day (TID) | ORAL | Status: AC

## 2013-01-04 NOTE — Progress Notes (Signed)
General Surgery [facility]  Visit Diagnoses: 1. Neoplasm of uncertain behavior of thyroid gland, left lobe     HISTORY: Patient returns today for wound check. She has noted some swelling and pain at the right side of her incision.  EXAM: Slight induration. Slight ecchymosis. Subtle erythema.  IMPRESSION: Status post total thyroidectomy. Concerning for early cellulitis of incision.  PLAN: I will start the patient on Keflex 500 mg 3 times daily for one week as per caution. I gave her the final results of her pathology which are completely benign.  Patient will return in 2-3 weeks for laboratory work and wound check.  Velora Heckler, MD, FACS General & Endocrine Surgery [facility]

## 2013-01-04 NOTE — Patient Instructions (Signed)
  COCOA BUTTER & VITAMIN E CREAM  ([employer]mer[employer] or other brand)  Apply cocoa butter/vitamin E cream to your incision 2 - 3 times daily.  Massage cream into incision for one minute with each application.  Use sunscreen (50 SPF or higher) for first 6 months after surgery if area is exposed to sun.  You may substitute Mederma or other scar reducing creams as desired.   

## 2013-01-08 ENCOUNTER — Other Ambulatory Visit: Payer: Self-pay | Admitting: Oncology

## 2013-01-08 ENCOUNTER — Encounter: Payer: Self-pay | Admitting: Oncology

## 2013-01-09 ENCOUNTER — Telehealth: Payer: Self-pay | Admitting: Oncology

## 2013-01-09 NOTE — Telephone Encounter (Signed)
Sent letter to patient from Dr. Magrinat. °

## 2013-01-23 ENCOUNTER — Encounter (INDEPENDENT_AMBULATORY_CARE_PROVIDER_SITE_OTHER): Payer: Self-pay | Admitting: Surgery

## 2013-01-23 ENCOUNTER — Ambulatory Visit (INDEPENDENT_AMBULATORY_CARE_PROVIDER_SITE_OTHER): Payer: 59 | Admitting: Surgery

## 2013-01-23 VITALS — BP 130/70 | HR 68 | Temp 98.6°F | Resp 18 | Ht 66.0 in | Wt 238.0 lb

## 2013-01-23 DIAGNOSIS — D440 Neoplasm of uncertain behavior of thyroid gland: Secondary | ICD-10-CM

## 2013-01-23 DIAGNOSIS — D449 Neoplasm of uncertain behavior of unspecified endocrine gland: Secondary | ICD-10-CM

## 2013-01-23 NOTE — Patient Instructions (Signed)
  COCOA BUTTER & VITAMIN E CREAM  ([employer]mer[employer] or other brand)  Apply cocoa butter/vitamin E cream to your incision 2 - 3 times daily.  Massage cream into incision for one minute with each application.  Use sunscreen (50 SPF or higher) for first 6 months after surgery if area is exposed to sun.  You may substitute Mederma or other scar reducing creams as desired.   

## 2013-01-23 NOTE — Progress Notes (Signed)
General Surgery [facility]  Visit Diagnoses: 1. Neoplasm of uncertain behavior of thyroid gland, left lobe     HISTORY: Patient returns for wound check 3 weeks after total thyroidectomy. She is taking Synthroid 100 mcg daily. Follow-up serum calcium level is normal at 9.8.  EXAM: Surgical wound is healing nicely. Slight soft tissue swelling. Erythema has resolved and there is no further sign of infection. Voice quality is normal.  IMPRESSION: Status post total thyroidectomy with benign final pathology  PLAN: Patient will continue applying topical creams to her incision. She will remain on her current dose of Synthroid. We will check a TSH level prior to her next office visit. She will return in 6 weeks.  Velora Heckler, MD, FACS General & Endocrine Surgery [facility]

## 2013-01-30 ENCOUNTER — Encounter (INDEPENDENT_AMBULATORY_CARE_PROVIDER_SITE_OTHER): Payer: Self-pay

## 2013-02-15 ENCOUNTER — Other Ambulatory Visit: Payer: Self-pay | Admitting: Obstetrics and Gynecology

## 2013-03-14 ENCOUNTER — Other Ambulatory Visit: Payer: Self-pay | Admitting: Physician Assistant

## 2013-03-14 DIAGNOSIS — C50912 Malignant neoplasm of unspecified site of left female breast: Secondary | ICD-10-CM

## 2013-03-15 ENCOUNTER — Other Ambulatory Visit (HOSPITAL_BASED_OUTPATIENT_CLINIC_OR_DEPARTMENT_OTHER): Payer: 59 | Admitting: Lab

## 2013-03-15 DIAGNOSIS — C50912 Malignant neoplasm of unspecified site of left female breast: Secondary | ICD-10-CM

## 2013-03-15 DIAGNOSIS — C50219 Malignant neoplasm of upper-inner quadrant of unspecified female breast: Secondary | ICD-10-CM

## 2013-03-15 LAB — COMPREHENSIVE METABOLIC PANEL (CC13)
ALT: 17 U/L (ref 0–55)
BUN: 10.8 mg/dL (ref 7.0–26.0)
CO2: 25 mEq/L (ref 22–29)
Calcium: 9.3 mg/dL (ref 8.4–10.4)
Chloride: 106 mEq/L (ref 98–107)
Creatinine: 1.1 mg/dL (ref 0.6–1.1)
Glucose: 111 mg/dl — ABNORMAL HIGH (ref 70–99)
Total Bilirubin: 0.44 mg/dL (ref 0.20–1.20)

## 2013-03-15 LAB — CBC WITH DIFFERENTIAL/PLATELET
Basophils Absolute: 0 10*3/uL (ref 0.0–0.1)
HCT: 39.8 % (ref 34.8–46.6)
HGB: 13.7 g/dL (ref 11.6–15.9)
LYMPH%: 35 % (ref 14.0–49.7)
MCHC: 34.4 g/dL (ref 31.5–36.0)
MONO#: 0.5 10*3/uL (ref 0.1–0.9)
NEUT%: 55.2 % (ref 38.4–76.8)
Platelets: 270 10*3/uL (ref 145–400)
WBC: 6.4 10*3/uL (ref 3.9–10.3)
lymph#: 2.2 10*3/uL (ref 0.9–3.3)

## 2013-03-20 ENCOUNTER — Ambulatory Visit (INDEPENDENT_AMBULATORY_CARE_PROVIDER_SITE_OTHER): Payer: 59 | Admitting: Surgery

## 2013-03-21 ENCOUNTER — Other Ambulatory Visit: Payer: 59

## 2013-03-28 ENCOUNTER — Ambulatory Visit (HOSPITAL_BASED_OUTPATIENT_CLINIC_OR_DEPARTMENT_OTHER): Payer: 59 | Admitting: Physician Assistant

## 2013-03-28 ENCOUNTER — Telehealth: Payer: Self-pay | Admitting: *Deleted

## 2013-03-28 ENCOUNTER — Encounter: Payer: Self-pay | Admitting: Physician Assistant

## 2013-03-28 VITALS — BP 130/88 | HR 79 | Temp 97.6°F | Resp 20 | Ht 66.0 in | Wt 235.5 lb

## 2013-03-28 DIAGNOSIS — C50919 Malignant neoplasm of unspecified site of unspecified female breast: Secondary | ICD-10-CM

## 2013-03-28 DIAGNOSIS — N649 Disorder of breast, unspecified: Secondary | ICD-10-CM

## 2013-03-28 DIAGNOSIS — C50912 Malignant neoplasm of unspecified site of left female breast: Secondary | ICD-10-CM

## 2013-03-28 NOTE — Telephone Encounter (Signed)
appts was made and printed. Pt will call and schedule her appt with Dr. Nita Sells due to she is already a pt there...td

## 2013-03-28 NOTE — Progress Notes (Signed)
ID: [name]   DOB: 1956-02-02  MR#: [id number]  WUJ#:[id number]  PCP: Gaspar Garbe, MD GYN: Miguel Aschoff SU: Claud Kelp MD OTHER MD: Will Kellie Simmering, Consuela Mimes, Mercie Eon  HISTORY OF PRESENT ILLNESS: [name] has a history of fibrocystic change.  She took hormone replacement for many years and also vitamin E, but weaned herself off these medications around November 2010.  After a little while she started noticing discomfort in the left breast.  She went back on vitamin E, but this does not take care of the problem and she was evaluated by Dr. Tenny Craw who felt that repeat mammogram should be obtained, even though it was not quite a year from the last one.  [name] found the breast to be predominantly fatty, but in the area of the palpable abnormality there was a circumscribed lobular mass with slightly irregular margins.  The ultrasound showed a lobular septated cyst measuring 2.7 cm and other cysts were also noted corresponding to the palpable mass.  There was in addition a 4 mm area of hypoechoic shadowing that corresponded to the nodular component seen on mammography.  Biopsy of this new mass was recommended and performed by Kerrin Mo at the breast center on December 19, 2009.  This showed ([id number]) an invasive ductal carcinoma in the setting of fibrocystic change.  This appeared to be low-grade and the tumor was strongly positive for ER and PR at 95 and 99% respectively.  It had the tumor cells at a low proliferation marker at 7%, HER2 was not amplified with CISH ratio of 1.43.  Bilateral breast MRIs were obtained on December 26, 2009.  This showed a solitary 1.1 cm area of the regular enhancement in the upper inner left breast.  There were of course cluster of cysts also noted.  This was a secondary that had been biopsied on December 19, 2009 and had shown only fibrocystic change.  With this information after appropriate discussion Dr. Derrell Lolling proceeded to  left lumpectomy and sentinel lymph node sampling January 02, 2010.  The pathology from this procedure ([id number]) showed a 9 mm invasive ductal carcinoma, grade 2 with clear margins and no evidence of angiolymphatic invasion.  Two sentinel lymph nodes were clear. Her subsequent history is as detailed below  INTERVAL HISTORY: [name] returns today  for routine six-month followup of her left breast carcinoma. She continues on exemestane which she tolerates reasonably well. It does cause some joint pain, but she tells me it is the "best tolerated" of all of the anti-estrogens she has tried.  When the joint pain becomes significant, she continues on the exemestane but takes it every other day for a week or so, then returns to a daily dose.  Interval history is remarkable for [name] having undergone total thyroidectomy on 12/30/2012 under the care of Dr. Gerrit Friends.  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     REVIEW OF SYSTEMS: [name] has had no recent illnesses and denies any fevers, chills, night sweats, or hot flashes. She continues to have vaginal dryness. She still feels tired, and continues to be followed by Dr.  Gerrit Friends with regards to her thyroid. She's eating and drinking well and has had no nausea or change in bowel or bladder habits. She's had no cough, shortness of breath, chest pain, or palpitations. She denies any abnormal headaches or dizziness. She also denies any unusual myalgias, arthralgias, or bony pain, other than the joint pain associated with the exemestane as noted above.  A detailed review of systems is otherwise stable and noncontributory.   PAST MEDICAL HISTORY: Past Medical History  Diagnosis Date  . GERD (gastroesophageal reflux disease)   . Anxiety   . SVT (supraventricular tachycardia)   . Osteoporosis   . Osteoporosis   . PONV (postoperative nausea and vomiting)   . Laryngitis 12-20-12    recent episode is resolving with Amoxicillin orally  . Cancer      left breast-(12-12-09 radiation and surgery)-Dr. Darnelle Catalan -oncology  . Arthritis     lower back and thumbs  . Thyroid adenoma 12-20-12    surgery planned 12-30-12    PAST SURGICAL HISTORY: Past Surgical History  Procedure Laterality Date  . Breast surgery      left breast lumpectomy  . Eye surgery  12-20-12    right eye retina tear repair  . Thyroidectomy N/A 12/30/2012    Procedure: THYROIDECTOMY;  Surgeon: Velora Heckler, MD;  Location: [facility];  Service: General;  Laterality: N/A;    FAMILY HISTORY Family History  Problem Relation Age of Onset  . Stroke Mother   . Heart disease Father   . Cancer Brother     melanoma   The patient's father died from cardiac arrest at the age of 57.  The patient's mother died at the age of 73 from a stroke.  The patient has one brother who died from melanoma at the age of 42.  GYNECOLOGIC HISTORY:  She is GX, P0.  Last menstrual period 2004.  She took hormone replacement until November 2010.    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  They have a dog at home.  They are not church attenders.  ADVANCED DIRECTIVES: In place  HEALTH MAINTENANCE: History  Substance Use Topics  . Smoking status: Former Smoker    Types: Cigarettes    Quit date: 01/31/2010  . Smokeless tobacco: Never Used  . Alcohol Use: Yes     Comment: social- occ     Colonoscopy:  Due in 2016  PAP:  Bone density:  Lipid panel:  Allergies  Allergen Reactions  . Sulfa Drugs Cross Reactors Hives    Current Outpatient Prescriptions  Medication Sig Dispense Refill  . acetaminophen (TYLENOL) 500 MG tablet Take 500 mg by mouth every 6 (six) hours as needed for pain.      Marland Kitchen ALPRAZolam (XANAX) 0.5 MG tablet Take 0.5 mg by mouth 3 (three) times daily as needed for anxiety.       Marland Kitchen aspirin 81 MG tablet Take 81 mg by mouth daily.      . calcium carbonate 1250 MG capsule Take 1  capsule (1,250 mg total) by mouth 2 (two) times daily with a meal.  60 capsule  2  . Calcium Carbonate-Vitamin D (CALCIUM 600 + D PO) Take 1 tablet by mouth 2 (two) times daily.      . cetirizine (ZYRTEC) 10 MG tablet Take 10 mg by mouth daily.      Marland Kitchen CLOBEX SPRAY 0.05 % external spray Apply 1 application topically 2 (two) times daily as needed (Applies to scalp, forehead and elbows for psoriasis.).       Marland Kitchen diltiazem (CARDIZEM CD) 120 MG 24 hr capsule Take 120 mg by mouth every morning.       Marland Kitchen esomeprazole (NEXIUM) 40 MG capsule Take 40 mg by mouth every morning.       Marland Kitchen  exemestane (AROMASIN) 25 MG tablet Take 25 mg by mouth every morning.      . Flaxseed, Linseed, (FLAXSEED OIL PO) Take 1 tablet by mouth every morning.      . Multiple Vitamins-Minerals (MULTIVITAMINS THER. W/MINERALS) TABS Take 1 tablet by mouth every morning.       . sertraline (ZOLOFT) 50 MG tablet Take 50 mg by mouth every morning.       . sodium chloride (OCEAN) 0.65 % SOLN nasal spray Place 1 spray into the nose at bedtime.      Marland Kitchen SYNTHROID 100 MCG tablet Take 1 tablet (100 mcg total) by mouth daily.  30 tablet  3  . traMADol (ULTRAM) 50 MG tablet Take 50 mg by mouth every 6 (six) hours as needed for pain. Maximum dose= 8 tablets per day.      . triazolam (HALCION) 0.25 MG tablet Take 0.25 mg by mouth at bedtime.        No current facility-administered medications for this visit.    OBJECTIVE: Middle-aged white woman who appears well Filed Vitals:   03/28/13 1059  BP: 130/88  Pulse: 79  Temp: 97.6 F (36.4 C)  Resp: 20     Body mass index is 38.03 kg/(m^2).    ECOG FS: 0  Filed Weights   03/28/13 1059  Weight: 235 lb 8 oz (106.822 kg)   Physical Exam: Sclerae unicteric Oropharynx clear No cervical or supraclavicular adenopathy; well-healed incision, status post thyroidectomy Lungs no rales or rhonchi, clear to auscultation bilaterally, no wheezes. Heart regular rate and rhythm Abdomen soft, nontender to  palpation, positive bowel sounds. MSK no focal spinal tenderness,  Nonpitting pedal edema, bilaterally, slightly greater on the left than the right. Neuro: nonfocal, well oriented with positive affect Breasts: The right breast is unremarkable; the left breast is status post lumpectomy and radiation; no evidence of local recurrence. There is a small, dry, erythematous skin lesion in the inferior portion of the left breast, slightly smaller than 1 cm. Axillae are benign bilaterally,  no palpable adenopathy. the left axilla is clear.     LAB RESULTS: Lab Results  Component Value Date   WBC 6.4 03/15/2013   NEUTROABS 3.5 03/15/2013   HGB 13.7 03/15/2013   HCT 39.8 03/15/2013   MCV 93.0 03/15/2013   PLT 270 03/15/2013      Chemistry      Component Value Date/Time   NA 141 03/15/2013 1416   NA 141 12/31/2012 0516   K 3.8 03/15/2013 1416   K 4.1 12/31/2012 0516   CL 106 03/15/2013 1416   CL 108 12/31/2012 0516   CO2 25 03/15/2013 1416   CO2 27 12/31/2012 0516   BUN 10.8 03/15/2013 1416   BUN 9 12/31/2012 0516   CREATININE 1.1 03/15/2013 1416   CREATININE 0.75 12/31/2012 0516      Component Value Date/Time   CALCIUM 9.3 03/15/2013 1416   CALCIUM 9.2 12/31/2012 0516   ALKPHOS 50 03/15/2013 1416   ALKPHOS 49 03/30/2012 0820   AST 18 03/15/2013 1416   AST 17 03/30/2012 0820   ALT 17 03/15/2013 1416   ALT 18 03/30/2012 0820   BILITOT 0.44 03/15/2013 1416   BILITOT 0.5 03/30/2012 0820       STUDIES:  Most recent mammogram on 12/26/2012 was unremarkable.   ASSESSMENT: 56 year old [place] woman   (1)  status post left lumpectomy and sentinel lymph node biopsy March of 2011 for a T1b N0 (Stage IA) invasive ductal carcinoma, grade  2, strongly estrogen and progesterone receptor positive, HER-2 negative, with an MIB-1-1 of 7%.   (2)  She completed radiation in May of 2011   (3)  tried anastrozole and letrozole, with poor tolerance; discontinued tamoxifen November 2012 because of concerns regarding blood  clots. Started exemestane 06/02/2012  (4) enlarging thyroid nodule s/p biopsy 09/07/2012 showing a follicular lesion of undetermined significance.  S/P total thyroidectomy on 12/30/2012, now followed by Dr. Gerrit Friends.  PLAN: [name] is doing very well with regards to her breast cancer, with no clinical evidence of disease recurrence. I am going to refer her back to her dermatologist for evaluation of the small skin lesion in the inferior portion of the left breast, although this does not look like a local recurrence of disease. She'll continue on the exemestane which we have refilled. We discussed using coconut oil, or over-the-counter lubricants for the vaginal dryness.  She return to see [place] in another 6 months, but knows to call prior that time with any changes or problems.  She'll continue to followup with Dr. Wylene Simmer regarding her bone density, and will continue followup with Dr.  Gerrit Friends with regards to her thyroid.     Nhan Qualley    03/28/2013

## 2013-04-03 ENCOUNTER — Ambulatory Visit (INDEPENDENT_AMBULATORY_CARE_PROVIDER_SITE_OTHER): Payer: 59 | Admitting: Surgery

## 2013-04-03 ENCOUNTER — Other Ambulatory Visit (INDEPENDENT_AMBULATORY_CARE_PROVIDER_SITE_OTHER): Payer: Self-pay

## 2013-04-03 ENCOUNTER — Encounter (INDEPENDENT_AMBULATORY_CARE_PROVIDER_SITE_OTHER): Payer: Self-pay | Admitting: Surgery

## 2013-04-03 VITALS — BP 122/78 | HR 60 | Temp 98.0°F | Resp 18 | Ht 66.0 in | Wt 236.0 lb

## 2013-04-03 DIAGNOSIS — E890 Postprocedural hypothyroidism: Secondary | ICD-10-CM

## 2013-04-03 HISTORY — DX: Postprocedural hypothyroidism: E89.0

## 2013-04-03 MED ORDER — LEVOTHYROXINE SODIUM 125 MCG PO TABS: 125.0000 ug | ORAL_TABLET | Freq: Every day | ORAL | Status: DC

## 2013-04-03 NOTE — Progress Notes (Signed)
General Surgery [facility]  Visit Diagnoses: 1. Hypothyroidism, postsurgical     HISTORY: Patient is a 57 year old white female who underwent total thyroidectomy for multiple adenomatous nodules in February 2014. Since surgery she has been taking Synthroid 100 mcg daily. TSH level from 03/30/2013 is markedly elevated at 66.550.  PERTINENT REVIEW OF SYSTEMS: Patient reports fatigue. She has been unable to lose weight. She falls asleep easily. However she has not had any further episodes of SVT.  EXAM: HEENT: normocephalic; pupils equal and reactive; sclerae clear; dentition good; mucous membranes moist NECK:  Surgical wound is well healed with good cosmetic result; no palpable nodules in the thyroid bed; symmetric on extension; no palpable anterior or posterior cervical lymphadenopathy; no supraclavicular masses; no tenderness CHEST: clear to auscultation bilaterally without rales, rhonchi, or wheezes CARDIAC: regular rate and rhythm without significant murmur; peripheral pulses are full EXT:  non-tender without edema; no deformity NEURO: no gross focal deficits; no sign of tremor   IMPRESSION: Status post total thyroidectomy for benign adenomatous nodules Postsurgical hypothyroidism  PLAN: Patient and I discussed her laboratory results. We discussed how she is taking her thyroid medications. I believe she is taking it appropriately. I am therefore going to increase her Synthroid dosage to 125 mcg daily. We will order a follow-up TSH level in 6 weeks.  I will ask her primary care physician, Dr. Guerry Bruin, to take over management of her thyroid hormone replacement.  Patient will return to see me for surgical care as needed.  Velora Heckler, MD, [facility] Office: [phone]

## 2013-04-03 NOTE — Addendum Note (Signed)
Addended by: Joanette Gula on: 04/03/2013 02:54 PM   Modules accepted: Orders

## 2013-04-03 NOTE — Patient Instructions (Signed)

## 2013-04-18 ENCOUNTER — Encounter (INDEPENDENT_AMBULATORY_CARE_PROVIDER_SITE_OTHER): Payer: Self-pay

## 2013-04-27 ENCOUNTER — Telehealth (INDEPENDENT_AMBULATORY_CARE_PROVIDER_SITE_OTHER): Payer: Self-pay

## 2013-04-27 ENCOUNTER — Other Ambulatory Visit (INDEPENDENT_AMBULATORY_CARE_PROVIDER_SITE_OTHER): Payer: Self-pay

## 2013-04-27 MED ORDER — LEVOTHYROXINE SODIUM 125 MCG PO TABS: 125.0000 ug | ORAL_TABLET | Freq: Every day | ORAL | Status: DC

## 2013-04-27 NOTE — Telephone Encounter (Signed)
Rx sent thru [facility] to [facility]. Pt aware.

## 2013-04-27 NOTE — Telephone Encounter (Signed)
Pt called requesting refill on synthroid 100 mcg #30. Pt states she still has 20 of the 25 mcg synthroid that she takes with the 100 mcg to make her dosage 125 mcg. Pt states she is having labs redrawn 05-15-13. Pt advised I will send request to Dr Gerrit Friends. Pt request rx [facility].

## 2013-04-27 NOTE — Telephone Encounter (Signed)
Ok to refill once for 30 days.  She should see Dr. Wylene Simmer, her primary MD, for further Rx's and management of her thyroid hormone replacement.  tmg  Velora Heckler, MD, [facility] Office: [phone]

## 2013-05-15 ENCOUNTER — Other Ambulatory Visit (INDEPENDENT_AMBULATORY_CARE_PROVIDER_SITE_OTHER): Payer: Self-pay

## 2013-05-15 ENCOUNTER — Telehealth (INDEPENDENT_AMBULATORY_CARE_PROVIDER_SITE_OTHER): Payer: Self-pay

## 2013-05-15 DIAGNOSIS — E890 Postprocedural hypothyroidism: Secondary | ICD-10-CM

## 2013-05-15 NOTE — Telephone Encounter (Signed)
Patient calling into office to report that she went to [facility] at lunch to have her TSH drawn.  Patient reports that the lab told her they did not have any order for her labs to be drawn.  Orders printed out and faxed to [facility] @ [phone], fax confirmation rec'd and attached to outgoing fax.  Copy placed in Dr. Sid Falcon box in triage for reference if needed.  Patient states she will go later this week.

## 2013-05-19 LAB — TSH: TSH: 58.22 u[IU]/mL — ABNORMAL HIGH (ref 0.450–4.500)

## 2013-05-22 ENCOUNTER — Telehealth (INDEPENDENT_AMBULATORY_CARE_PROVIDER_SITE_OTHER): Payer: Self-pay

## 2013-05-22 NOTE — Telephone Encounter (Signed)
Rec'd call from [facility] @ Dr. Deneen Harts office for a copy of patient recent TSH levels.  Faxed lab results from 05/18/13 over to [phone]

## 2013-05-23 ENCOUNTER — Telehealth (INDEPENDENT_AMBULATORY_CARE_PROVIDER_SITE_OTHER): Payer: Self-pay

## 2013-05-23 NOTE — Telephone Encounter (Signed)
Lab result faxed to Dr Wylene Simmer per Dr Ardine Eng request with note to follow TSH and Synthroid.

## 2013-05-23 NOTE — Telephone Encounter (Signed)
Message copied by Joanette Gula on Tue May 23, 2013  8:55 AM ------      Message from: Velora Heckler      Created: Tue May 23, 2013  7:35 AM       TSH level has actually INCREASED despite increasing the dosage of Synthroid.            Will ask primary MD to manage from here.            Velora Heckler, MD, [facility]      Office: [phone]             ------

## 2013-07-17 ENCOUNTER — Other Ambulatory Visit: Payer: Self-pay | Admitting: Family Medicine

## 2013-07-17 DIAGNOSIS — M25562 Pain in left knee: Secondary | ICD-10-CM

## 2013-07-17 DIAGNOSIS — M25561 Pain in right knee: Secondary | ICD-10-CM

## 2013-07-23 ENCOUNTER — Ambulatory Visit
Admission: RE | Admit: 2013-07-23 | Discharge: 2013-07-23 | Disposition: A | Discharge status: Home / Self Care | Payer: 59 | Source: Ambulatory Visit | Attending: Family Medicine | Admitting: Family Medicine

## 2013-07-23 DIAGNOSIS — M25562 Pain in left knee: Secondary | ICD-10-CM

## 2013-07-23 DIAGNOSIS — M25561 Pain in right knee: Secondary | ICD-10-CM

## 2013-08-18 ENCOUNTER — Telehealth: Payer: Self-pay | Admitting: *Deleted

## 2013-08-18 DIAGNOSIS — C50912 Malignant neoplasm of unspecified site of left female breast: Secondary | ICD-10-CM

## 2013-08-18 DIAGNOSIS — N952 Postmenopausal atrophic vaginitis: Secondary | ICD-10-CM | POA: Insufficient documentation

## 2013-08-18 DIAGNOSIS — R102 Pelvic and perineal pain: Secondary | ICD-10-CM

## 2013-08-18 DIAGNOSIS — N760 Acute vaginitis: Secondary | ICD-10-CM

## 2013-08-18 HISTORY — DX: Postmenopausal atrophic vaginitis: N95.2

## 2013-08-18 HISTORY — DX: Acute vaginitis: N76.0

## 2013-08-18 NOTE — Telephone Encounter (Signed)
This RN spoke with pt per her call relating to concerns -  [name] states she had thryoidectomy in Feb 2014- which since then has not been able to get TSH in theraputic range. She is under the care of Dr Wylene Simmer. She is compliant with medication administration -  " Dr Wylene Simmer feels that there is an interaction between synthyroid and exemustane "  [name] has researched the above and is not in agreement but she has held her exemustane for 3 weeks so she can have get TSH recheck per Dr [name] concerns.  She has also had chronic knee issues and obtained a cortisone shot.  Note arthritic pain in knees is much better since she is off the AI.  Per discussion- [name] desire to resume the medication " because even though there may be some discomfort, I want the benefit of the medication ".  Pt also inquired per a discussion with a friend who also is seen this office of " a person who specializes in rehab for women "- this RN discussed with [name] - the " vaginal health" PT, [name] and services she can assist with.  [name] is very interested in obtaining a referral.  Note she has had vaginal itching and painful intercourse not benefited from vaginal moisturizers.  Referral placed per the above.  [name] will call with results of TSH and if Dr Wylene Simmer feels she should not take the medication.

## 2013-08-28 ENCOUNTER — Telehealth: Payer: Self-pay | Admitting: *Deleted

## 2013-08-28 NOTE — Telephone Encounter (Signed)
Patient calling to let us know that TSH continues to drop, it has been 4 weeks, and she will continue to stay off Exemustene until next visit with [place] in 4 weeks.

## 2013-09-12 ENCOUNTER — Other Ambulatory Visit (HOSPITAL_BASED_OUTPATIENT_CLINIC_OR_DEPARTMENT_OTHER): Payer: 59

## 2013-09-12 DIAGNOSIS — N649 Disorder of breast, unspecified: Secondary | ICD-10-CM

## 2013-09-12 DIAGNOSIS — C50919 Malignant neoplasm of unspecified site of unspecified female breast: Secondary | ICD-10-CM

## 2013-09-12 DIAGNOSIS — C50912 Malignant neoplasm of unspecified site of left female breast: Secondary | ICD-10-CM

## 2013-09-12 LAB — CBC WITH DIFFERENTIAL/PLATELET
Basophils Absolute: 0.1 10*3/uL (ref 0.0–0.1)
EOS%: 2 % (ref 0.0–7.0)
HCT: 40.7 % (ref 34.8–46.6)
HGB: 13.5 g/dL (ref 11.6–15.9)
MCH: 31.5 pg (ref 25.1–34.0)
MONO#: 0.6 10*3/uL (ref 0.1–0.9)
NEUT#: 4 10*3/uL (ref 1.5–6.5)
NEUT%: 63.7 % (ref 38.4–76.8)
RDW: 13.2 % (ref 11.2–14.5)
WBC: 6.4 10*3/uL (ref 3.9–10.3)
lymph#: 1.5 10*3/uL (ref 0.9–3.3)

## 2013-09-12 LAB — COMPREHENSIVE METABOLIC PANEL (CC13)
ALT: 21 U/L (ref 0–55)
AST: 21 U/L (ref 5–34)
Albumin: 3.9 g/dL (ref 3.5–5.0)
Anion Gap: 8 mEq/L (ref 3–11)
BUN: 10.9 mg/dL (ref 7.0–26.0)
CO2: 26 mEq/L (ref 22–29)
Calcium: 9.4 mg/dL (ref 8.4–10.4)
Chloride: 105 mEq/L (ref 98–109)
Potassium: 3.9 mEq/L (ref 3.5–5.1)

## 2013-09-19 ENCOUNTER — Ambulatory Visit (HOSPITAL_BASED_OUTPATIENT_CLINIC_OR_DEPARTMENT_OTHER): Payer: 59 | Admitting: Oncology

## 2013-09-19 ENCOUNTER — Telehealth: Payer: Self-pay | Admitting: *Deleted

## 2013-09-19 VITALS — BP 146/82 | HR 66 | Temp 98.5°F | Resp 18 | Ht 66.0 in | Wt 231.8 lb

## 2013-09-19 DIAGNOSIS — C50412 Malignant neoplasm of upper-outer quadrant of left female breast: Secondary | ICD-10-CM

## 2013-09-19 DIAGNOSIS — C50219 Malignant neoplasm of upper-inner quadrant of unspecified female breast: Secondary | ICD-10-CM

## 2013-09-19 DIAGNOSIS — C50919 Malignant neoplasm of unspecified site of unspecified female breast: Secondary | ICD-10-CM

## 2013-09-19 DIAGNOSIS — E041 Nontoxic single thyroid nodule: Secondary | ICD-10-CM

## 2013-09-19 HISTORY — DX: Malignant neoplasm of upper-outer quadrant of left female breast: C50.412

## 2013-09-19 NOTE — Progress Notes (Signed)
ID: CAMBELL RICKENBACH   DOB: 1956/10/24  MR#: [id number]  NWG#:[id number]  PCP: Gaspar Garbe, MD GYN: Miguel Aschoff SU: Claud Kelp MD OTHER MD: Will Kellie Simmering, Consuela Mimes, Mercie Eon  HISTORY OF PRESENT ILLNESS: [name] has a history of fibrocystic change.  She took hormone replacement for many years and also vitamin E, but weaned herself off these medications around November 2010.  After a little while she started noticing discomfort in the left breast.  She went back on vitamin E, but this does not take care of the problem and she was evaluated by Dr. Tenny Craw who felt that repeat mammogram should be obtained, even though it was not quite a year from the last one.  [name] found the breast to be predominantly fatty, but in the area of the palpable abnormality there was a circumscribed lobular mass with slightly irregular margins.  The ultrasound showed a lobular septated cyst measuring 2.7 cm and other cysts were also noted corresponding to the palpable mass.  There was in addition a 4 mm area of hypoechoic shadowing that corresponded to the nodular component seen on mammography.  Biopsy of this new mass was recommended and performed by Kerrin Mo at the breast center on December 19, 2009.  This showed ([id number]) an invasive ductal carcinoma in the setting of fibrocystic change.  This appeared to be low-grade and the tumor was strongly positive for ER and PR at 95 and 99% respectively.  It had the tumor cells at a low proliferation marker at 7%, HER2 was not amplified with CISH ratio of 1.43.  Bilateral breast MRIs were obtained on December 26, 2009.  This showed a solitary 1.1 cm area of the regular enhancement in the upper inner left breast.  There were of course cluster of cysts also noted.  This was a secondary that had been biopsied on December 19, 2009 and had shown only fibrocystic change.  With this information after appropriate discussion Dr. Derrell Lolling proceeded to  left lumpectomy and sentinel lymph node sampling January 02, 2010.  The pathology from this procedure ([id number]) showed a 9 mm invasive ductal carcinoma, grade 2 with clear margins and no evidence of angiolymphatic invasion.  Two sentinel lymph nodes were clear. Her subsequent history is as detailed below  INTERVAL HISTORY: [name] returns today  for followup of her breast cancer. Of course what is really bothering her these days is her Synthroid. Her TSH got above 60, she was very tired, slept all the time, and it is now below 10, so she is doing much better. She tells me that Dr. to survey feels there may have been interaction between the exemestane and Synthroid, and she stopped the exemestane late September.   REVIEW OF SYSTEMS: [name] has not noted any difference in her hot flashes after going off the exemestane. She describes herself is anxious and depressed, although that was not apparent at today's visit. She tried to increase her exercise program and she developed rales or fasciitis and knee pains. When she does best with is water aerobics and she likes to do that 3 times a week but getting into the cold water is difficult for her. She has a little bit of discomfort in the left arm and shooting pains in the left breast, both of which are chronic. She keeps a runny nose this time of year. She has a history of palpitations which are not new or more frequent or intense than prior. She is working with Dr. Scharlene Gloss office  as far as her psoriasis is concerned and Dr. August Saucer as far as her orthopedic problems aside from these issues a detailed review of systems today was stable   PAST MEDICAL HISTORY: Past Medical History  Diagnosis Date  . GERD (gastroesophageal reflux disease)   . Anxiety   . SVT (supraventricular tachycardia)   . Osteoporosis   . Osteoporosis   . PONV (postoperative nausea and vomiting)   . Laryngitis 12-20-12    recent episode is resolving with Amoxicillin orally  . Cancer      left breast-(12-12-09 radiation and surgery)-Dr. Darnelle Catalan -oncology  . Arthritis     lower back and thumbs  . Thyroid adenoma 12-20-12    surgery planned 12-30-12    PAST SURGICAL HISTORY: Past Surgical History  Procedure Laterality Date  . Breast surgery      left breast lumpectomy  . Eye surgery  12-20-12    right eye retina tear repair  . Thyroidectomy N/A 12/30/2012    Procedure: THYROIDECTOMY;  Surgeon: Velora Heckler, MD;  Location: [facility];  Service: General;  Laterality: N/A;    FAMILY HISTORY Family History  Problem Relation Age of Onset  . Stroke Mother   . Heart disease Father   . Cancer Brother     melanoma   The patient's father died from cardiac arrest at the age of 73.  The patient's mother died at the age of 38 from a stroke.  The patient has one brother who died from melanoma at the age of 14.  GYNECOLOGIC HISTORY:  She is GX, P0.  Last menstrual period 2004.  She took hormone replacement until November 2010.    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  They have a dog at home.  They are not church attenders.  ADVANCED DIRECTIVES: In place  HEALTH MAINTENANCE: History  Substance Use Topics  . Smoking status: Former Smoker    Types: Cigarettes    Quit date: 01/31/2010  . Smokeless tobacco: Never Used  . Alcohol Use: Yes     Comment: social- occ     Colonoscopy:  Due in 2016  PAP:  Bone density:  Lipid panel:  Allergies  Allergen Reactions  . Sulfa Drugs Cross Reactors Hives    Current Outpatient Prescriptions  Medication Sig Dispense Refill  . acetaminophen (TYLENOL) 500 MG tablet Take 500 mg by mouth every 6 (six) hours as needed for pain.      Marland Kitchen ALPRAZolam (XANAX) 0.5 MG tablet Take 0.5 mg by mouth 3 (three) times daily as needed for anxiety.       Marland Kitchen aspirin 81 MG tablet Take 81 mg by mouth daily.      . calcium carbonate 1250 MG capsule Take 1 capsule  (1,250 mg total) by mouth 2 (two) times daily with a meal.  60 capsule  2  . Calcium Carbonate-Vitamin D (CALCIUM 600 + D PO) Take 1 tablet by mouth 2 (two) times daily.      . cetirizine (ZYRTEC) 10 MG tablet Take 10 mg by mouth daily.      Marland Kitchen CLOBEX SPRAY 0.05 % external spray Apply 1 application topically 2 (two) times daily as needed (Applies to scalp, forehead and elbows for psoriasis.).       Marland Kitchen diltiazem (CARDIZEM CD) 120 MG 24 hr capsule Take 120 mg by mouth every morning.       Marland Kitchen esomeprazole (  NEXIUM) 40 MG capsule Take 40 mg by mouth every morning.       Marland Kitchen exemestane (AROMASIN) 25 MG tablet Take 25 mg by mouth every morning.      . Flaxseed, Linseed, (FLAXSEED OIL PO) Take 1 tablet by mouth every morning.      Marland Kitchen levothyroxine (SYNTHROID) 125 MCG tablet Take 1 tablet (125 mcg total) by mouth daily before breakfast.  30 tablet  6  . Multiple Vitamins-Minerals (MULTIVITAMINS THER. W/MINERALS) TABS Take 1 tablet by mouth every morning.       . sertraline (ZOLOFT) 50 MG tablet Take 50 mg by mouth every morning.       . sodium chloride (OCEAN) 0.65 % SOLN nasal spray Place 1 spray into the nose at bedtime.      . traMADol (ULTRAM) 50 MG tablet Take 50 mg by mouth every 6 (six) hours as needed for pain. Maximum dose= 8 tablets per day.      . triazolam (HALCION) 0.25 MG tablet Take 0.25 mg by mouth at bedtime.        No current facility-administered medications for this visit.    OBJECTIVE: Middle-aged white woman in no acute distress Filed Vitals:   09/19/13 0927  BP: 146/82  Pulse: 66  Temp: 98.5 F (36.9 C)  Resp: 18     Body mass index is 37.43 kg/(m^2).    ECOG FS: 1  Filed Weights   09/19/13 0927  Weight: 231 lb 12.8 oz (105.144 kg)   Physical Exam: Sclerae unicteric Oropharynx clear No cervical or supraclavicular adenopathy; status post thyroidectomy Lungs no rales or rhonchi,. Heart regular rate and rhythm Abdomen soft, nontender, positive bowel sounds. MSK no focal  spinal tenderness,  No upper extremity lymphedema Neuro: nonfocal, well oriented, engaging affect with slight pressure of speech Breasts: The right breast is unremarkable; the left breast is status post lumpectomy and radiation; no evidence of local recurrence. The left axilla is benign  LAB RESULTS: Lab Results  Component Value Date   WBC 6.4 09/12/2013   NEUTROABS 4.0 09/12/2013   HGB 13.5 09/12/2013   HCT 40.7 09/12/2013   MCV 95.0 09/12/2013   PLT 297 09/12/2013      Chemistry      Component Value Date/Time   NA 139 09/12/2013 1315   NA 141 12/31/2012 0516   K 3.9 09/12/2013 1315   K 4.1 12/31/2012 0516   CL 106 03/15/2013 1416   CL 108 12/31/2012 0516   CO2 26 09/12/2013 1315   CO2 27 12/31/2012 0516   BUN 10.9 09/12/2013 1315   BUN 9 12/31/2012 0516   CREATININE 0.8 09/12/2013 1315   CREATININE 0.75 12/31/2012 0516      Component Value Date/Time   CALCIUM 9.4 09/12/2013 1315   CALCIUM 9.2 12/31/2012 0516   ALKPHOS 53 09/12/2013 1315   ALKPHOS 49 03/30/2012 0820   AST 21 09/12/2013 1315   AST 17 03/30/2012 0820   ALT 21 09/12/2013 1315   ALT 18 03/30/2012 0820   BILITOT 0.46 09/12/2013 1315   BILITOT 0.5 03/30/2012 0820       STUDIES:  Most recent mammogram on 12/26/2012 was unremarkable.   ASSESSMENT: 57 y.o.  [place] woman   (1)  status post left lumpectomy and sentinel lymph node biopsy March of 2011 for a T1b N0 (Stage IA) invasive ductal carcinoma, grade 2, strongly estrogen and progesterone receptor positive, HER-2 negative, with an MIB-1-1 of 7%.   (2)  She completed radiation in  May of 2011   (3)  tried anastrozole and letrozole, with poor tolerance; discontinued tamoxifen November 2012 because of concerns regarding blood clots. Started exemestane 06/02/2012, interrupted September 2014 because of concern of possible interaction with Synthroid  (4) enlarging thyroid nodule s/p biopsy 09/07/2012 showing a follicular lesion of undetermined significance.  S/P  total thyroidectomy on 12/30/2012  PLAN: [name] is doing fine from a breast cancer point of view, and I think it is worth he of note that her hot flashes have not gotten any better now off exemestane for 6 weeks. I do not believe there is any interaction between Synthroid and exemestane. As soon as she gets her TSH below 5 chest other resume in the exemestane and then she does need to have the Synthroid rechecked 6 weeks later. I expected we'll continue to be fine. The overall plan of course is for 5 years of exemestane which is going to take her to 2 years from now.  I have encouraged her to continue to exercise as tolerated. She does have to watch out not to injure herself. She knows to call for any problems that may develop before next visit here.    MAGRINAT,GUSTAV C    09/19/2013

## 2013-09-19 NOTE — Telephone Encounter (Signed)
appts made and  Printed...td

## 2013-10-16 ENCOUNTER — Telehealth: Payer: Self-pay | Admitting: *Deleted

## 2013-10-16 NOTE — Telephone Encounter (Signed)
Pt called for a f/u w/ amy. gv appt for 10/18/14 @ 2:15pm. Pt is aware...td

## 2013-10-18 ENCOUNTER — Encounter: Payer: Self-pay | Admitting: Physician Assistant

## 2013-10-18 ENCOUNTER — Ambulatory Visit (HOSPITAL_COMMUNITY): Payer: 59

## 2013-10-18 ENCOUNTER — Ambulatory Visit (HOSPITAL_BASED_OUTPATIENT_CLINIC_OR_DEPARTMENT_OTHER): Payer: 59 | Admitting: Physician Assistant

## 2013-10-18 VITALS — BP 119/77 | HR 75 | Temp 98.1°F | Resp 18 | Ht 66.0 in | Wt 235.7 lb

## 2013-10-18 DIAGNOSIS — M79609 Pain in unspecified limb: Secondary | ICD-10-CM

## 2013-10-18 DIAGNOSIS — R609 Edema, unspecified: Secondary | ICD-10-CM

## 2013-10-18 DIAGNOSIS — R071 Chest pain on breathing: Secondary | ICD-10-CM

## 2013-10-18 DIAGNOSIS — M79601 Pain in right arm: Secondary | ICD-10-CM | POA: Insufficient documentation

## 2013-10-18 DIAGNOSIS — R0789 Other chest pain: Secondary | ICD-10-CM

## 2013-10-18 DIAGNOSIS — R600 Localized edema: Secondary | ICD-10-CM | POA: Insufficient documentation

## 2013-10-18 DIAGNOSIS — C50419 Malignant neoplasm of upper-outer quadrant of unspecified female breast: Secondary | ICD-10-CM

## 2013-10-18 DIAGNOSIS — C50412 Malignant neoplasm of upper-outer quadrant of left female breast: Secondary | ICD-10-CM

## 2013-10-18 NOTE — Progress Notes (Signed)
ID: [name]   DOB: [birth year]  MR#: [id number]  WUJ#:[id number]  PCP: Gaspar Garbe, MD GYN: Miguel Aschoff SU: Claud Kelp MD OTHER MD: Will Kellie Simmering, Consuela Mimes, Mercie Eon  CHIEF COMPLAINTS:  1) Hx left Breast Cancer                 2)  Increased Pain and Swelling in     Left Arm    HISTORY OF PRESENT ILLNESS: [name] has a history of fibrocystic change.  She took hormone replacement for many years and also vitamin E, but weaned herself off these medications around November 2010.  After a little while she started noticing discomfort in the left breast.  She went back on vitamin E, but this does not take care of the problem and she was evaluated by Dr. Tenny Craw who felt that repeat mammogram should be obtained, even though it was not quite a year from the last one.  [name] found the breast to be predominantly fatty, but in the area of the palpable abnormality there was a circumscribed lobular mass with slightly irregular margins.  The ultrasound showed a lobular septated cyst measuring 2.7 cm and other cysts were also noted corresponding to the palpable mass.  There was in addition a 4 mm area of hypoechoic shadowing that corresponded to the nodular component seen on mammography.  Biopsy of this new mass was recommended and performed by Kerrin Mo at the breast center on December 19, 2009.  This showed ([id number]) an invasive ductal carcinoma in the setting of fibrocystic change.  This appeared to be low-grade and the tumor was strongly positive for ER and PR at 95 and 99% respectively.  It had the tumor cells at a low proliferation marker at 7%, HER2 was not amplified with CISH ratio of 1.43.  Bilateral breast MRIs were obtained on December 26, 2009.  This showed a solitary 1.1 cm area of the regular enhancement in the upper inner left breast.  There were of course cluster of cysts also noted.  This was a secondary that had been biopsied on December 19, 2009  and had shown only fibrocystic change.  With this information after appropriate discussion Dr. Derrell Lolling proceeded to left lumpectomy and sentinel lymph node sampling January 02, 2010.  The pathology from this procedure ([id number]) showed a 9 mm invasive ductal carcinoma, grade 2 with clear margins and no evidence of angiolymphatic invasion.  Two sentinel lymph nodes were clear. Her subsequent history is as detailed below  INTERVAL HISTORY: [name] returns alone today in between routine followup visits for evaluation of increased pain and swelling in her left upper extremity. She's had some pain on and off in the left arm for quite a while, but noticed new swelling over the past 7-10 days. This involves primarily the shoulder and upper portion of the left arm, although she also has pain and discomfort in the left forearm. She denies any known injuries. She describes this as a constant dull ache, sometimes throbbing.  In conversation she also tells me she has some "funny feelings" in the left side of her chest and also has some pain radiating to the back around the scapular area. She has a history of SVT.    Otherwise, as a brief recap of [name] current situation, she was taking exemestane, and is also on Synthroid for hypothyroidism. Her TSH went up to 66, and her primary care physician felt like the exemestane was interacting with the Synthroid. Accordingly, she stopped  the exemestane in late September. The plan is to restart the exemestane once her TSH gets down between 1.0 and 2.0. Her current dose of Synthroid is 250 mcg daily.   REVIEW OF SYSTEMS: [name] has had no fevers, chills, night sweats, or hot flashes. Her energy level is low. She's having quite a bit of anxiety, which she thinks is provoked by the pain in her left arm. She denies any depression, and has no suicidal ideation. Her appetite is good and she's had no nausea or change in bowel or bladder habits. She denies any cough, shortness of  breath, or orthopnea. She also denies any abnormal headaches or dizziness. She has known plantar fasciitis which causes pain in the feet, but otherwise denies any additional myalgias, arthralgias, or bony pain. She has chronic swelling in the lower extremities, especially in the left ankle which she sprained several years ago. This is stable.  A detailed review of systems is otherwise stable and noncontributory.     PAST MEDICAL HISTORY: Past Medical History  Diagnosis Date  . GERD (gastroesophageal reflux disease)   . Anxiety   . SVT (supraventricular tachycardia)   . Osteoporosis   . Osteoporosis   . PONV (postoperative nausea and vomiting)   . Laryngitis 12-20-12    recent episode is resolving with Amoxicillin orally  . Cancer     left breast-(12-12-09 radiation and surgery)-Dr. Darnelle Catalan -oncology  . Arthritis     lower back and thumbs  . Thyroid adenoma 12-20-12    surgery planned 12-30-12    PAST SURGICAL HISTORY: Past Surgical History  Procedure Laterality Date  . Breast surgery      left breast lumpectomy  . Eye surgery  12-20-12    right eye retina tear repair  . Thyroidectomy N/A 12/30/2012    Procedure: THYROIDECTOMY;  Surgeon: Velora Heckler, MD;  Location: [facility];  Service: General;  Laterality: N/A;    FAMILY HISTORY Family History  Problem Relation Age of Onset  . Stroke Mother   . Heart disease Father   . Cancer Brother     melanoma   The patient's father died from cardiac arrest at the age of 26.  The patient's mother died at the age of 46 from a stroke.  The patient has one brother who died from melanoma at the age of 64.  GYNECOLOGIC HISTORY:  She is GX, P0.  Last menstrual period 2004.  She took hormone replacement until November 2010.    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  They have a dog at home.  They are not church attenders.  ADVANCED DIRECTIVES: In  place  HEALTH MAINTENANCE:  (Updated 10/18/2013) History  Substance Use Topics  . Smoking status: Former Smoker    Types: Cigarettes    Quit date: 01/31/2010  . Smokeless tobacco: Never Used  . Alcohol Use: Yes     Comment: social- occ     Colonoscopy:  Due in 2016  PAP:  April 2014, Dr. Tenny Craw  Bone density:  Not on File  Lipid panel: Not on File/[facility]    Allergies  Allergen Reactions  . Sulfa Drugs Cross Reactors Hives    Current Outpatient Prescriptions  Medication Sig Dispense Refill  . acetaminophen (TYLENOL) 500 MG tablet Take 500 mg by mouth every 6 (six) hours as needed for pain.      Marland Kitchen ALPRAZolam (XANAX) 0.5 MG tablet  Take 0.5 mg by mouth 3 (three) times daily as needed for anxiety.       Marland Kitchen aspirin 81 MG tablet Take 81 mg by mouth daily.      . Calcium Carbonate-Vitamin D (CALCIUM 600 + D PO) Take 1 tablet by mouth 2 (two) times daily.      . cetirizine (ZYRTEC) 10 MG tablet Take 10 mg by mouth daily.      Marland Kitchen diltiazem (CARDIZEM CD) 120 MG 24 hr capsule Take 120 mg by mouth every morning.       Marland Kitchen esomeprazole (NEXIUM) 40 MG capsule Take 40 mg by mouth every morning.       . Flaxseed, Linseed, (FLAXSEED OIL PO) Take 1 tablet by mouth every morning.      . halobetasol (ULTRAVATE) 0.05 % cream       . Levothyroxine Sodium (SYNTHROID PO) Take 250 mcg by mouth daily.      . Multiple Vitamins-Minerals (MULTIVITAMINS THER. W/MINERALS) TABS Take 1 tablet by mouth every morning.       . sertraline (ZOLOFT) 100 MG tablet Take 100 mg by mouth daily.      . sodium chloride (OCEAN) 0.65 % SOLN nasal spray Place 1 spray into the nose at bedtime.      . traMADol (ULTRAM) 50 MG tablet Take 50 mg by mouth every 6 (six) hours as needed for pain. Maximum dose= 8 tablets per day.      . triazolam (HALCION) 0.25 MG tablet Take 0.25 mg by mouth at bedtime.       Marland Kitchen exemestane (AROMASIN) 25 MG tablet Take 25 mg by mouth every morning.      . meloxicam (MOBIC) 7.5 MG tablet        No  current facility-administered medications for this visit.    OBJECTIVE: Middle-aged white Cameron who appears uncomfortable but is in no acute distress Filed Vitals:   10/18/13 1408  BP: 119/77  Pulse: 75  Temp: 98.1 F (36.7 C)  Resp: 18     Body mass index is 38.06 kg/(m^2).    ECOG FS: 1 Filed Weights   10/18/13 1408  Weight: 235 lb 11.2 oz (106.913 kg)   Physical Exam: HEENT:  Sclerae anicteric.  Oropharynx clear and moist. Neck supple. Horizontal incision, well healed, status post thyroidectomy.  NODES:  No cervical or supraclavicular lymphadenopathy palpated.  BREAST EXAM:  Right breast is unremarkable. Left breast is status post lumpectomy with well-healed incision, no suspicious nodularity, and no skin changes. No evidence of local recurrence. Axillae are benign bilaterally for palpable lymphadenopathy. LUNGS:  Clear to auscultation bilaterally.  No wheezes or rhonchi HEART:  Regular rate and rhythm. No murmur  ABDOMEN:  Soft, obese, nontender.  Positive bowel sounds.  MSK:  No focal spinal tenderness to palpation. Slightly limited range of motion in the left upper extremity secondary to discomfort and swelling. There is some slight edema noted in the upper left chest wall and left shoulder. The edema is more noticeable in the upper portion of the left arm. There is no erythema or skin changes, and no evidence of cellulitis. EXTREMITIES:  Chronic pedal edema, with compression stockings in place bilaterally SKIN:  Benign. No rashes or skin changes. No erythema or evidence of cellulitis in the left upper extremity. NEURO:  Nonfocal. Well oriented.  Worried affect.   LAB RESULTS: Lab Results  Component Value Date   WBC 6.4 09/12/2013   NEUTROABS 4.0 09/12/2013   HGB 13.5 09/12/2013  HCT 40.7 09/12/2013   MCV 95.0 09/12/2013   PLT 297 09/12/2013      Chemistry      Component Value Date/Time   NA 139 09/12/2013 1315   NA 141 12/31/2012 0516   K 3.9 09/12/2013 1315   K  4.1 12/31/2012 0516   CL 106 03/15/2013 1416   CL 108 12/31/2012 0516   CO2 26 09/12/2013 1315   CO2 27 12/31/2012 0516   BUN 10.9 09/12/2013 1315   BUN 9 12/31/2012 0516   CREATININE 0.8 09/12/2013 1315   CREATININE 0.75 12/31/2012 0516      Component Value Date/Time   CALCIUM 9.4 09/12/2013 1315   CALCIUM 9.2 12/31/2012 0516   ALKPHOS 53 09/12/2013 1315   ALKPHOS 49 03/30/2012 0820   AST 21 09/12/2013 1315   AST 17 03/30/2012 0820   ALT 21 09/12/2013 1315   ALT 18 03/30/2012 0820   BILITOT 0.46 09/12/2013 1315   BILITOT 0.5 03/30/2012 0820       STUDIES:  Most recent mammogram on 12/26/2012 was unremarkable.   EKG was obtained in the office today, 10/18/2013, and was normal with a normal sinus rhythm.  A Doppler study of the left upper extremity has been scheduled for tomorrow morning, 10/19/2013, to assess for DVT.   ASSESSMENT: 57 y.o.  [name]   (1)  status post left lumpectomy and sentinel lymph node biopsy March of 2011 for a T1b N0 (Stage IA) invasive ductal carcinoma, grade 2, strongly estrogen and progesterone receptor positive, HER-2 negative, with an MIB-1-1 of 7%.   (2)  She completed radiation in May of 2011   (3)  tried anastrozole and letrozole, with poor tolerance; discontinued tamoxifen November 2012 because of concerns regarding blood clots. Started exemestane 06/02/2012, interrupted September 2014 because of concern of possible interaction with Synthroid  (4) enlarging thyroid nodule s/p biopsy 09/07/2012 showing a follicular lesion of undetermined significance.  S/P total thyroidectomy on 12/30/2012  (5)  increased pain and edema in the left upper extremity, being assessed for DVT versus lymphedema   PLAN: Over half of our 40 minute appointment today was spent reviewing [name] concerns, counseling her with regards to her anxiety, discussing options for evaluation, and coordinating care.  [name] is being scheduled for a Doppler of the left upper  extremity. She tells me she cannot do this afternoon due to her schedule, but can be their early tomorrow morning. If the Doppler is negative for DVT, we will likely refer her to the lymphedema clinic for further evaluation.   Fortunately, the EKG noted above was unremarkable, and the pain in the left chest wall appears to be musculoskeletal.  Also as noted above, [name] plans to restart the exemestane once her TSH is between 1.0-2.0. She continues to be followed closely by her PCP, Dr. Wylene Simmer, with regards to her hyperthyroidism and her Synthroid dose.  The overall plan of course is for 5 years of exemestane.  All of this was reviewed in detail with [facility] today and she does voice her understanding in complete agreement with this plan. She will call with any changes, and we will be glad to see her sooner than her May 2015 appointment if necessary.   Sutton Hirsch PA-C   10/18/2013

## 2013-10-19 ENCOUNTER — Ambulatory Visit (INDEPENDENT_AMBULATORY_CARE_PROVIDER_SITE_OTHER): Payer: 59

## 2013-10-19 ENCOUNTER — Encounter: Payer: Self-pay | Admitting: Podiatry

## 2013-10-19 ENCOUNTER — Telehealth: Payer: Self-pay | Admitting: Oncology

## 2013-10-19 ENCOUNTER — Ambulatory Visit (INDEPENDENT_AMBULATORY_CARE_PROVIDER_SITE_OTHER): Payer: 59 | Admitting: Podiatry

## 2013-10-19 ENCOUNTER — Ambulatory Visit (HOSPITAL_COMMUNITY)
Admission: RE | Admit: 2013-10-19 | Discharge: 2013-10-19 | Disposition: A | Discharge status: Home / Self Care | Payer: 59 | Source: Ambulatory Visit | Attending: Physician Assistant | Admitting: Physician Assistant

## 2013-10-19 ENCOUNTER — Telehealth: Payer: Self-pay | Admitting: *Deleted

## 2013-10-19 ENCOUNTER — Other Ambulatory Visit: Payer: Self-pay | Admitting: Physician Assistant

## 2013-10-19 VITALS — BP 133/68 | HR 71 | Resp 16 | Ht 66.0 in | Wt 230.0 lb

## 2013-10-19 DIAGNOSIS — M79601 Pain in right arm: Secondary | ICD-10-CM

## 2013-10-19 DIAGNOSIS — M7989 Other specified soft tissue disorders: Secondary | ICD-10-CM | POA: Insufficient documentation

## 2013-10-19 DIAGNOSIS — R600 Localized edema: Secondary | ICD-10-CM

## 2013-10-19 DIAGNOSIS — C50412 Malignant neoplasm of upper-outer quadrant of left female breast: Secondary | ICD-10-CM

## 2013-10-19 DIAGNOSIS — M722 Plantar fascial fibromatosis: Secondary | ICD-10-CM

## 2013-10-19 DIAGNOSIS — Z853 Personal history of malignant neoplasm of breast: Secondary | ICD-10-CM

## 2013-10-19 DIAGNOSIS — M79609 Pain in unspecified limb: Secondary | ICD-10-CM | POA: Insufficient documentation

## 2013-10-19 MED ORDER — TRIAMCINOLONE ACETONIDE 10 MG/ML IJ SUSP
10.0000 mg | Freq: Once | INTRAMUSCULAR | Status: AC
  Administered 2013-10-19: 10 mg

## 2013-10-19 NOTE — Progress Notes (Signed)
Subjective:     Patient ID: [name], female   DOB: Sep 28, 1956, 57 y.o.   MRN: [id number]  Foot Pain   patient presents stating I have pain in my left heel for at least 4 months. States that it's been getting gradually worse over time and makes it hard to ambulate   Review of Systems  All other systems reviewed and are negative.       Objective:   Physical Exam  Nursing note and vitals reviewed. Constitutional: She is oriented to person, place, and time.  Cardiovascular: Intact distal pulses.   Musculoskeletal: Normal range of motion.  Neurological: She is oriented to person, place, and time.  Skin: Skin is warm.   neurovascular status intact with pain in the left plantar heel at the insertional point of the tendon into the calcaneus. Muscle strength adequate with no equinus condition noted     Assessment:     Acute plantar fasciitis left with inflammation and fluid buildup    Plan:     H&P and x-ray reviewed and today I injected the left plantar fascia 3 mg Kenalog 5 of Xylocaine Marcaine mixture advised him reduced activity and anti-inflammatory treatment. Reappoint in the next 2 weeks

## 2013-10-19 NOTE — Progress Notes (Signed)
VASCULAR LAB PRELIMINARY  PRELIMINARY  PRELIMINARY  PRELIMINARY  Left upper extremity venous duplex completed.    Preliminary report:  Left:  No evidence of DVT or superficial thrombosis.    Tanita Palinkas, RVT 10/19/2013, 9:06 AM

## 2013-10-19 NOTE — Progress Notes (Signed)
[name] Doppler of the left upper extremity was negative for DVT. Accordingly, as we had previously discussed, I am referring her to the lymphedema clinic for further evaluation.  Currently, her next appointment with me is in May 2015, but if the swelling does not improve, and certainly if it worsens, I have asked her to call and make an earlier appointment.  Although I feel like this is likely secondary to lymphedema, the  next step would likely be to evaluate further with a chest CT.  Zollie Scale, PA-C 10/19/2013

## 2013-10-19 NOTE — Progress Notes (Signed)
   Subjective:    Patient ID: [name], female    DOB: [birth year], 57 y.o.   MRN: [id number]  HPI Comments: "I think I've got plantar fasciitis"  Pt c/o plantar heel pain left, aches, approx 4 mos. Has noticed recently some redness and swelling in the area. Some AM pain, but tries to stretch it before getting up. Walking a lot aggravates. Tried stretching exercises, tramadol and tylenol with little relief.     Review of Systems  Constitutional: Positive for chills.  HENT: Positive for tinnitus.   Eyes: Positive for visual disturbance.  Cardiovascular: Positive for leg swelling.  Musculoskeletal: Positive for arthralgias and gait problem.  Skin: Positive for rash.       Psoriasis   Neurological: Positive for dizziness, light-headedness and numbness.  Hematological: Bruises/bleeds easily.  Psychiatric/Behavioral: The patient is nervous/anxious.   All other systems reviewed and are negative.       Objective:   Physical Exam        Assessment & Plan:

## 2013-10-19 NOTE — Patient Instructions (Signed)

## 2013-10-19 NOTE — Telephone Encounter (Signed)
S/W AMERE FROM THE OUTPATIENT PHYSICAL THERAPY DEPT AND HE IS AWARE OF THE REFERRAL IN EPIC AND WILL SCHEDULE THE APPT WITH THE PT

## 2013-10-19 NOTE — Telephone Encounter (Signed)
Per PA-C request. Called pt to inform her of results of Doppler test and a referral has been placed at Lymphedema Clinic. Told patient to expect a call for time of appt.. Also I told her to call us if condition has improved or pain/swelling increases.

## 2013-10-23 ENCOUNTER — Ambulatory Visit (INDEPENDENT_AMBULATORY_CARE_PROVIDER_SITE_OTHER): Payer: 59

## 2013-10-23 ENCOUNTER — Ambulatory Visit (INDEPENDENT_AMBULATORY_CARE_PROVIDER_SITE_OTHER): Payer: 59 | Admitting: Podiatry

## 2013-10-23 ENCOUNTER — Encounter: Payer: Self-pay | Admitting: Podiatry

## 2013-10-23 VITALS — BP 131/68 | HR 61 | Resp 16

## 2013-10-23 DIAGNOSIS — G589 Mononeuropathy, unspecified: Secondary | ICD-10-CM

## 2013-10-23 DIAGNOSIS — M722 Plantar fascial fibromatosis: Secondary | ICD-10-CM

## 2013-10-23 DIAGNOSIS — R52 Pain, unspecified: Secondary | ICD-10-CM

## 2013-10-23 NOTE — Progress Notes (Signed)
Subjective:     Patient ID: [name], female   DOB: 1955/11/18, 57 y.o.   MRN: [id number]  HPI patient presents stating I am getting some weird burning on the outside of my left foot that was worrying may and my heel is feeling pretty good   Review of Systems     Objective:   Physical Exam Neurovascular status intact with no muscle strength loss and DTR reflexes intact with discomfort in the lateral side of the left foot but not to palpation only at sporadic times    Assessment:     May be a problem with her back versus localized inflammatory condition or change from gait been more improved after heel has been worked    Plan:     Therapist, art x-ray taken and advised on Epsom salt soaks and consideration for Medrol Dosepak if symptoms do not get better. If she should develop muscle weakness or progression she is going to need to see her regular doctor for possible neuro consult reappoint for regular visit

## 2013-10-30 ENCOUNTER — Ambulatory Visit: Payer: 59 | Admitting: Physical Therapy

## 2013-10-30 ENCOUNTER — Ambulatory Visit: Payer: 59 | Admitting: Podiatry

## 2013-11-09 ENCOUNTER — Ambulatory Visit: Payer: 59 | Attending: Physician Assistant | Admitting: Physical Therapy

## 2013-11-09 DIAGNOSIS — Z853 Personal history of malignant neoplasm of breast: Secondary | ICD-10-CM | POA: Insufficient documentation

## 2013-11-09 DIAGNOSIS — M810 Age-related osteoporosis without current pathological fracture: Secondary | ICD-10-CM | POA: Insufficient documentation

## 2013-11-09 DIAGNOSIS — M722 Plantar fascial fibromatosis: Secondary | ICD-10-CM | POA: Insufficient documentation

## 2013-11-09 DIAGNOSIS — F411 Generalized anxiety disorder: Secondary | ICD-10-CM | POA: Insufficient documentation

## 2013-11-09 DIAGNOSIS — IMO0001 Reserved for inherently not codable concepts without codable children: Secondary | ICD-10-CM | POA: Insufficient documentation

## 2013-11-09 DIAGNOSIS — I890 Lymphedema, not elsewhere classified: Secondary | ICD-10-CM | POA: Insufficient documentation

## 2013-11-09 DIAGNOSIS — I498 Other specified cardiac arrhythmias: Secondary | ICD-10-CM | POA: Insufficient documentation

## 2013-11-16 ENCOUNTER — Ambulatory Visit: Payer: 59 | Admitting: Physical Therapy

## 2013-11-17 ENCOUNTER — Other Ambulatory Visit: Payer: Self-pay | Admitting: Oncology

## 2013-11-17 DIAGNOSIS — Z1231 Encounter for screening mammogram for malignant neoplasm of breast: Secondary | ICD-10-CM

## 2013-11-17 DIAGNOSIS — Z853 Personal history of malignant neoplasm of breast: Secondary | ICD-10-CM

## 2013-11-17 DIAGNOSIS — Z9889 Other specified postprocedural states: Secondary | ICD-10-CM

## 2013-11-20 ENCOUNTER — Encounter: Payer: 59 | Admitting: Physical Therapy

## 2013-12-22 ENCOUNTER — Encounter: Payer: Self-pay | Admitting: General Surgery

## 2013-12-22 DIAGNOSIS — I471 Supraventricular tachycardia, unspecified: Secondary | ICD-10-CM

## 2013-12-22 HISTORY — DX: Supraventricular tachycardia: I47.1

## 2013-12-22 HISTORY — DX: Supraventricular tachycardia, unspecified: I47.10

## 2013-12-25 ENCOUNTER — Other Ambulatory Visit: Payer: Self-pay | Admitting: Oncology

## 2013-12-25 ENCOUNTER — Encounter: Payer: Self-pay | Admitting: Cardiology

## 2013-12-25 ENCOUNTER — Ambulatory Visit (INDEPENDENT_AMBULATORY_CARE_PROVIDER_SITE_OTHER): Payer: 59 | Admitting: Cardiology

## 2013-12-25 VITALS — BP 114/70 | HR 62 | Ht 66.0 in | Wt 234.0 lb

## 2013-12-25 DIAGNOSIS — C50419 Malignant neoplasm of upper-outer quadrant of unspecified female breast: Secondary | ICD-10-CM

## 2013-12-25 DIAGNOSIS — I471 Supraventricular tachycardia: Secondary | ICD-10-CM

## 2013-12-25 NOTE — Patient Instructions (Signed)
Your physician recommends that you continue on your current medications as directed. Please refer to the Current Medication list given to you today.  Your physician wants you to follow-up in: 1 year with Dr. Turner. You will receive a reminder letter in the mail two months in advance. If you don't receive a letter, please call our office to schedule the follow-up appointment.  

## 2013-12-25 NOTE — Progress Notes (Signed)
[place], [place], [place]  [address] Phone: [phone] Fax:  [phone]  Date:  12/25/2013   ID:  [name], DOB February 15, 1956, MRN [id number]  PCP:  Haywood Pao, MD  Cardiologist:  Fransico Him, MD     History of Present Illness: [name] is a 58 y.o. female with a history of SVT who presents today for followup.  She is doing well.  She denies any chest pain, SOB, DOE, dizziness or syncope.  She occasionally has palpitations which only last a few seconds and are stable on Diltiazem.  She occasionally has some LE edema in her LLE.   Wt Readings from Last 3 Encounters:  12/25/13 234 lb (106.142 kg)  10/19/13 230 lb (104.327 kg)  10/18/13 235 lb 11.2 oz (106.913 kg)     Past Medical History  Diagnosis Date  . GERD (gastroesophageal reflux disease)     [facility]), neg for metaplasia '97, '08 (mod HH) 9.2011  . Anxiety   . SVT (supraventricular tachycardia)   . Osteoporosis   . Osteoporosis   . PONV (postoperative nausea and vomiting)   . Laryngitis 12-20-12    recent episode is resolving with Amoxicillin orally  . Arthritis     lower back and thumbs  . Thyroid adenoma 12-20-12    surgery planned 12-30-12  . Obesity   . Dyslipidemia   . Cancer     left breast-(12-12-09 radiation and surgery)-Dr. Jana Hakim -oncology  . Colon polyps     sm aden p 10/06 (difficult exam, needs propofol);neg 07/2010    Current Outpatient Prescriptions  Medication Sig Dispense Refill  . acetaminophen (TYLENOL) 500 MG tablet Take 500 mg by mouth every 6 (six) hours as needed for pain.      Marland Kitchen ALPRAZolam (XANAX) 0.5 MG tablet Take 0.5 mg by mouth 3 (three) times daily as needed for anxiety.       Marland Kitchen aspirin 81 MG tablet Take 81 mg by mouth every other day.       . Calcium Carbonate-Vitamin D (CALCIUM 600 + D PO) Take 1 tablet by mouth 2 (two) times daily.      . cetirizine (ZYRTEC) 10 MG tablet Take 10 mg by mouth daily.      Marland Kitchen diltiazem (CARDIZEM  CD) 120 MG 24 hr capsule Take 120 mg by mouth every morning.       Marland Kitchen esomeprazole (NEXIUM) 40 MG capsule Take 40 mg by mouth every morning.       Marland Kitchen exemestane (AROMASIN) 25 MG tablet Take 25 mg by mouth every morning.      . Flaxseed, Linseed, (FLAXSEED OIL PO) Take 1 tablet by mouth every morning.      . halobetasol (ULTRAVATE) 0.05 % cream as needed.       . Levothyroxine Sodium (SYNTHROID PO) Take 250 mcg by mouth daily.      . Multiple Vitamins-Minerals (MULTIVITAMINS THER. W/MINERALS) TABS Take 1 tablet by mouth every morning.       . sertraline (ZOLOFT) 100 MG tablet Take 100 mg by mouth daily.      . sodium chloride (OCEAN) 0.65 % SOLN nasal spray Place 1 spray into the nose at bedtime.      . traMADol (ULTRAM) 50 MG tablet Take 50 mg by mouth every 6 (six) hours as needed for pain. Maximum dose= 8 tablets per day.       No current facility-administered medications for this visit.  Allergies:    Allergies  Allergen Reactions  . Sulfa Drugs Cross Reactors Hives    Social History:  The patient  reports that she quit smoking about 3 years ago. Her smoking use included Cigarettes. She smoked 0.00 packs per day. She has never used smokeless tobacco. She reports that she drinks alcohol. She reports that she does not use illicit drugs.   Family History:  The patient's family history includes Cancer in her brother; Heart disease in her father; Stroke in her mother.   ROS:  Please see the history of present illness.      All other systems reviewed and negative.   PHYSICAL EXAM: VS:  BP 114/70  Pulse 62  Ht 5\' 6"  (1.676 m)  Wt 234 lb (106.142 kg)  BMI 37.79 kg/m2 Well nourished, well developed, in no acute distress HEENT: normal Neck: no JVD Cardiac:  normal S1, S2; RRR; no murmur Lungs:  clear to auscultation bilaterally, no wheezing, rhonchi or rales Abd: soft, nontender, no hepatomegaly Ext: no edema Skin: warm and dry Neuro:  CNs 2-12 intact, no focal abnormalities  noted  EKG:     NSR with no ST changes  ASSESSMENT AND PLAN:  1. PSVT which is well controlled happening about once a week for only a few seconds  - continue Diltiazem  Followup with me in 1 year  Signed, Fransico Him, MD 12/25/2013 12:59 PM

## 2014-01-08 ENCOUNTER — Ambulatory Visit
Admission: RE | Admit: 2014-01-08 | Discharge: 2014-01-08 | Disposition: A | Discharge status: Home / Self Care | Payer: Self-pay | Source: Ambulatory Visit | Attending: Oncology | Admitting: Oncology

## 2014-01-08 DIAGNOSIS — Z9889 Other specified postprocedural states: Secondary | ICD-10-CM

## 2014-01-08 DIAGNOSIS — Z1231 Encounter for screening mammogram for malignant neoplasm of breast: Secondary | ICD-10-CM

## 2014-01-08 DIAGNOSIS — Z853 Personal history of malignant neoplasm of breast: Secondary | ICD-10-CM

## 2014-01-26 ENCOUNTER — Other Ambulatory Visit: Payer: Self-pay | Admitting: *Deleted

## 2014-01-26 ENCOUNTER — Other Ambulatory Visit: Payer: Self-pay | Admitting: Oncology

## 2014-01-26 MED ORDER — DILTIAZEM HCL ER COATED BEADS 120 MG PO CP24: 120.0000 mg | ORAL_CAPSULE | Freq: Every morning | ORAL | Status: DC

## 2014-02-16 ENCOUNTER — Other Ambulatory Visit: Payer: Self-pay | Admitting: Obstetrics and Gynecology

## 2014-03-12 ENCOUNTER — Other Ambulatory Visit (HOSPITAL_BASED_OUTPATIENT_CLINIC_OR_DEPARTMENT_OTHER): Payer: BC Managed Care – PPO

## 2014-03-12 DIAGNOSIS — C50919 Malignant neoplasm of unspecified site of unspecified female breast: Secondary | ICD-10-CM

## 2014-03-12 DIAGNOSIS — E041 Nontoxic single thyroid nodule: Secondary | ICD-10-CM

## 2014-03-12 DIAGNOSIS — C50219 Malignant neoplasm of upper-inner quadrant of unspecified female breast: Secondary | ICD-10-CM

## 2014-03-12 LAB — CBC WITH DIFFERENTIAL/PLATELET
BASO%: 0.4 % (ref 0.0–2.0)
Basophils Absolute: 0 10*3/uL (ref 0.0–0.1)
EOS%: 3.1 % (ref 0.0–7.0)
Eosinophils Absolute: 0.1 10*3/uL (ref 0.0–0.5)
HEMATOCRIT: 40.1 % (ref 34.8–46.6)
HEMOGLOBIN: 13.4 g/dL (ref 11.6–15.9)
LYMPH%: 30.3 % (ref 14.0–49.7)
MCH: 31.3 pg (ref 25.1–34.0)
MCHC: 33.5 g/dL (ref 31.5–36.0)
MCV: 93.4 fL (ref 79.5–101.0)
MONO#: 0.5 10*3/uL (ref 0.1–0.9)
MONO%: 11.4 % (ref 0.0–14.0)
NEUT%: 54.8 % (ref 38.4–76.8)
NEUTROS ABS: 2.5 10*3/uL (ref 1.5–6.5)
PLATELETS: 258 10*3/uL (ref 145–400)
RBC: 4.3 10*6/uL (ref 3.70–5.45)
RDW: 13.4 % (ref 11.2–14.5)
WBC: 4.6 10*3/uL (ref 3.9–10.3)
lymph#: 1.4 10*3/uL (ref 0.9–3.3)

## 2014-03-12 LAB — TSH CHCC: TSH: 4.487 m[IU]/L — AB (ref 0.308–3.960)

## 2014-03-12 LAB — COMPREHENSIVE METABOLIC PANEL (CC13)
ALK PHOS: 53 U/L (ref 40–150)
ALT: 15 U/L (ref 0–55)
AST: 17 U/L (ref 5–34)
Albumin: 3.8 g/dL (ref 3.5–5.0)
Anion Gap: 9 mEq/L (ref 3–11)
BUN: 11.4 mg/dL (ref 7.0–26.0)
CALCIUM: 9.7 mg/dL (ref 8.4–10.4)
CO2: 26 mEq/L (ref 22–29)
Chloride: 107 mEq/L (ref 98–109)
Creatinine: 0.9 mg/dL (ref 0.6–1.1)
Glucose: 74 mg/dl (ref 70–140)
Potassium: 4 mEq/L (ref 3.5–5.1)
Sodium: 141 mEq/L (ref 136–145)
Total Bilirubin: 0.76 mg/dL (ref 0.20–1.20)
Total Protein: 6.6 g/dL (ref 6.4–8.3)

## 2014-03-19 ENCOUNTER — Ambulatory Visit (HOSPITAL_BASED_OUTPATIENT_CLINIC_OR_DEPARTMENT_OTHER): Payer: BC Managed Care – PPO | Admitting: Physician Assistant

## 2014-03-19 ENCOUNTER — Encounter: Payer: Self-pay | Admitting: Physician Assistant

## 2014-03-19 VITALS — BP 120/74 | HR 68 | Temp 98.0°F | Resp 18 | Ht 66.0 in | Wt 224.0 lb

## 2014-03-19 DIAGNOSIS — E890 Postprocedural hypothyroidism: Secondary | ICD-10-CM

## 2014-03-19 DIAGNOSIS — M2550 Pain in unspecified joint: Secondary | ICD-10-CM

## 2014-03-19 DIAGNOSIS — C50219 Malignant neoplasm of upper-inner quadrant of unspecified female breast: Secondary | ICD-10-CM

## 2014-03-19 DIAGNOSIS — C50412 Malignant neoplasm of upper-outer quadrant of left female breast: Secondary | ICD-10-CM

## 2014-03-19 NOTE — Progress Notes (Signed)
ID: [name]   DOB: 27-Jan-1956  MR#: [id number]  LKG#:[id number]  PCP: Haywood Pao, MD GYN: Gus Height SU: Fanny Skates MD OTHER MD: Will Charlestine Night, Linus Mako, Gabriel Earing  CHIEF COMPLAINT:  Hx left Breast Cancer                    HISTORY OF PRESENT ILLNESS: [name] has a history of fibrocystic change.  She took hormone replacement for many years and also vitamin E, but weaned herself off these medications around November 2010.  After a little while she started noticing discomfort in the left breast.  She went back on vitamin E, but this does not take care of the problem and she was evaluated by Dr. Harrington Challenger who felt that repeat mammogram should be obtained, even though it was not quite a year from the last one.  [name] found the breast to be predominantly fatty, but in the area of the palpable abnormality there was a circumscribed lobular mass with slightly irregular margins.  The ultrasound showed a lobular septated cyst measuring 2.7 cm and other cysts were also noted corresponding to the palpable mass.  There was in addition a 4 mm area of hypoechoic shadowing that corresponded to the nodular component seen on mammography.  Biopsy of this new mass was recommended and performed by Audry Pili at the breast center on December 19, 2009.  This showed ([id number]) an invasive ductal carcinoma in the setting of fibrocystic change.  This appeared to be low-grade and the tumor was strongly positive for ER and PR at 95 and 99% respectively.  It had the tumor cells at a low proliferation marker at 7%, HER2 was not amplified with CISH ratio of 1.43.  Bilateral breast MRIs were obtained on December 26, 2009.  This showed a solitary 1.1 cm area of the regular enhancement in the upper inner left breast.  There were of course cluster of cysts also noted.  This was a secondary that had been biopsied on December 19, 2009 and had shown only fibrocystic change.  With this  information after appropriate discussion Dr. Dalbert Batman proceeded to left lumpectomy and sentinel lymph node sampling January 02, 2010.  The pathology from this procedure ([id number]) showed a 9 mm invasive ductal carcinoma, grade 2 with clear margins and no evidence of angiolymphatic invasion.  Two sentinel lymph nodes were clear.   Her subsequent history is as detailed below  INTERVAL HISTORY: [name] returns alone today for followup of her left breast carcinoma. She was last seen here in December 2014 at which time she was having increased pain and swelling in her left arm. A Doppler was negative for DVT. She was seen at the lymphedema clinic and has started some massage to that area. The swelling and pain has now resolved.   As a recap, [name] started on anastrozole following radiation in May of 2011, but this was discontinued due to poor tolerance. She also tried letrozole and tamoxifen, both of which were discontinued with poor tolerance. She started exemestane in August 2013 which was discontinued in September 2014 due to a concern of possible interactions with Synthroid. [name] has a chronic history of arthritis and joint pain, and has found that she "feels so much better" off of the exemestane, that at this point she is not interested in resuming. Currently, per her request, she is being followed with observation alone accordingly.    REVIEW OF SYSTEMS: [name] denies any recent  has had no fevers,  chills, night sweats, or hot flashes. Her energy level is  fairly good.  she has a history of psoriasis which is stable. She bruises easily. She denies any additional skin changes or rashes and has had no abnormal bleeding. Her appetite is good, and she denies any nausea, emesis, or change in bowel bladder habits. She's had no cough, phlegm production, increased swelling, chest pain, or palpitations. She is followed by Dr. Radford Pax for SVT, and sees her on an annual basis. [name] denies any abnormal headaches or  dizziness. She does have some slight blurred vision and "floaters" which are stable for her. She continues to be followed by Dr. Paulla Dolly 4 plantars fasciitis. As noted above, she has chronic joint pain and also has chronic back pain, both of which are stable. She denies any new or unusual myalgias, arthralgias, or bony pain. [name] had pain in the left breast with itching around the nipple for one day several weeks ago. This resolved quickly, and she's had no additional changes since that time.  A detailed review of systems is otherwise stable and noncontributory.    PAST MEDICAL HISTORY: Past Medical History  Diagnosis Date  . GERD (gastroesophageal reflux disease)     [facility]), neg for metaplasia '97, '08 (mod HH) 9.2011  . Anxiety   . SVT (supraventricular tachycardia)   . Osteoporosis   . Osteoporosis   . PONV (postoperative nausea and vomiting)   . Laryngitis 12-20-12    recent episode is resolving with Amoxicillin orally  . Arthritis     lower back and thumbs  . Thyroid adenoma 12-20-12    surgery planned 12-30-12  . Obesity   . Dyslipidemia   . Cancer     left breast-(12-12-09 radiation and surgery)-Dr. Jana Hakim -oncology  . Colon polyps     sm aden p 10/06 (difficult exam, needs propofol);neg 07/2010    PAST SURGICAL HISTORY: Past Surgical History  Procedure Laterality Date  . Breast surgery      left breast lumpectomy  . Eye surgery  12-20-12    right eye retina tear repair  . Thyroidectomy N/A 12/30/2012    Procedure: THYROIDECTOMY;  Surgeon: Earnstine Regal, MD;  Location: [facility];  Service: General;  Laterality: N/A;    FAMILY HISTORY Family History  Problem Relation Age of Onset  . Stroke Mother   . Heart disease Father   . Cancer Brother     melanoma   The patient's father died from cardiac arrest at the age of 86.  The patient's mother died at the age of 46 from a stroke.  The patient has one brother who died from melanoma at the age of 37.  GYNECOLOGIC  HISTORY:  (Reviewed 03/19/2014) She is GX, P0.  Last menstrual period 2004.  She took hormone replacement until November 2010.    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  They have a dog at home.  They are not church attenders.  ADVANCED DIRECTIVES: In place  HEALTH MAINTENANCE:  (Updated 03/19/2014) History  Substance Use Topics  . Smoking status: Former Smoker    Types: Cigarettes    Quit date: 01/31/2010  . Smokeless tobacco: Never Used  . Alcohol Use: Yes     Comment: social- occ     Colonoscopy:  Due in 2016  PAP:  April 2015, Dr. Harrington Challenger  Bone density:  Not on  File  Lipid panel: Not on File/[facility]    Allergies  Allergen Reactions  . Sulfa Drugs Cross Reactors Hives    Current Outpatient Prescriptions  Medication Sig Dispense Refill  . acetaminophen (TYLENOL) 500 MG tablet Take 500 mg by mouth every 6 (six) hours as needed for pain.      Marland Kitchen ALPRAZolam (XANAX) 0.5 MG tablet Take 0.5 mg by mouth 3 (three) times daily as needed for anxiety.       Marland Kitchen aspirin 81 MG tablet Take 81 mg by mouth every other day.       . Calcium Carbonate-Vitamin D (CALCIUM 600 + D PO) Take 1 tablet by mouth 2 (two) times daily.      . cetirizine (ZYRTEC) 10 MG tablet Take 10 mg by mouth daily.      Marland Kitchen diltiazem (CARDIZEM CD) 120 MG 24 hr capsule Take 1 capsule (120 mg total) by mouth every morning.  90 capsule  3  . Flaxseed, Linseed, (FLAXSEED OIL PO) Take 1 tablet by mouth every morning.      . halobetasol (ULTRAVATE) 0.05 % cream as needed.       . Levothyroxine Sodium (SYNTHROID PO) Take 250 mcg by mouth daily.      . Multiple Vitamins-Minerals (MULTIVITAMINS THER. W/MINERALS) TABS Take 1 tablet by mouth every morning.       . pantoprazole (PROTONIX) 40 MG tablet       . sertraline (ZOLOFT) 100 MG tablet Take 100 mg by mouth daily.      . sodium chloride (OCEAN) 0.65 % SOLN nasal  spray Place 1 spray into the nose at bedtime.      . traMADol (ULTRAM) 50 MG tablet Take 50 mg by mouth every 6 (six) hours as needed for pain. Maximum dose= 8 tablets per day.      . triazolam (HALCION) 0.25 MG tablet       . exemestane (AROMASIN) 25 MG tablet Take 1 tablet by mouth  daily  90 tablet  3   No current facility-administered medications for this visit.    OBJECTIVE: Middle-aged white woman in no acute distress Filed Vitals:   03/19/14 1303  BP: 120/74  Pulse: 68  Temp: 98 F (36.7 C)  Resp: 18     Body mass index is 36.17 kg/(m^2).    ECOG FS: 1 Filed Weights   03/19/14 1303  Weight: 224 lb (101.606 kg)   Physical Exam: HEENT:  Sclerae anicteric.  Oropharynx clear, pink, and moist. Neck supple. Trachea midline. Horizontal incision status post thyroidectomy.  NODES:  No cervical or supraclavicular lymphadenopathy palpated.  BREAST EXAM:  Right breast is unremarkable.   Left breast is status post lumpectomy with well-healed incision, no suspicious nodularity, and no skin changes. No nipple inversion, or nipple discharge. No evidence of local recurrence. Axillae are benign bilaterally for palpable lymphadenopathy. LUNGS:  Clear to auscultation bilaterally with good excursion.  No wheezes or rhonchi HEART:  Regular rate and rhythm. No murmur  ABDOMEN:  Soft, obese, nontender.   no organomegaly or masses palpated. Positive bowel sounds.  MSK:  No focal spinal tenderness to palpation. good range of motion bilaterally in the upper extremities.  EXTREMITIES:  Nonpitting pedal edema, equal bilaterally. No upper extremity lymphedema noted.  SKIN:  No visible rashes. No excessive ecchymoses. No petechiae. No pallor. Good skin turgor.  NEURO:  Nonfocal. Well oriented.   appropriate  affect.   LAB RESULTS: Lab Results  Component Value Date   WBC  4.6 03/12/2014   NEUTROABS 2.5 03/12/2014   HGB 13.4 03/12/2014   HCT 40.1 03/12/2014   MCV 93.4 03/12/2014   PLT 258 03/12/2014       Chemistry      Component Value Date/Time   NA 141 03/12/2014 0847   NA 141 12/31/2012 0516   K 4.0 03/12/2014 0847   K 4.1 12/31/2012 0516   CL 106 03/15/2013 1416   CL 108 12/31/2012 0516   CO2 26 03/12/2014 0847   CO2 27 12/31/2012 0516   BUN 11.4 03/12/2014 0847   BUN 9 12/31/2012 0516   CREATININE 0.9 03/12/2014 0847   CREATININE 0.75 12/31/2012 0516      Component Value Date/Time   CALCIUM 9.7 03/12/2014 0847   CALCIUM 9.2 12/31/2012 0516   ALKPHOS 53 03/12/2014 0847   ALKPHOS 49 03/30/2012 0820   AST 17 03/12/2014 0847   AST 17 03/30/2012 0820   ALT 15 03/12/2014 0847   ALT 18 03/30/2012 0820   BILITOT 0.76 03/12/2014 0847   BILITOT 0.5 03/30/2012 0820       STUDIES:  Most recent mammogram on 01/08/2014 was unremarkable.   ASSESSMENT: 58 y.o.   woman   (1)  status post left lumpectomy and sentinel lymph node biopsy March of 2011 for a T1b N0 (Stage IA) invasive ductal carcinoma, grade 2, strongly estrogen and progesterone receptor positive, HER-2 negative, with an MIB-1-1 of 7%.   (2)  She completed radiation in May of 2011   (3)  tried anastrozole and letrozole, with poor tolerance; discontinued tamoxifen November 2012 because of concerns regarding blood clots. Started exemestane 06/02/2012, interrupted September 2014 because of concern of possible interaction with Synthroid.  Patient has decided not to resume exemestane, as of May 2015.  (4) enlarging thyroid nodule s/p biopsy 70/48/8891 showing a follicular lesion of undetermined significance.  S/P total thyroidectomy on 12/30/2012  (5)  increased pain and edema in the left upper extremity in December 2014  - resolved   PLAN: With regards to her breast cancer, [name] seems to be doing well, and I see no clinical evidence of disease recurrence today. As noted above, she does not want to resume the exemestane, and wishes to be followed with observation alone at this point. Accordingly, we will plan on seeing her back in 6 months  for repeat labs and physical exam.   Her recent mammogram was unremarkable, but I have asked her to contact us if she has any additional itching or other changes in the left breast.  Otherwise, we will see her back in November, and in the meanwhile she will continue to be followed by her primary care physician, Dr. Osborne Casco.  The above plan was reviewed in detail with [name] today and she  voices  her understanding and agreement with this plan. She will call with any changesprior to her next scheduled appointment here in November.    [name] Milda Smart PA-C   03/19/2014

## 2014-03-20 ENCOUNTER — Telehealth: Payer: Self-pay | Admitting: Oncology

## 2014-03-20 NOTE — Telephone Encounter (Signed)
lmonvm for pt re appts for 11/12 and 11/19. schedule mailed.

## 2014-04-23 ENCOUNTER — Telehealth: Payer: Self-pay | Admitting: Cardiology

## 2014-04-23 MED ORDER — DILTIAZEM HCL ER COATED BEADS 120 MG PO CP24: 120.0000 mg | ORAL_CAPSULE | Freq: Every morning | ORAL | Status: DC

## 2014-04-23 NOTE — Telephone Encounter (Signed)
New message     Want a refill on her diltiazem to [facility]---30 day supply with refills.  Pt changed insurance.  No longer call prec in to opt rx.  This is effective 01-31-14

## 2014-04-25 ENCOUNTER — Emergency Department (HOSPITAL_COMMUNITY): Payer: BC Managed Care – PPO

## 2014-04-25 ENCOUNTER — Emergency Department (HOSPITAL_COMMUNITY)
Admission: EM | Admit: 2014-04-25 | Discharge: 2014-04-25 | Disposition: A | Discharge status: Home / Self Care | Payer: BC Managed Care – PPO | Attending: Emergency Medicine | Admitting: Emergency Medicine

## 2014-04-25 ENCOUNTER — Encounter (HOSPITAL_COMMUNITY): Payer: Self-pay | Admitting: Emergency Medicine

## 2014-04-25 DIAGNOSIS — Z862 Personal history of diseases of the blood and blood-forming organs and certain disorders involving the immune mechanism: Secondary | ICD-10-CM | POA: Diagnosis not present

## 2014-04-25 DIAGNOSIS — E669 Obesity, unspecified: Secondary | ICD-10-CM | POA: Insufficient documentation

## 2014-04-25 DIAGNOSIS — R1110 Vomiting, unspecified: Secondary | ICD-10-CM | POA: Diagnosis present

## 2014-04-25 DIAGNOSIS — Z8639 Personal history of other endocrine, nutritional and metabolic disease: Secondary | ICD-10-CM | POA: Diagnosis not present

## 2014-04-25 DIAGNOSIS — M129 Arthropathy, unspecified: Secondary | ICD-10-CM | POA: Insufficient documentation

## 2014-04-25 DIAGNOSIS — Z85850 Personal history of malignant neoplasm of thyroid: Secondary | ICD-10-CM | POA: Diagnosis not present

## 2014-04-25 DIAGNOSIS — Z860100 Personal history of colon polyps, unspecified: Secondary | ICD-10-CM | POA: Insufficient documentation

## 2014-04-25 DIAGNOSIS — R112 Nausea with vomiting, unspecified: Secondary | ICD-10-CM | POA: Diagnosis not present

## 2014-04-25 DIAGNOSIS — Z79899 Other long term (current) drug therapy: Secondary | ICD-10-CM | POA: Insufficient documentation

## 2014-04-25 DIAGNOSIS — R1011 Right upper quadrant pain: Secondary | ICD-10-CM | POA: Insufficient documentation

## 2014-04-25 DIAGNOSIS — F411 Generalized anxiety disorder: Secondary | ICD-10-CM | POA: Diagnosis not present

## 2014-04-25 DIAGNOSIS — R1031 Right lower quadrant pain: Secondary | ICD-10-CM | POA: Insufficient documentation

## 2014-04-25 DIAGNOSIS — Z853 Personal history of malignant neoplasm of breast: Secondary | ICD-10-CM | POA: Diagnosis not present

## 2014-04-25 DIAGNOSIS — K219 Gastro-esophageal reflux disease without esophagitis: Secondary | ICD-10-CM | POA: Diagnosis not present

## 2014-04-25 DIAGNOSIS — Z87891 Personal history of nicotine dependence: Secondary | ICD-10-CM | POA: Diagnosis not present

## 2014-04-25 LAB — URINALYSIS, ROUTINE W REFLEX MICROSCOPIC
Bilirubin Urine: NEGATIVE
Glucose, UA: NEGATIVE mg/dL
HGB URINE DIPSTICK: NEGATIVE
Ketones, ur: NEGATIVE mg/dL
Leukocytes, UA: NEGATIVE
NITRITE: NEGATIVE
Protein, ur: NEGATIVE mg/dL
SPECIFIC GRAVITY, URINE: 1.01 (ref 1.005–1.030)
UROBILINOGEN UA: 0.2 mg/dL (ref 0.0–1.0)
pH: 7 (ref 5.0–8.0)

## 2014-04-25 LAB — CBC WITH DIFFERENTIAL/PLATELET
BASOS ABS: 0 10*3/uL (ref 0.0–0.1)
Basophils Relative: 0 % (ref 0–1)
EOS PCT: 0 % (ref 0–5)
Eosinophils Absolute: 0 10*3/uL (ref 0.0–0.7)
HCT: 39.4 % (ref 36.0–46.0)
Hemoglobin: 13.6 g/dL (ref 12.0–15.0)
LYMPHS ABS: 1 10*3/uL (ref 0.7–4.0)
Lymphocytes Relative: 18 % (ref 12–46)
MCH: 31.3 pg (ref 26.0–34.0)
MCHC: 34.5 g/dL (ref 30.0–36.0)
MCV: 90.8 fL (ref 78.0–100.0)
Monocytes Absolute: 0.6 10*3/uL (ref 0.1–1.0)
Monocytes Relative: 11 % (ref 3–12)
Neutro Abs: 4.1 10*3/uL (ref 1.7–7.7)
Neutrophils Relative %: 71 % (ref 43–77)
Platelets: 318 10*3/uL (ref 150–400)
RBC: 4.34 MIL/uL (ref 3.87–5.11)
RDW: 13 % (ref 11.5–15.5)
WBC: 5.8 10*3/uL (ref 4.0–10.5)

## 2014-04-25 LAB — COMPREHENSIVE METABOLIC PANEL
ALT: 15 U/L (ref 0–35)
AST: 20 U/L (ref 0–37)
Albumin: 3.9 g/dL (ref 3.5–5.2)
Alkaline Phosphatase: 60 U/L (ref 39–117)
BUN: 10 mg/dL (ref 6–23)
CALCIUM: 9.6 mg/dL (ref 8.4–10.5)
CO2: 24 mEq/L (ref 19–32)
CREATININE: 0.75 mg/dL (ref 0.50–1.10)
Chloride: 103 mEq/L (ref 96–112)
GFR calc non Af Amer: >90 mL/min (ref >90)
Glucose, Bld: 133 mg/dL — ABNORMAL HIGH (ref 70–99)
Potassium: 3.5 mEq/L — ABNORMAL LOW (ref 3.7–5.3)
SODIUM: 142 meq/L (ref 137–147)
TOTAL PROTEIN: 7.3 g/dL (ref 6.0–8.3)
Total Bilirubin: 0.4 mg/dL (ref 0.3–1.2)

## 2014-04-25 LAB — LIPASE, BLOOD: Lipase: 44 U/L (ref 11–59)

## 2014-04-25 MED ORDER — ONDANSETRON HCL 4 MG/2ML IJ SOLN
4.0000 mg | Freq: Once | INTRAMUSCULAR | Status: AC
  Administered 2014-04-25: 4 mg via INTRAVENOUS
  Filled 2014-04-25: qty 2

## 2014-04-25 MED ORDER — ALPRAZOLAM 0.25 MG PO TABS
0.5000 mg | ORAL_TABLET | Freq: Once | ORAL | Status: AC
  Administered 2014-04-25: 0.5 mg via ORAL
  Filled 2014-04-25: qty 2

## 2014-04-25 MED ORDER — IOHEXOL 300 MG/ML  SOLN
100.0000 mL | Freq: Once | INTRAMUSCULAR | Status: AC | PRN
  Administered 2014-04-25: 100 mL via INTRAVENOUS

## 2014-04-25 MED ORDER — SODIUM CHLORIDE 0.9 % IV BOLUS (SEPSIS)
1000.0000 mL | INTRAVENOUS | Status: AC
  Administered 2014-04-25: 1000 mL via INTRAVENOUS

## 2014-04-25 MED ORDER — PROMETHAZINE HCL 25 MG RE SUPP: 25.0000 mg | Freq: Four times a day (QID) | RECTAL | Status: DC | PRN

## 2014-04-25 NOTE — ED Provider Notes (Signed)
CSN: [id number]     Arrival date & time 04/25/14  1226 History   First MD Initiated Contact with Patient 04/25/14 1621     Chief Complaint  Patient presents with  . Emesis  . Abdominal Pain     (Consider location/radiation/quality/duration/timing/severity/associated sxs/prior Treatment) Patient is a 58 y.o. female presenting with vomiting and abdominal pain. The history is provided by the patient.  Emesis Severity:  Moderate Duration:  1 day Timing:  Intermittent Quality:  Stomach contents Able to tolerate:  Liquids Progression:  Partially resolved Chronicity:  New Recent urination:  Normal Relieved by:  Nothing Worsened by:  Nothing tried Ineffective treatments:  None tried Associated symptoms: abdominal pain   Associated symptoms: no diarrhea and no headaches   Abdominal pain:    Location:  RUQ and RLQ   Quality:  Aching   Severity:  Mild   Onset quality:  Gradual   Duration:  3 weeks   Timing:  Constant   Progression:  Worsening   Chronicity:  New Abdominal Pain Associated symptoms: nausea and vomiting   Associated symptoms: no chest pain, no cough, no diarrhea, no dysuria, no fatigue, no fever, no hematuria and no shortness of breath     Past Medical History  Diagnosis Date  . GERD (gastroesophageal reflux disease)     [facility]), neg for metaplasia '97, '08 (mod HH) 9.2011  . Anxiety   . SVT (supraventricular tachycardia)   . Osteoporosis   . Osteoporosis   . PONV (postoperative nausea and vomiting)   . Laryngitis 12-20-12    recent episode is resolving with Amoxicillin orally  . Arthritis     lower back and thumbs  . Thyroid adenoma 12-20-12    surgery planned 12-30-12  . Obesity   . Dyslipidemia   . Cancer     left breast-(12-12-09 radiation and surgery)-Dr. Jana Hakim -oncology  . Colon polyps     sm aden p 10/06 (difficult exam, needs propofol);neg 07/2010   Past Surgical History  Procedure Laterality Date  . Breast surgery      left breast  lumpectomy  . Eye surgery  12-20-12    right eye retina tear repair  . Thyroidectomy N/A 12/30/2012    Procedure: THYROIDECTOMY;  Surgeon: Earnstine Regal, MD;  Location: [facility]S;  Service: General;  Laterality: N/A;   Family History  Problem Relation Age of Onset  . Stroke Mother   . Heart disease Father   . Cancer Brother     melanoma   History  Substance Use Topics  . Smoking status: Former Smoker    Types: Cigarettes    Quit date: 01/31/2010  . Smokeless tobacco: Never Used  . Alcohol Use: Yes     Comment: social- occ   OB History   Grav Para Term Preterm Abortions TAB SAB Ect Mult Living                 Review of Systems  Constitutional: Negative for fever and fatigue.  HENT: Negative for congestion and drooling.   Eyes: Negative for pain.  Respiratory: Negative for cough and shortness of breath.   Cardiovascular: Negative for chest pain.  Gastrointestinal: Positive for nausea, vomiting and abdominal pain. Negative for diarrhea.  Genitourinary: Negative for dysuria and hematuria.  Musculoskeletal: Negative for back pain, gait problem and neck pain.  Skin: Negative for color change.  Neurological: Negative for dizziness and headaches.  Hematological: Negative for adenopathy.  Psychiatric/Behavioral: Negative for behavioral problems.  All  other systems reviewed and are negative.     Allergies  Sulfa drugs cross reactors  Home Medications   Prior to Admission medications   Medication Sig Start Date End Date Taking? Authorizing Provider  acetaminophen (TYLENOL) 500 MG tablet Take 500 mg by mouth every 6 (six) hours as needed for pain.   Yes Historical Provider, MD  ALPRAZolam Duanne Moron) 0.5 MG tablet Take 0.5 mg by mouth 3 (three) times daily as needed for anxiety.    Yes Historical Provider, MD  aspirin 81 MG tablet Take 81 mg by mouth daily.    Yes Historical Provider, MD  Calcium Carbonate-Vitamin D (CALCIUM 600 + D PO) Take 1 tablet by mouth 2 (two) times daily.    Yes Historical Provider, MD  cetirizine (ZYRTEC) 10 MG tablet Take 10 mg by mouth daily.   Yes Historical Provider, MD  diltiazem (CARDIZEM CD) 120 MG 24 hr capsule Take 1 capsule (120 mg total) by mouth every morning. 04/23/14  Yes Sueanne Margarita, MD  doxycycline (ADOXA) 100 MG tablet Take 100 mg by mouth 2 (two) times daily. Started on 04-22-14 for 7 days   Yes Historical Provider, MD  Flaxseed, Linseed, (FLAXSEED OIL PO) Take 1 tablet by mouth every morning.   Yes Historical Provider, MD  halobetasol (ULTRAVATE) 0.05 % cream as needed.  09/13/13  Yes Historical Provider, MD  levothyroxine (SYNTHROID, LEVOTHROID) 137 MCG tablet Take 274 mcg by mouth daily before breakfast.   Yes Historical Provider, MD  Multiple Vitamins-Minerals (MULTIVITAMINS THER. W/MINERALS) TABS Take 1 tablet by mouth every morning.    Yes Historical Provider, MD  pantoprazole (PROTONIX) 40 MG tablet  02/21/14  Yes Historical Provider, MD  sertraline (ZOLOFT) 100 MG tablet Take 100 mg by mouth daily.   Yes Historical Provider, MD  sodium chloride (OCEAN) 0.65 % SOLN nasal spray Place 1 spray into the nose at bedtime.   Yes Historical Provider, MD  traMADol (ULTRAM) 50 MG tablet Take 50 mg by mouth every 6 (six) hours as needed for pain. Maximum dose= 8 tablets per day.   Yes Historical Provider, MD  triazolam (HALCION) 0.25 MG tablet Take 0.25 mg by mouth at bedtime as needed for sleep.  02/27/14  Yes Historical Provider, MD   BP 128/72  Pulse 68  Temp(Src) 97.6 F (36.4 C) (Oral)  Resp 20  SpO2 98% Physical Exam  Nursing note and vitals reviewed. Constitutional: She is oriented to person, place, and time. She appears well-developed and well-nourished.  HENT:  Head: Normocephalic.  Mouth/Throat: Oropharynx is clear and moist. No oropharyngeal exudate.  Eyes: Conjunctivae and EOM are normal. Pupils are equal, round, and reactive to light.  Neck: Normal range of motion. Neck supple.  Cardiovascular: Normal rate, regular  rhythm, normal heart sounds and intact distal pulses.  Exam reveals no gallop and no friction rub.   No murmur heard. Pulmonary/Chest: Effort normal and breath sounds normal. No respiratory distress. She has no wheezes.  Abdominal: Soft. Bowel sounds are normal. There is tenderness (mild tenderness to palpation of the right side of the abdomen, slightly worse in the right upper quadrant.). There is no rebound and no guarding.  Genitourinary:  Healing wound to right lower inguinal area. No significant erythema or evidence of infection  Musculoskeletal: Normal range of motion. She exhibits no edema and no tenderness.  Neurological: She is alert and oriented to person, place, and time.  Skin: Skin is warm and dry.  Psychiatric: She has a normal mood and  affect. Her behavior is normal.    ED Course  Procedures (including critical care time) Labs Review Labs Reviewed  COMPREHENSIVE METABOLIC PANEL - Abnormal; Notable for the following:    Potassium 3.5 (*)    Glucose, Bld 133 (*)    All other components within normal limits  CBC WITH DIFFERENTIAL  LIPASE, BLOOD  URINALYSIS, ROUTINE W REFLEX MICROSCOPIC    Imaging Review Ct Abdomen Pelvis W Contrast  04/25/2014   CLINICAL DATA:  Nausea, vomiting and fatigue.  EXAM: CT ABDOMEN AND PELVIS WITH CONTRAST  TECHNIQUE: Multidetector CT imaging of the abdomen and pelvis was performed using the standard protocol following bolus administration of intravenous contrast.  CONTRAST:  1107mL OMNIPAQUE IOHEXOL 300 MG/ML  SOLN  COMPARISON:  None.  FINDINGS: The lung bases are clear. No pleural effusion or pulmonary nodule. A small Bochdalek's hernia is noted along with a moderate-sized hiatal hernia. The heart is normal in size. No pericardial effusion.  The liver is unremarkable. No focal hepatic lesions or intrahepatic biliary dilatation. The gallbladder is normal. No common bile duct dilatation. The pancreas is normal. The spleen is normal. The adrenal glands  and kidneys are unremarkable.  The stomach, duodenum, small bowel and colon are unremarkable. No inflammatory changes, mass lesions or obstructive findings. The appendix is normal. No mesenteric or retroperitoneal mass or adenopathy. The aorta and branch vessels are patent. Minimal distal atherosclerotic calcifications. The major venous structures are patent.  The uterus demonstrates a 3.5 cm left-sided fibroid. Both ovaries are normal. The bladder is normal. No pelvic mass, adenopathy or free pelvic fluid collections. No inguinal mass or adenopathy.  The bony structures are unremarkable.  IMPRESSION: No acute abdominal/ pelvic findings, mass lesions or lymphadenopathy.  Moderate-sized hiatal hernia.   Electronically Signed   By: Kalman Jewels M.D.   On: 04/25/2014 18:07     EKG Interpretation None      MDM   Final diagnoses:  Right lower quadrant abdominal pain  Right upper quadrant abdominal pain    5:08 PM 58 y.o. female who presents with vague right-sided abdominal discomfort for the last few weeks. She notes that nothing seems to worsen her pain. She states that after eating barbecue last night she became very nauseous and has been vomiting throughout the night. Since that time her right-sided abdominal pain is worsened. She currently rates her pain a 1/10. She is nauseous. She denies any fevers. She is afebrile and vital signs are unremarkable here. Her pain is slightly worse in the right upper quadrant but she also notes occasionally right lower quadrant pain. Will plan on getting screening labs and a CT scan. She does not want any pain medicine currently.  7:12 PM: Pt feeling better. I interpreted/reviewed the labs and/or imaging which were non-contributory. Pt knows about hiatal hernia which was mentioned on CT. Unknown cause of chronic abd pain, acute sx likely d/t food ingestion vs viral illness.  I have discussed the diagnosis/risks/treatment options with the patient and believe the pt  to be eligible for discharge home to follow-up with pcp this week. We also discussed returning to the ED immediately if new or worsening sx occur. We discussed the sx which are most concerning (e.g., worsening pain, fever, vomiting) that necessitate immediate return. Medications administered to the patient during their visit and any new prescriptions provided to the patient are listed below.  Medications given during this visit Medications  sodium chloride 0.9 % bolus 1,000 mL (1,000 mLs Intravenous New Bag/Given 04/25/14  1712)  ondansetron (ZOFRAN) injection 4 mg (4 mg Intravenous Given 04/25/14 1711)  ALPRAZolam (XANAX) tablet 0.5 mg (0.5 mg Oral Given 04/25/14 1711)  iohexol (OMNIPAQUE) 300 MG/ML solution 100 mL (100 mLs Intravenous Contrast Given 04/25/14 1750)    Discharge Medication List as of 04/25/2014  7:14 PM    START taking these medications   Details  promethazine (PHENERGAN) 25 MG suppository Place 1 suppository (25 mg total) rectally every 6 (six) hours as needed for nausea or vomiting., Starting 04/25/2014, Until Discontinued, Print         Blanchard Kelch, MD 04/26/14 1630

## 2014-04-25 NOTE — ED Notes (Addendum)
Pt reports onset yesterday evening of n/v and fatigue. Reports recently being treated by ob/gyn with doxy for an abscess on upper thigh. But also having intermittent RLQ x 2 months. And noticed a red rash on her face this am.

## 2014-04-25 NOTE — ED Notes (Signed)
Apologized to pt for wait time. Pt reports continued 3/10 pain. Denies wanting anything for pain at this time. NAD.

## 2014-04-25 NOTE — Discharge Instructions (Signed)
Abdominal Pain, Women °Abdominal (stomach, pelvic, or belly) pain can be caused by many things. It is important to tell your doctor: °· The location of the pain. °· Does it come and go or is it present all the time? °· Are there things that start the pain (eating certain foods, exercise)? °· Are there other symptoms associated with the pain (fever, nausea, vomiting, diarrhea)? °All of this is helpful to know when trying to find the cause of the pain. °CAUSES  °· Stomach: virus or bacteria infection, or ulcer. °· Intestine: appendicitis (inflamed appendix), regional ileitis (Crohn's disease), ulcerative colitis (inflamed colon), irritable bowel syndrome, diverticulitis (inflamed diverticulum of the colon), or cancer of the stomach or intestine. °· Gallbladder disease or stones in the gallbladder. °· Kidney disease, kidney stones, or infection. °· Pancreas infection or cancer. °· Fibromyalgia (pain disorder). °· Diseases of the female organs: °¨ Uterus: fibroid (non-cancerous) tumors or infection. °¨ Fallopian tubes: infection or tubal pregnancy. °¨ Ovary: cysts or tumors. °¨ Pelvic adhesions (scar tissue). °¨ Endometriosis (uterus lining tissue growing in the pelvis and on the pelvic organs). °¨ Pelvic congestion syndrome (female organs filling up with blood just before the menstrual period). °¨ Pain with the menstrual period. °¨ Pain with ovulation (producing an egg). °¨ Pain with an IUD (intrauterine device, birth control) in the uterus. °¨ Cancer of the female organs. °· Functional pain (pain not caused by a disease, may improve without treatment). °· Psychological pain. °· Depression. °DIAGNOSIS  °Your doctor will decide the seriousness of your pain by doing an examination. °· Blood tests. °· X-rays. °· Ultrasound. °· CT scan (computed tomography, special type of X-ray). °· MRI (magnetic resonance imaging). °· Cultures, for infection. °· Barium enema (dye inserted in the large intestine, to better view it with  X-rays). °· Colonoscopy (looking in intestine with a lighted tube). °· Laparoscopy (minor surgery, looking in abdomen with a lighted tube). °· Major abdominal exploratory surgery (looking in abdomen with a large incision). °TREATMENT  °The treatment will depend on the cause of the pain.  °· Many cases can be observed and treated at home. °· Over-the-counter medicines recommended by your caregiver. °· Prescription medicine. °· Antibiotics, for infection. °· Birth control pills, for painful periods or for ovulation pain. °· Hormone treatment, for endometriosis. °· Nerve blocking injections. °· Physical therapy. °· Antidepressants. °· Counseling with a psychologist or psychiatrist. °· Minor or major surgery. °HOME CARE INSTRUCTIONS  °· Do not take laxatives, unless directed by your caregiver. °· Take over-the-counter pain medicine only if ordered by your caregiver. Do not take aspirin because it can cause an upset stomach or bleeding. °· Try a clear liquid diet (broth or water) as ordered by your caregiver. Slowly move to a bland diet, as tolerated, if the pain is related to the stomach or intestine. °· Have a thermometer and take your temperature several times a day, and record it. °· Bed rest and sleep, if it helps the pain. °· Avoid sexual intercourse, if it causes pain. °· Avoid stressful situations. °· Keep your follow-up appointments and tests, as your caregiver orders. °· If the pain does not go away with medicine or surgery, you may try: °¨ Acupuncture. °¨ Relaxation exercises (yoga, meditation). °¨ Group therapy. °¨ Counseling. °SEEK MEDICAL CARE IF:  °· You notice certain foods cause stomach pain. °· Your home care treatment is not helping your pain. °· You need stronger pain medicine. °· You want your IUD removed. °· You feel faint or   lightheaded. °· You develop nausea and vomiting. °· You develop a rash. °· You are having side effects or an allergy to your medicine. °SEEK IMMEDIATE MEDICAL CARE IF:  °· Your  pain does not go away or gets worse. °· You have a fever. °· Your pain is felt only in portions of the abdomen. The right side could possibly be appendicitis. The left lower portion of the abdomen could be colitis or diverticulitis. °· You are passing blood in your stools (bright red or black tarry stools, with or without vomiting). °· You have blood in your urine. °· You develop chills, with or without a fever. °· You pass out. °MAKE SURE YOU:  °· Understand these instructions. °· Will watch your condition. °· Will get help right away if you are not doing well or get worse. °Document Released: 08/16/2007 Document Revised: 01/11/2012 Document Reviewed: 09/05/2009 °[employer]® Patient Information ©2015 [employer]. This information is not intended to replace advice given to you by your health care provider. Make sure you discuss any questions you have with your health care provider. ° °

## 2014-09-13 ENCOUNTER — Other Ambulatory Visit (HOSPITAL_BASED_OUTPATIENT_CLINIC_OR_DEPARTMENT_OTHER): Payer: BC Managed Care – PPO

## 2014-09-13 DIAGNOSIS — C50219 Malignant neoplasm of upper-inner quadrant of unspecified female breast: Secondary | ICD-10-CM

## 2014-09-13 DIAGNOSIS — C50412 Malignant neoplasm of upper-outer quadrant of left female breast: Secondary | ICD-10-CM

## 2014-09-13 LAB — CBC WITH DIFFERENTIAL/PLATELET
BASO%: 0.3 % (ref 0.0–2.0)
Basophils Absolute: 0 10*3/uL (ref 0.0–0.1)
EOS%: 2 % (ref 0.0–7.0)
Eosinophils Absolute: 0.1 10*3/uL (ref 0.0–0.5)
HCT: 39.1 % (ref 34.8–46.6)
HGB: 13.1 g/dL (ref 11.6–15.9)
LYMPH%: 28.7 % (ref 14.0–49.7)
MCH: 31.3 pg (ref 25.1–34.0)
MCHC: 33.5 g/dL (ref 31.5–36.0)
MCV: 93.5 fL (ref 79.5–101.0)
MONO#: 0.7 10*3/uL (ref 0.1–0.9)
MONO%: 10.7 % (ref 0.0–14.0)
NEUT#: 3.8 10*3/uL (ref 1.5–6.5)
NEUT%: 58.3 % (ref 38.4–76.8)
Platelets: 330 10*3/uL (ref 145–400)
RBC: 4.18 10*6/uL (ref 3.70–5.45)
RDW: 13.1 % (ref 11.2–14.5)
WBC: 6.5 10*3/uL (ref 3.9–10.3)
lymph#: 1.9 10*3/uL (ref 0.9–3.3)

## 2014-09-13 LAB — COMPREHENSIVE METABOLIC PANEL (CC13)
ALBUMIN: 3.7 g/dL (ref 3.5–5.0)
ALT: 16 U/L (ref 0–55)
ANION GAP: 5 meq/L (ref 3–11)
AST: 18 U/L (ref 5–34)
Alkaline Phosphatase: 64 U/L (ref 40–150)
BUN: 17.4 mg/dL (ref 7.0–26.0)
CO2: 26 meq/L (ref 22–29)
Calcium: 9.4 mg/dL (ref 8.4–10.4)
Chloride: 106 mEq/L (ref 98–109)
Creatinine: 0.9 mg/dL (ref 0.6–1.1)
GLUCOSE: 81 mg/dL (ref 70–140)
Potassium: 4.4 mEq/L (ref 3.5–5.1)
SODIUM: 137 meq/L (ref 136–145)
TOTAL PROTEIN: 6.6 g/dL (ref 6.4–8.3)
Total Bilirubin: 0.51 mg/dL (ref 0.20–1.20)

## 2014-09-13 NOTE — Progress Notes (Addendum)
ID: [name]   DOB: [birth year]  MR#: [id number]  [facility]U#:[id number]  PCP: Tina Pao, MD GYN: [name] SU: Tina Skates MD OTHER MD: [name], [name], [name]  CHIEF COMPLAINT:  Hx left Breast Cancer CURRENT TREATMENT: none, observation                 BREAST CANCER HISTORY: [name] has a history of fibrocystic change.  She took hormone replacement for many years and also vitamin E, but weaned herself off these medications around November 2010.  After a little while she started noticing discomfort in the left breast.  She went back on vitamin E, but this does not take care of the problem and she was evaluated by Dr. Harrington Cameron who felt that repeat mammogram should be obtained, even though it was not quite a year from the last one.  [name] found the breast to be predominantly fatty, but in the area of the palpable abnormality there was a circumscribed lobular mass with slightly irregular margins.  The ultrasound showed a lobular septated cyst measuring 2.7 cm and other cysts were also noted corresponding to the palpable mass.  There was in addition a 4 mm area of hypoechoic shadowing that corresponded to the nodular component seen on mammography.  Biopsy of this new mass was recommended and performed by [name] at the breast center on December 19, 2009.  This showed ([id number]) an invasive ductal carcinoma in the setting of fibrocystic change.  This appeared to be low-grade and the tumor was strongly positive for ER and PR at 95 and 99% respectively.  It had the tumor cells at a low proliferation marker at 7%, HER2 was not amplified with CISH ratio of 1.43.  Bilateral breast MRIs were obtained on December 26, 2009.  This showed a solitary 1.1 cm area of the regular enhancement in the upper inner left breast.  There were of course cluster of cysts also noted.  This was a secondary that had been biopsied on December 19, 2009 and had shown only  fibrocystic change.  With this information after appropriate discussion [name] proceeded to left lumpectomy and sentinel lymph node sampling January 02, 2010.  The pathology from this procedure ([id number]) showed a 9 mm invasive ductal carcinoma, grade 2 with clear margins and no evidence of angiolymphatic invasion.  Two sentinel lymph nodes were clear.   Her subsequent history is as detailed below  INTERVAL HISTORY: [name] returns today for follow up of her breast cancer. On her last visit she decided not to proceed with anti-estrogen therapy because of poor tolerance on several drugs. We Tina proceed with observation alone at this point. The interval history is generally unremarkable. She has been diagnosed with "dry eyes" but is attempting to avoid having to use restasis because of costs. She walks daily for exercise. She says could "pay more attention" to her diet as she is gaining weight, but she stays well hydrated at least.   REVIEW OF SYSTEMS: [name] denies fevers, chills, nausea, or vomiting. She endorses some constipation, but she attributes this to the lack of fiber in her diet. She has no shortness of breath, chest pains, cough, palpitations, or fatigue. She has some fleeing right breast pain, but does not feel it today. She has no headaches or dizziness.  A detailed review of systems is otherwise stable and noncontributory.    PAST MEDICAL HISTORY: Past Medical History  Diagnosis Date  . GERD (gastroesophageal reflux disease)  [facility]), neg for metaplasia '97, '08 (mod HH) 9.2011  . Anxiety   . SVT (supraventricular tachycardia)   . Osteoporosis   . Osteoporosis   . PONV (postoperative nausea and vomiting)   . Laryngitis 12-20-12    recent episode is resolving with Amoxicillin orally  . Arthritis     lower back and thumbs  . Thyroid adenoma 12-20-12    surgery planned 12-30-12  . Obesity   . Dyslipidemia   . Cancer     left breast-(12-12-09 radiation and  surgery)-Dr. Jana Hakim -oncology  . Colon polyps     sm aden p 10/06 (difficult exam, needs propofol);neg 07/2010    PAST SURGICAL HISTORY: Past Surgical History  Procedure Laterality Date  . Breast surgery      left breast lumpectomy  . Eye surgery  12-20-12    right eye retina tear repair  . Thyroidectomy N/A 12/30/2012    Procedure: THYROIDECTOMY;  Surgeon: Earnstine Regal, MD;  Location: [facility];  Service: General;  Laterality: N/A;    FAMILY HISTORY Family History  Problem Relation Age of Onset  . Stroke Mother   . Heart disease Father   . Cancer Brother     melanoma   The patient's father died from cardiac arrest at the age of 45.  The patient's mother died at the age of 27 from a stroke.  The patient has one brother who died from melanoma at the age of 67.  GYNECOLOGIC HISTORY:  (Reviewed 03/19/2014) She is GX, P0.  Last menstrual period 2004.  She took hormone replacement until November 2010.    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  They have a dog at home.  They are not church attenders.  ADVANCED DIRECTIVES: In place  HEALTH MAINTENANCE:  (Updated 03/19/2014) History  Substance Use Topics  . Smoking status: Former Smoker    Types: Cigarettes    Quit date: 01/31/2010  . Smokeless tobacco: Never Used  . Alcohol Use: Yes     Comment: social- occ     Colonoscopy:  Due in 2016  PAP:  April 2015, Dr. Harrington Cameron  Bone density:  Not on File  Lipid panel: Not on File/[facility]    Allergies  Allergen Reactions  . Sulfa Drugs Cross Reactors Hives    Current Outpatient Prescriptions  Medication Sig Dispense Refill  . acetaminophen (TYLENOL) 500 MG tablet Take 500 mg by mouth every 6 (six) hours as needed for pain.    Marland Kitchen ALPRAZolam (XANAX) 0.5 MG tablet Take 0.5 mg by mouth 3 (three) times daily as needed for anxiety.     Marland Kitchen aspirin 81 MG tablet Take 81 mg by  mouth daily.     . Calcium Carbonate-Vitamin D (CALCIUM 600 + D PO) Take 1 tablet by mouth 2 (two) times daily. $RemoveBefo'600mg'[id number]$     . cetirizine (ZYRTEC) 10 MG tablet Take 10 mg by mouth daily.    Marland Kitchen diltiazem (CARDIZEM CD) 120 MG 24 hr capsule Take 1 capsule (120 mg total) by mouth every morning. 30 capsule 6  . Flaxseed, Linseed, (FLAXSEED OIL PO) Take 1 tablet by mouth every morning.    . halobetasol (ULTRAVATE) 0.05 % cream as needed.     Marland Kitchen levothyroxine (SYNTHROID, LEVOTHROID) 137 MCG tablet Take 274 mcg by mouth daily before breakfast.    . Multiple Vitamins-Minerals (MULTIVITAMINS THER. W/MINERALS) TABS Take 1  tablet by mouth every morning.     . pantoprazole (PROTONIX) 40 MG tablet     . promethazine (PHENERGAN) 25 MG suppository Place 1 suppository (25 mg total) rectally every 6 (six) hours as needed for nausea or vomiting. 20 each 0  . sertraline (ZOLOFT) 100 MG tablet Take 100 mg by mouth daily.    . sodium chloride (OCEAN) 0.65 % SOLN nasal spray Place 1 spray into the nose at bedtime.    . traMADol (ULTRAM) 50 MG tablet Take 50 mg by mouth every 6 (six) hours as needed for pain. Maximum dose= 8 tablets per day.    . triazolam (HALCION) 0.25 MG tablet Take 0.25 mg by mouth at bedtime as needed for sleep.      No current facility-administered medications for this visit.    OBJECTIVE: Middle-aged white woman in no acute distress There were no vitals filed for this visit.   There is no weight on file to calculate BMI.    ECOG FS: 1 There were no vitals filed for this visit.  Physical Exam: Skin: warm, dry  HEENT: sclerae anicteric, conjunctivae pink, oropharynx clear. No thrush or mucositis.  Lymph Nodes: No cervical or supraclavicular lymphadenopathy  Lungs: clear to auscultation bilaterally, no rales, wheezes, or rhonci  Heart: regular rate and rhythm  Abdomen: round, soft, non tender, positive bowel sounds  Musculoskeletal: No focal spinal tenderness, no peripheral edema  Neuro: non  focal, well oriented, positive affect  Breasts: left breast status post lumpectomy and radiation. No evidence of recurrent disease. Left axilla benign. Right breast unremarkable.   LAB RESULTS: Lab Results  Component Value Date   WBC 6.5 09/13/2014   NEUTROABS 3.8 09/13/2014   HGB 13.1 09/13/2014   HCT 39.1 09/13/2014   MCV 93.5 09/13/2014   PLT 330 09/13/2014      Chemistry      Component Value Date/Time   NA 137 09/13/2014 1518   NA 142 04/25/2014 1244   K 4.4 09/13/2014 1518   K 3.5* 04/25/2014 1244   CL 103 04/25/2014 1244   CL 106 03/15/2013 1416   CO2 26 09/13/2014 1518   CO2 24 04/25/2014 1244   BUN 17.4 09/13/2014 1518   BUN 10 04/25/2014 1244   CREATININE 0.9 09/13/2014 1518   CREATININE 0.75 04/25/2014 1244      Component Value Date/Time   CALCIUM 9.4 09/13/2014 1518   CALCIUM 9.6 04/25/2014 1244   ALKPHOS 64 09/13/2014 1518   ALKPHOS 60 04/25/2014 1244   AST 18 09/13/2014 1518   AST 20 04/25/2014 1244   ALT 16 09/13/2014 1518   ALT 15 04/25/2014 1244   BILITOT 0.51 09/13/2014 1518   BILITOT 0.4 04/25/2014 1244       STUDIES: Most recent mammogram on 01/08/2014 was unremarkable.  ASSESSMENT: 58 y.o.  [place] woman   (1)  status post left lumpectomy and sentinel lymph node biopsy March of 2011 for a T1b N0 (Stage IA) invasive ductal carcinoma, grade 2, strongly estrogen and progesterone receptor positive, HER-2 negative, with an MIB-1-1 of 7%.   (2)  She completed radiation in May of 2011   (3)  tried anastrozole and letrozole, with poor tolerance; discontinued tamoxifen November 2012 because of concerns regarding blood clots. Started exemestane 06/02/2012, interrupted September 2014 because of concern of possible interaction with Synthroid.  Patient has decided not to resume exemestane, as of May 2015.  (4) enlarging thyroid nodule s/p biopsy 99/83/3825 showing a follicular lesion of undetermined significance.  S/P total thyroidectomy on  12/30/2012  (5)  increased pain and edema in the left upper extremity in December 2014  - resolved   PLAN: [name] is doing well as far as her breast cancer is concerned. She is now 4 years out from her definitive surgery with no evidence of recurrent disease. The labs were reviewed in detail and were entirely stable.   The next mammogram Tina be due in March 2016. [name] Tina return in 6 months for labs and a follow up visit with Dr. Jana Hakim. At this time she Tina 5 years and hopefully disease free. She Tina be eligible to "graduate" from follow up visits. She understands and agrees with this plan. She has been encouraged to call with any issues that might arise before her next visit here.   Marcelino Duster, NP   09/20/2014

## 2014-09-20 ENCOUNTER — Encounter: Payer: BC Managed Care – PPO | Admitting: Nurse Practitioner

## 2014-09-20 ENCOUNTER — Telehealth: Payer: Self-pay | Admitting: Nurse Practitioner

## 2014-09-20 ENCOUNTER — Ambulatory Visit: Payer: BC Managed Care – PPO

## 2014-09-20 ENCOUNTER — Encounter: Payer: Self-pay | Admitting: Nurse Practitioner

## 2014-09-20 VITALS — BP 132/67 | HR 72 | Temp 98.2°F | Resp 20 | Wt 227.9 lb

## 2014-09-20 DIAGNOSIS — C50412 Malignant neoplasm of upper-outer quadrant of left female breast: Secondary | ICD-10-CM

## 2014-09-20 NOTE — Addendum Note (Signed)
Addended by: Marcelino Duster on: 09/20/2014 09:10 PM   Modules accepted: Miquel Dunn

## 2014-09-20 NOTE — Telephone Encounter (Signed)
per pof to sch pt appt-sch & gave pt copy of sch °

## 2014-09-20 NOTE — Addendum Note (Signed)
Addended by: Marcelino Duster on: 09/20/2014 03:16 PM   Modules accepted: Medications

## 2014-09-20 NOTE — Addendum Note (Signed)
Addended by: Marcelino Duster on: 09/20/2014 03:42 PM   Modules accepted: Orders, Medications, Level of Service

## 2014-11-30 ENCOUNTER — Other Ambulatory Visit: Payer: Self-pay | Admitting: Obstetrics and Gynecology

## 2014-11-30 DIAGNOSIS — Z853 Personal history of malignant neoplasm of breast: Secondary | ICD-10-CM

## 2014-11-30 DIAGNOSIS — Z9889 Other specified postprocedural states: Secondary | ICD-10-CM

## 2014-12-24 ENCOUNTER — Other Ambulatory Visit: Payer: Self-pay | Admitting: *Deleted

## 2014-12-24 MED ORDER — DILTIAZEM HCL ER COATED BEADS 120 MG PO CP24: 120.0000 mg | ORAL_CAPSULE | Freq: Every morning | ORAL | Status: DC

## 2015-01-10 ENCOUNTER — Ambulatory Visit
Admission: RE | Admit: 2015-01-10 | Discharge: 2015-01-10 | Disposition: A | Discharge status: Home / Self Care | Payer: BLUE CROSS/BLUE SHIELD | Source: Ambulatory Visit | Attending: Obstetrics and Gynecology | Admitting: Obstetrics and Gynecology

## 2015-01-10 DIAGNOSIS — Z9889 Other specified postprocedural states: Secondary | ICD-10-CM

## 2015-01-10 DIAGNOSIS — Z853 Personal history of malignant neoplasm of breast: Secondary | ICD-10-CM

## 2015-01-11 ENCOUNTER — Ambulatory Visit (INDEPENDENT_AMBULATORY_CARE_PROVIDER_SITE_OTHER): Payer: BLUE CROSS/BLUE SHIELD | Admitting: Cardiology

## 2015-01-11 ENCOUNTER — Encounter: Payer: Self-pay | Admitting: Cardiology

## 2015-01-11 VITALS — BP 130/82 | HR 63 | Ht 67.0 in | Wt 234.4 lb

## 2015-01-11 DIAGNOSIS — I471 Supraventricular tachycardia: Secondary | ICD-10-CM

## 2015-01-11 MED ORDER — DILTIAZEM HCL ER COATED BEADS 120 MG PO CP24: 120.0000 mg | ORAL_CAPSULE | Freq: Every morning | ORAL | Status: DC

## 2015-01-11 NOTE — Progress Notes (Signed)
Cardiology Office Note   Date:  01/11/2015   ID:  [name], DOB 24-Dec-1955, MRN [id number]  PCP:  Haywood Pao, MD  Cardiologist:   Sueanne Margarita, MD   Chief Complaint  Patient presents with  . svt      History of Present Illness: [name] is a 59 y.o. female with a history of SVT who presents today for followup. She is doing well. She denies any chest pain, SOB, DOE, dizziness or syncope. She says that she has had a lot of stress recently and is closing down a business. She has some problems with anxiety.   She occasionally has palpitations which only last a few seconds and are stable on meds.She has had both Cartia and Diltiazem meds but thinks the Diltiazem works better.  She also noticed they are worse when she is stressed.  She occasionally has some LE edema in her LLE.     Past Medical History  Diagnosis Date  . GERD (gastroesophageal reflux disease)     [facility]), neg for metaplasia '97, '08 (mod HH) 9.2011  . Anxiety   . SVT (supraventricular tachycardia)   . Osteoporosis   . Osteoporosis   . PONV (postoperative nausea and vomiting)   . Laryngitis 12-20-12    recent episode is resolving with Amoxicillin orally  . Arthritis     lower back and thumbs  . Thyroid adenoma 12-20-12    surgery planned 12-30-12  . Obesity   . Dyslipidemia   . Cancer     left breast-(12-12-09 radiation and surgery)-Dr. Jana Hakim -oncology  . Colon polyps     sm aden p 10/06 (difficult exam, needs propofol);neg 07/2010    Past Surgical History  Procedure Laterality Date  . Breast surgery      left breast lumpectomy  . Eye surgery  12-20-12    right eye retina tear repair  . Thyroidectomy N/A 12/30/2012    Procedure: THYROIDECTOMY;  Surgeon: Earnstine Regal, MD;  Location: [facility];  Service: General;  Laterality: N/A;     Current Outpatient Prescriptions  Medication Sig Dispense Refill  . acetaminophen (TYLENOL) 500 MG tablet Take 500 mg by mouth  every 6 (six) hours as needed for pain.    Marland Kitchen ALPRAZolam (XANAX) 0.5 MG tablet Take 0.5 mg by mouth 3 (three) times daily as needed for anxiety.     Marland Kitchen aspirin 81 MG tablet Take 81 mg by mouth daily.     . Calcium Carbonate-Vitamin D (CALCIUM 600 + D PO) Take 1 tablet by mouth 2 (two) times daily. 600mg     . cetirizine (ZYRTEC) 10 MG tablet Take 10 mg by mouth daily.    Marland Kitchen diltiazem (CARDIZEM CD) 120 MG 24 hr capsule Take 1 capsule (120 mg total) by mouth every morning. 30 capsule 11  . Flaxseed, Linseed, (FLAXSEED OIL PO) Take 1 tablet by mouth every morning.    . halobetasol (ULTRAVATE) 0.05 % cream as needed.     Marland Kitchen levothyroxine (SYNTHROID, LEVOTHROID) 137 MCG tablet Take 274 mcg by mouth daily before breakfast.    . Multiple Vitamins-Minerals (MULTIVITAMINS THER. W/MINERALS) TABS Take 1 tablet by mouth every morning.     . pantoprazole (PROTONIX) 40 MG tablet     . RESTASIS 0.05 % ophthalmic emulsion Apply 2 drops to eye 2 (two) times daily. Both eyes  0  . sertraline (ZOLOFT) 100 MG tablet Take 100 mg by mouth daily.    . sodium chloride (  OCEAN) 0.65 % SOLN nasal spray Place 1 spray into the nose at bedtime.    . traMADol (ULTRAM) 50 MG tablet Take 50 mg by mouth every 6 (six) hours as needed for pain. Maximum dose= 8 tablets per day.    . triazolam (HALCION) 0.25 MG tablet Take 0.25 mg by mouth at bedtime as needed for sleep.      No current facility-administered medications for this visit.    Allergies:   Sulfa drugs cross reactors    Social History:  The patient  reports that she quit smoking about 4 years ago. Her smoking use included Cigarettes. She has never used smokeless tobacco. She reports that she drinks alcohol. She reports that she does not use illicit drugs.   Family History:  The patient's family history includes Cancer in her brother; Heart disease in her father; Stroke in her mother.    ROS:  Please see the history of present illness.   Otherwise, review of systems are  positive for none.   All other systems are reviewed and negative.    PHYSICAL EXAM: VS:  BP 130/82 mmHg  Pulse 63  Ht 5\' 7"  (1.702 m)  Wt 234 lb 6.4 oz (106.323 kg)  BMI 36.70 kg/m2 , BMI Body mass index is 36.7 kg/(m^2). GEN: Well nourished, well developed, in no acute distress HEENT: normal Neck: no JVD, carotid bruits, or masses Cardiac: RRR; no murmurs, rubs, or gallops,no edema  Respiratory:  clear to auscultation bilaterally, normal work of breathing GI: soft, nontender, nondistended, + BS MS: no deformity or atrophy Skin: warm and dry, no rash Neuro:  Strength and sensation are intact Psych: euthymic mood, full affect   EKG:  EKG was ordered today and showed NSR with no ST changes    Recent Labs: 03/12/2014: TSH 4.487* 09/13/2014: ALT 16; BUN 17.4; Creatinine 0.9; Hemoglobin 13.1; Platelets 330; Potassium 4.4; Sodium 137    Lipid Panel No results found for: CHOL, TRIG, HDL, CHOLHDL, VLDL, LDLCALC, LDLDIRECT    Wt Readings from Last 3 Encounters:  01/11/15 234 lb 6.4 oz (106.323 kg)  09/20/14 227 lb 14.4 oz (103.375 kg)  03/19/14 224 lb (101.606 kg)       ASSESSMENT AND PLAN:  1. PSVT which is well controlled happening about once a week for only a few seconds - continue Diltiazem   Current medicines are reviewed at length with the patient today.  The patient does not have concerns regarding medicines.  The following changes have been made:  no change  Labs/ tests ordered today include:   Orders Placed This Encounter  Procedures  . EKG 12-Lead     Disposition:   FU with me in 1 year   Signed, Sueanne Margarita, MD  01/11/2015 8:49 AM    [facility] [address], [place], [place]  [address] Phone: [phone]; Fax: [phone]

## 2015-01-11 NOTE — Patient Instructions (Signed)
Your physician recommends that you continue on your current medications as directed. Please refer to the Current Medication list given to you today. I refilled your Diltiazem for 1 year  Your physician wants you to follow-up in: 1 year with Dr. Radford Pax. You will receive a reminder letter in the mail two months in advance. If you don't receive a letter, please call our office to schedule the follow-up appointment @ [phone].

## 2015-02-20 ENCOUNTER — Other Ambulatory Visit: Payer: Self-pay | Admitting: Obstetrics and Gynecology

## 2015-02-21 LAB — CYTOLOGY - PAP

## 2015-03-12 ENCOUNTER — Other Ambulatory Visit: Payer: Self-pay | Admitting: *Deleted

## 2015-03-12 DIAGNOSIS — C50412 Malignant neoplasm of upper-outer quadrant of left female breast: Secondary | ICD-10-CM

## 2015-03-13 ENCOUNTER — Other Ambulatory Visit (HOSPITAL_BASED_OUTPATIENT_CLINIC_OR_DEPARTMENT_OTHER): Payer: BLUE CROSS/BLUE SHIELD

## 2015-03-13 DIAGNOSIS — Z853 Personal history of malignant neoplasm of breast: Secondary | ICD-10-CM | POA: Diagnosis not present

## 2015-03-13 DIAGNOSIS — C50412 Malignant neoplasm of upper-outer quadrant of left female breast: Secondary | ICD-10-CM

## 2015-03-13 LAB — COMPREHENSIVE METABOLIC PANEL (CC13)
ALK PHOS: 69 U/L (ref 40–150)
ALT: 16 U/L (ref 0–55)
AST: 20 U/L (ref 5–34)
Albumin: 3.6 g/dL (ref 3.5–5.0)
Anion Gap: 10 mEq/L (ref 3–11)
BUN: 13.7 mg/dL (ref 7.0–26.0)
CALCIUM: 9 mg/dL (ref 8.4–10.4)
CO2: 23 mEq/L (ref 22–29)
CREATININE: 0.9 mg/dL (ref 0.6–1.1)
Chloride: 108 mEq/L (ref 98–109)
EGFR: 70 mL/min/{1.73_m2} — ABNORMAL LOW (ref >90)
Glucose: 99 mg/dl (ref 70–140)
Potassium: 4 mEq/L (ref 3.5–5.1)
Sodium: 142 mEq/L (ref 136–145)
Total Bilirubin: 0.5 mg/dL (ref 0.20–1.20)
Total Protein: 6.6 g/dL (ref 6.4–8.3)

## 2015-03-13 LAB — CBC WITH DIFFERENTIAL/PLATELET
BASO%: 0.3 % (ref 0.0–2.0)
Basophils Absolute: 0 10*3/uL (ref 0.0–0.1)
EOS%: 1.4 % (ref 0.0–7.0)
Eosinophils Absolute: 0.1 10*3/uL (ref 0.0–0.5)
HCT: 37.7 % (ref 34.8–46.6)
HEMOGLOBIN: 12.5 g/dL (ref 11.6–15.9)
LYMPH%: 24.5 % (ref 14.0–49.7)
MCH: 29.9 pg (ref 25.1–34.0)
MCHC: 33.2 g/dL (ref 31.5–36.0)
MCV: 90.2 fL (ref 79.5–101.0)
MONO#: 0.5 10*3/uL (ref 0.1–0.9)
MONO%: 9 % (ref 0.0–14.0)
NEUT#: 3.7 10*3/uL (ref 1.5–6.5)
NEUT%: 64.8 % (ref 38.4–76.8)
PLATELETS: 264 10*3/uL (ref 145–400)
RBC: 4.18 10*6/uL (ref 3.70–5.45)
RDW: 13.2 % (ref 11.2–14.5)
WBC: 5.8 10*3/uL (ref 3.9–10.3)
lymph#: 1.4 10*3/uL (ref 0.9–3.3)

## 2015-03-20 ENCOUNTER — Ambulatory Visit (HOSPITAL_BASED_OUTPATIENT_CLINIC_OR_DEPARTMENT_OTHER): Payer: BLUE CROSS/BLUE SHIELD | Admitting: Oncology

## 2015-03-20 VITALS — BP 130/77 | HR 64 | Temp 97.9°F | Resp 18 | Ht 67.0 in | Wt 235.9 lb

## 2015-03-20 DIAGNOSIS — Z853 Personal history of malignant neoplasm of breast: Secondary | ICD-10-CM | POA: Diagnosis not present

## 2015-03-20 DIAGNOSIS — C50412 Malignant neoplasm of upper-outer quadrant of left female breast: Secondary | ICD-10-CM

## 2015-03-20 NOTE — Progress Notes (Signed)
ID: MAELIN KURKOWSKI   DOB: [birth year]  MR#: [id number]  PVV#:[id number]  PCP: Haywood Pao, MD GYN: Gus Height SU: Fanny Skates MD OTHER MD: Will Charlestine Night, Linus Mako, Gabriel Earing  CHIEF COMPLAINT:  Hx left Breast Cancer  CURRENT TREATMENT: Observation                    HISTORY OF PRESENT ILLNESS: From the original intake note:  [name] has a history of fibrocystic change.  She took hormone replacement for many years and also vitamin E, but weaned herself off these medications around November 2010.  After a little while she started noticing discomfort in the left breast.  She went back on vitamin E, but this does not take care of the problem and she was evaluated by Dr. Harrington Challenger who felt that repeat mammogram should be obtained, even though it was not quite a year from the last one.  [name] found the breast to be predominantly fatty, but in the area of the palpable abnormality there was a circumscribed lobular mass with slightly irregular margins.  The ultrasound showed a lobular septated cyst measuring 2.7 cm and other cysts were also noted corresponding to the palpable mass.  There was in addition a 4 mm area of hypoechoic shadowing that corresponded to the nodular component seen on mammography.  Biopsy of this new mass was recommended and performed by Audry Pili at the breast center on December 19, 2009.  This showed ([id number]) an invasive ductal carcinoma in the setting of fibrocystic change.  This appeared to be low-grade and the tumor was strongly positive for ER and PR at 95 and 99% respectively.  It had the tumor cells at a low proliferation marker at 7%, HER2 was not amplified with CISH ratio of 1.43.  Bilateral breast MRIs were obtained on December 26, 2009.  This showed a solitary 1.1 cm area of the regular enhancement in the upper inner left breast.  There were of course cluster of cysts also noted.  This was a secondary that had been biopsied on  December 19, 2009 and had shown only fibrocystic change.  With this information after appropriate discussion Dr. Dalbert Batman proceeded to left lumpectomy and sentinel lymph node sampling January 02, 2010.  The pathology from this procedure ([id number]) showed a 9 mm invasive ductal carcinoma, grade 2 with clear margins and no evidence of angiolymphatic invasion.  Two sentinel lymph nodes were clear.   Her subsequent history is as detailed below  INTERVAL HISTORY: [name] returns today for follow-up of her breast cancer. She is now 5 years out from her original surgery and radiated to graduate.  REVIEW OF SYSTEMS: [name] lost one of her dogs recently and of course is still grieving and that. She is not exercising as regularly as she would like to. She has significant arthritis problems especially at the base of both thumbs. She's noted a little dimpling in her left breast that she wanted me to look at. She found that her visual changes were really do to dry eye and she is now doing better on Restasis. A detailed review of systems is otherwise noncontributory  PAST MEDICAL HISTORY: Past Medical History  Diagnosis Date  . GERD (gastroesophageal reflux disease)     [facility]), neg for metaplasia '97, '08 (mod HH) 9.2011  . Anxiety   . SVT (supraventricular tachycardia)   . Osteoporosis   . Osteoporosis   . PONV (postoperative nausea and vomiting)   . Laryngitis  12-20-12    recent episode is resolving with Amoxicillin orally  . Arthritis     lower back and thumbs  . Thyroid adenoma 12-20-12    surgery planned 12-30-12  . Obesity   . Dyslipidemia   . Cancer     left breast-(12-12-09 radiation and surgery)-Dr. Jana Hakim -oncology  . Colon polyps     sm aden p 10/06 (difficult exam, needs propofol);neg 07/2010    PAST SURGICAL HISTORY: Past Surgical History  Procedure Laterality Date  . Breast surgery      left breast lumpectomy  . Eye surgery  12-20-12    right eye retina tear repair  .  Thyroidectomy N/A 12/30/2012    Procedure: THYROIDECTOMY;  Surgeon: Earnstine Regal, MD;  Location: [facility];  Service: General;  Laterality: N/A;    FAMILY HISTORY Family History  Problem Relation Age of Onset  . Stroke Mother   . Heart disease Father   . Cancer Brother     melanoma   The patient's father died from cardiac arrest at the age of 77.  The patient's mother died at the age of 72 from a stroke.  The patient has one brother who died from melanoma at the age of 37.  GYNECOLOGIC HISTORY:  (Reviewed 03/19/2014) She is GX, P0.  Last menstrual period 2004.  She took hormone replacement until November 2010.    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  They have a dog at home.  They are not church attenders.  ADVANCED DIRECTIVES: In place  HEALTH MAINTENANCE:  (Updated 03/19/2014) History  Substance Use Topics  . Smoking status: Former Smoker    Types: Cigarettes    Quit date: 01/31/2010  . Smokeless tobacco: Never Used  . Alcohol Use: Yes     Comment: social- occ     Colonoscopy:  Due in 2016  PAP:  April 2015, Dr. Harrington Challenger  Bone density:  Not on File  Lipid panel: Not on File/[facility]    Allergies  Allergen Reactions  . Sulfa Drugs Cross Reactors Hives    Current Outpatient Prescriptions  Medication Sig Dispense Refill  . acetaminophen (TYLENOL) 500 MG tablet Take 500 mg by mouth every 6 (six) hours as needed for pain.    Marland Kitchen ALPRAZolam (XANAX) 0.5 MG tablet Take 0.5 mg by mouth 3 (three) times daily as needed for anxiety.     Marland Kitchen aspirin 81 MG tablet Take 81 mg by mouth daily.     . Calcium Carbonate-Vitamin D (CALCIUM 600 + D PO) Take 1 tablet by mouth 2 (two) times daily. $RemoveBefo'600mg'KemFQPSbPNx$     . cetirizine (ZYRTEC) 10 MG tablet Take 10 mg by mouth daily.    Marland Kitchen diltiazem (CARDIZEM CD) 120 MG 24 hr capsule Take 1 capsule (120 mg total) by mouth every morning. 30 capsule 11   . Flaxseed, Linseed, (FLAXSEED OIL PO) Take 1 tablet by mouth every morning.    . halobetasol (ULTRAVATE) 0.05 % cream as needed.     Marland Kitchen levothyroxine (SYNTHROID, LEVOTHROID) 137 MCG tablet Take 274 mcg by mouth daily before breakfast.    . Multiple Vitamins-Minerals (MULTIVITAMINS THER. W/MINERALS) TABS Take 1 tablet by mouth every morning.     . pantoprazole (PROTONIX) 40 MG tablet     . RESTASIS 0.05 % ophthalmic emulsion Apply 2 drops to eye 2 (two) times daily. Both eyes  0  .  sertraline (ZOLOFT) 100 MG tablet Take 100 mg by mouth daily.    . sodium chloride (OCEAN) 0.65 % SOLN nasal spray Place 1 spray into the nose at bedtime.    . traMADol (ULTRAM) 50 MG tablet Take 50 mg by mouth every 6 (six) hours as needed for pain. Maximum dose= 8 tablets per day.    . triazolam (HALCION) 0.25 MG tablet Take 0.25 mg by mouth at bedtime as needed for sleep.      No current facility-administered medications for this visit.    OBJECTIVE: Middle-aged white woman who appears stated age 34 Vitals:   03/20/15 1523  BP: 130/77  Pulse: 64  Temp: 97.9 F (36.6 C)  Resp: 18     Body mass index is 36.94 kg/(m^2).    ECOG FS: 1 Filed Weights   03/20/15 1523  Weight: 235 lb 14.4 oz (107.004 kg)   Sclerae unicteric, EOMs intact Oropharynx clear and moist No cervical or supraclavicular adenopathy Lungs no rales or rhonchi Heart regular rate and rhythm Abd soft, obese, nontender, positive bowel sounds MSK no focal spinal tenderness, no upper extremity lymphedema Neuro: nonfocal, well oriented, appropriate affect Breasts: The right breast is unremarkable. The left breast is status post lumpectomy and radiation. The dimpling she is noticing is extremely minor, it is lateral in the breast at approximately the level of the nipple, and at the very most a would be an area of fat shrinkage. There is no palpable mass and really there is no evidence of disease recurrence. The left axilla is benign  LAB  RESULTS: Lab Results  Component Value Date   WBC 5.8 03/13/2015   NEUTROABS 3.7 03/13/2015   HGB 12.5 03/13/2015   HCT 37.7 03/13/2015   MCV 90.2 03/13/2015   PLT 264 03/13/2015      Chemistry      Component Value Date/Time   NA 142 03/13/2015 1203   NA 142 04/25/2014 1244   K 4.0 03/13/2015 1203   K 3.5* 04/25/2014 1244   CL 103 04/25/2014 1244   CL 106 03/15/2013 1416   CO2 23 03/13/2015 1203   CO2 24 04/25/2014 1244   BUN 13.7 03/13/2015 1203   BUN 10 04/25/2014 1244   CREATININE 0.9 03/13/2015 1203   CREATININE 0.75 04/25/2014 1244      Component Value Date/Time   CALCIUM 9.0 03/13/2015 1203   CALCIUM 9.6 04/25/2014 1244   ALKPHOS 69 03/13/2015 1203   ALKPHOS 60 04/25/2014 1244   AST 20 03/13/2015 1203   AST 20 04/25/2014 1244   ALT 16 03/13/2015 1203   ALT 15 04/25/2014 1244   BILITOT 0.50 03/13/2015 1203   BILITOT 0.4 04/25/2014 1244       STUDIES: CLINICAL DATA: Status left lumpectomy and radiation therapy for breast cancer in 2011.  EXAM: DIGITAL DIAGNOSTIC BILATERAL MAMMOGRAM WITH 3D TOMOSYNTHESIS AND CAD  COMPARISON: Previous examinations.  ACR Breast Density Category b: There are scattered areas of fibroglandular density.  FINDINGS: Stable post lumpectomy changes on the left. No new findings suspicious for malignancy in either breast.  Mammographic images were processed with CAD.  IMPRESSION: No evidence of malignancy.  RECOMMENDATION: Bilateral diagnostic mammogram in 1 year.  I have discussed the findings and recommendations with the patient. Results were also provided in writing at the conclusion of the visit. If applicable, a reminder letter will be sent to the patient regarding the next appointment.  BI-RADS CATEGORY 2: Benign.   Electronically Signed  By: Claudie Revering  M.D.  On: 01/10/2015 13:07  ASSESSMENT: 59 y.o.  [place] woman   (1)  status post left lumpectomy and sentinel lymph node biopsy March  of 2011 for a T1b N0 (Stage IA) invasive ductal carcinoma, grade 2, strongly estrogen and progesterone receptor positive, HER-2 negative, with an MIB-1-1 of 7%.   (2)  She completed radiation in May of 2011   (3)  tried anastrozole and letrozole, with poor tolerance; discontinued tamoxifen November 2012 because of concerns regarding blood clots. Started exemestane 06/02/2012, interrupted September 2014 because of concern of possible interaction with Synthroid.  Patient has decided not to resume exemestane, as of May 2015.  (4) enlarging thyroid nodule s/p biopsy [id number]/6681 showing a follicular lesion of undetermined significance.  S/P total thyroidectomy on 12/30/2012  (5)  increased pain and edema in the left upper extremity in December 2014  - resolved   PLAN: [name] has completed 5 years from her original diagnosis. At this point I am comfortable releasing her back to her primary care physician. She will need a yearly physician breast exam, which she will obtain through Dr. Harrington Challenger, and yearly mammography, preferably with tomography.  Of course I will be glad to see her again at any point if the need arises, but as of now we are making no further routine appointments for her here. Chauncey Cruel, MD    03/20/2015

## 2015-12-10 ENCOUNTER — Other Ambulatory Visit: Payer: Self-pay | Admitting: Obstetrics and Gynecology

## 2015-12-10 ENCOUNTER — Other Ambulatory Visit: Payer: Self-pay

## 2015-12-10 DIAGNOSIS — Z853 Personal history of malignant neoplasm of breast: Secondary | ICD-10-CM

## 2016-01-19 ENCOUNTER — Other Ambulatory Visit: Payer: Self-pay | Admitting: Cardiology

## 2016-01-24 ENCOUNTER — Ambulatory Visit
Admission: RE | Admit: 2016-01-24 | Discharge: 2016-01-24 | Disposition: A | Discharge status: Home / Self Care | Payer: BLUE CROSS/BLUE SHIELD | Source: Ambulatory Visit | Attending: Obstetrics and Gynecology | Admitting: Obstetrics and Gynecology

## 2016-01-24 DIAGNOSIS — Z853 Personal history of malignant neoplasm of breast: Secondary | ICD-10-CM

## 2016-02-07 ENCOUNTER — Ambulatory Visit (INDEPENDENT_AMBULATORY_CARE_PROVIDER_SITE_OTHER): Payer: BLUE CROSS/BLUE SHIELD | Admitting: Cardiology

## 2016-02-07 ENCOUNTER — Encounter: Payer: Self-pay | Admitting: Cardiology

## 2016-02-07 VITALS — BP 124/86 | HR 66 | Ht 65.5 in | Wt 243.0 lb

## 2016-02-07 DIAGNOSIS — R609 Edema, unspecified: Secondary | ICD-10-CM

## 2016-02-07 DIAGNOSIS — I471 Supraventricular tachycardia: Secondary | ICD-10-CM | POA: Diagnosis not present

## 2016-02-07 DIAGNOSIS — R600 Localized edema: Secondary | ICD-10-CM

## 2016-02-07 DIAGNOSIS — R9431 Abnormal electrocardiogram [ECG] [EKG]: Secondary | ICD-10-CM | POA: Diagnosis not present

## 2016-02-07 HISTORY — DX: Localized edema: R60.0

## 2016-02-07 MED ORDER — DILTIAZEM HCL ER COATED BEADS 120 MG PO CP24: ORAL_CAPSULE | ORAL | Status: DC

## 2016-02-07 NOTE — Patient Instructions (Signed)
Medication Instructions:  None  Labwork: None  Testing/Procedures: Your physician has requested that you have an echocardiogram. Echocardiography is a painless test that uses sound waves to create images of your heart. It provides your doctor with information about the size and shape of your heart and how well your heart's chambers and valves are working. This procedure takes approximately one hour. There are no restrictions for this procedure.    Follow-Up: Your physician wants you to follow-up in: 1 year with Dr. Turner.  You will receive a reminder letter in the mail two months in advance. If you don't receive a letter, please call our office to schedule the follow-up appointment.   Any Other Special Instructions Will Be Listed Below (If Applicable).     If you need a refill on your cardiac medications before your next appointment, please call your pharmacy.   

## 2016-02-07 NOTE — Progress Notes (Signed)
Cardiology Office Note    Date:  02/07/2016   ID:  [name], DOB [birth year], MRN [id number]  PCP:  Haywood Pao, MD  Cardiologist:  Sueanne Margarita, MD   Chief Complaint  Patient presents with  . SVT    History of Present Illness:  [name] is a 60 y.o. female with a history of SVT who presents today for followup. She is doing well. She denies any chest pain, SOB, DOE, dizziness, palpitations or syncope.  She occasionally has some LE edema in her LLE that is exacerbated by sitting at her job.     Past Medical History  Diagnosis Date  . GERD (gastroesophageal reflux disease)     [facility]'s [facility]), neg for metaplasia '97, '08 (mod HH) 9.2011  . Anxiety   . SVT (supraventricular tachycardia) ([place])   . Osteoporosis   . Osteoporosis   . PONV (postoperative nausea and vomiting)   . Laryngitis 12-20-12    recent episode is resolving with Amoxicillin orally  . Arthritis     lower back and thumbs  . Thyroid adenoma 12-20-12    surgery planned 12-30-12  . Obesity   . Dyslipidemia   . Cancer [facility])     left breast-(12-12-09 radiation and surgery)-Dr. Jana Hakim -oncology  . Colon polyps     sm aden p 10/06 (difficult exam, needs propofol);neg 07/2010  . Edema extremities 02/07/2016    Past Surgical History  Procedure Laterality Date  . Breast surgery      left breast lumpectomy  . Eye surgery  12-20-12    right eye retina tear repair  . Thyroidectomy N/A 12/30/2012    Procedure: THYROIDECTOMY;  Surgeon: Earnstine Regal, MD;  Location: [facility];  Service: General;  Laterality: N/A;    Current Medications: Outpatient Prescriptions Prior to Visit  Medication Sig Dispense Refill  . acetaminophen (TYLENOL) 500 MG tablet Take 500 mg by mouth every 6 (six) hours as needed for pain.    Marland Kitchen ALPRAZolam (XANAX) 0.5 MG tablet Take 0.5 mg by mouth 3 (three) times daily as needed for anxiety.     Marland Kitchen aspirin 81 MG tablet Take 81 mg by mouth daily.     . Calcium  Carbonate-Vitamin D (CALCIUM 600 + D PO) Take 1 tablet by mouth 2 (two) times daily. 600mg     . CARTIA XT 120 MG 24 hr capsule TAKE ONE CAPSULE BY MOUTH IN THE MORNING. 30 capsule 0  . Flaxseed, Linseed, (FLAXSEED OIL PO) Take 1 tablet by mouth every evening.     . halobetasol (ULTRAVATE) 0.05 % cream as needed.     Marland Kitchen levothyroxine (SYNTHROID, LEVOTHROID) 137 MCG tablet Take 274 mcg by mouth daily before breakfast.    . pantoprazole (PROTONIX) 40 MG tablet Take 40 mg by mouth 2 (two) times daily.     . RESTASIS 0.05 % ophthalmic emulsion Apply 2 drops to eye 2 (two) times daily. Both eyes  0  . sertraline (ZOLOFT) 100 MG tablet Take 100 mg by mouth daily.    . sodium chloride (OCEAN) 0.65 % SOLN nasal spray Place 1 spray into the nose at bedtime.    . traMADol (ULTRAM) 50 MG tablet Take 50 mg by mouth every 6 (six) hours as needed for pain. Maximum dose= 8 tablets per day.    . triazolam (HALCION) 0.25 MG tablet Take 0.25 mg by mouth at bedtime as needed for sleep.      No facility-administered medications prior to  visit.     Allergies:   Sulfa drugs cross reactors   Social History   Social History  . Marital Status: Married    Spouse Name: N/A  . Number of Children: N/A  . Years of Education: N/A   Social History Main Topics  . Smoking status: Former Smoker    Types: Cigarettes    Quit date: 01/31/2010  . Smokeless tobacco: Never Used  . Alcohol Use: Yes     Comment: social- occ  . Drug Use: No  . Sexual Activity: Yes    Birth Control/ Protection: Post-menopausal   Other Topics Concern  . None   Social History Narrative     Family History:  The patient's family history includes Cancer in her brother; Heart disease in her father; Stroke in her mother.   ROS:   Please see the history of present illness.    ROS All other systems reviewed and are negative.   PHYSICAL EXAM:   VS:  BP 124/86 mmHg  Pulse 66  Ht 5' 5.5" (1.664 m)  Wt 243 lb (110.224 kg)  BMI 39.81 kg/m2    GEN: Well nourished, well developed, in no acute distress HEENT: normal Neck: no JVD, carotid bruits, or masses Cardiac: RRR; no murmurs, rubs, or gallops,no edema.  Intact distal pulses bilaterally.  Respiratory:  clear to auscultation bilaterally, normal work of breathing GI: soft, nontender, nondistended, + BS MS: no deformity or atrophy Skin: warm and dry, no rash Neuro:  Alert and Oriented x 3, Strength and sensation are intact Psych: euthymic mood, full affect  Wt Readings from Last 3 Encounters:  02/07/16 243 lb (110.224 kg)  03/20/15 235 lb 14.4 oz (107.004 kg)  01/11/15 234 lb 6.4 oz (106.323 kg)      Studies/Labs Reviewed:   EKG:  EKG is ordered today.  The ekg ordered today demonstrates NSR at 67bpm with nonspecific ST abnormality  Recent Labs: 03/13/2015: ALT 16; BUN 13.7; Creatinine 0.9; HGB 12.5; Platelets 264; Potassium 4.0; Sodium 142   Lipid Panel No results found for: CHOL, TRIG, HDL, CHOLHDL, VLDL, LDLCALC, LDLDIRECT  Additional studies/ records that were reviewed today include:  none    ASSESSMENT:    1. Paroxysmal supraventricular tachycardia ([place])   2. Abnormal EKG   3. Edema extremities      PLAN:  In order of problems listed above:  1. SVT with no reoccurence.  Continue CCB.  2. Abnormal EKG - she has new nonspecific ST changes in the precordial leads but is asymptomatic.  I will get a 2D echo to asess LVF.  If normal then no further workup. 3. LE edema of the left leg from old injury.  She would like a new prescription for TED hose stockings.      Medication Adjustments/Labs and Tests Ordered: Current medicines are reviewed at length with the patient today.  Concerns regarding medicines are outlined above.  Medication changes, Labs and Tests ordered today are listed in the Patient Instructions below. There are no Patient Instructions on file for this visit.   Lurena Nida, MD  02/07/2016 3:37 PM    [facility]  [facility] [address], [place], [place]  [address] Phone: [phone]; Fax: [phone]

## 2016-02-26 ENCOUNTER — Ambulatory Visit (HOSPITAL_COMMUNITY): Payer: BLUE CROSS/BLUE SHIELD | Attending: Cardiovascular Disease

## 2016-02-26 ENCOUNTER — Other Ambulatory Visit: Payer: Self-pay

## 2016-02-26 DIAGNOSIS — E785 Hyperlipidemia, unspecified: Secondary | ICD-10-CM | POA: Diagnosis not present

## 2016-02-26 DIAGNOSIS — I517 Cardiomegaly: Secondary | ICD-10-CM | POA: Diagnosis not present

## 2016-02-26 DIAGNOSIS — I059 Rheumatic mitral valve disease, unspecified: Secondary | ICD-10-CM | POA: Insufficient documentation

## 2016-02-26 DIAGNOSIS — R9431 Abnormal electrocardiogram [ECG] [EKG]: Secondary | ICD-10-CM | POA: Diagnosis not present

## 2016-02-27 ENCOUNTER — Telehealth: Payer: Self-pay | Admitting: Cardiology

## 2016-02-27 DIAGNOSIS — Z6841 Body Mass Index (BMI) 40.0 and over, adult: Secondary | ICD-10-CM | POA: Diagnosis not present

## 2016-02-27 DIAGNOSIS — Z01419 Encounter for gynecological examination (general) (routine) without abnormal findings: Secondary | ICD-10-CM | POA: Diagnosis not present

## 2016-02-27 NOTE — Telephone Encounter (Signed)
-----   Message from Sueanne Margarita, MD sent at 02/26/2016 11:04 AM EDT ----- Echo showed normal LVF with with mild LAE

## 2016-02-27 NOTE — Telephone Encounter (Signed)
Informed patient of results and verbal understanding expressed.  

## 2016-02-27 NOTE — Telephone Encounter (Signed)
New message ° ° ° ° °Returning a call to the nurse °

## 2016-03-18 DIAGNOSIS — L821 Other seborrheic keratosis: Secondary | ICD-10-CM | POA: Diagnosis not present

## 2016-03-18 DIAGNOSIS — Z1283 Encounter for screening for malignant neoplasm of skin: Secondary | ICD-10-CM | POA: Diagnosis not present

## 2016-03-18 DIAGNOSIS — L304 Erythema intertrigo: Secondary | ICD-10-CM | POA: Diagnosis not present

## 2016-05-11 DIAGNOSIS — H04123 Dry eye syndrome of bilateral lacrimal glands: Secondary | ICD-10-CM | POA: Diagnosis not present

## 2016-07-02 DIAGNOSIS — E038 Other specified hypothyroidism: Secondary | ICD-10-CM | POA: Diagnosis not present

## 2016-07-02 DIAGNOSIS — J309 Allergic rhinitis, unspecified: Secondary | ICD-10-CM | POA: Diagnosis not present

## 2016-07-02 DIAGNOSIS — Z23 Encounter for immunization: Secondary | ICD-10-CM | POA: Diagnosis not present

## 2016-07-02 DIAGNOSIS — E7800 Pure hypercholesterolemia, unspecified: Secondary | ICD-10-CM | POA: Diagnosis not present

## 2016-07-28 ENCOUNTER — Encounter: Payer: Self-pay | Admitting: Cardiology

## 2016-07-31 ENCOUNTER — Ambulatory Visit (INDEPENDENT_AMBULATORY_CARE_PROVIDER_SITE_OTHER): Payer: BLUE CROSS/BLUE SHIELD | Admitting: Cardiology

## 2016-07-31 ENCOUNTER — Encounter: Payer: Self-pay | Admitting: Cardiology

## 2016-07-31 VITALS — BP 128/86 | HR 63 | Ht 65.5 in | Wt 249.1 lb

## 2016-07-31 DIAGNOSIS — R079 Chest pain, unspecified: Secondary | ICD-10-CM

## 2016-07-31 NOTE — Patient Instructions (Addendum)
Medication Instructions:   Your physician recommends that you continue on your current medications as directed. Please refer to the Current Medication list given to you today.   If you need a refill on your cardiac medications before your next appointment, please call your pharmacy.  Labwork: NONE ORDER TODAY    Testing/Procedures: Your physician has requested that you have a lexiscan myoview. For further information please visit [email or web address]. Please follow instruction sheet, as given.    Follow-Up: WILL BE BASED UPON STRESS TEST RESULTS    Any Other Special Instructions Will Be Listed Below (If Applicable).

## 2016-07-31 NOTE — Progress Notes (Signed)
07/31/2016 [name]   1956/10/05  [id number]  Primary Physician Haywood Pao, MD Primary Cardiologist: Dr. Radford Pax   Reason for Visit/CC:  Chest Pain  HPI:  [name] is a 60 y.o. female with a history of SVT. She is followed by Dr. Radford Pax. She has no other cardiac history. She had an echocardiogram on 02/26/16 that showed normal LVEF at 60-65%. Wall motion was normal w/o abnormalities. No significant valvular abnormalities. Her last NST was 06/2010 for atypical CP. This was negative for ischemia. Her SVT has been controlled with Cardizem. In addition to her arrhthymia history, she has a h/o DLD, breast CA s/p lumpectomy, and GERD.  She is a former smoker and quit 8 years ago. Her father had an MI but late in age, in his 46s.   She presents to clinic today with complaint of chest pain that occurred last week while sleeping. Prior to development of CP, she had 2 days of viral like symptoms with fatigue, malaise, body aches with neck stiffness, HA, sore/ ichthy throat. Symptoms ultimately resolved spontaneously after 2-3 days. Her chest discomfort occurred around this time period. She was asleep and woke up with substernal chest tightness. She denies palpitations. No associated dyspnea. It was non radiating. No PND or orthopnea. She took antacids and xanax and dozed back off to sleep, pretty quickly. When she awoke, she was w/o symptoms. She denies any further recurrence. She feels great today. She is able to walk her dog w/o exertional CP but sometimes she gets short of breath with exertion. She thinks some of this may be due to her weight.   EKG today shows NSR. No ischemia. BP is well controlled at 128/86.     Current Meds  Medication Sig  . acetaminophen (TYLENOL) 500 MG tablet Take 500 mg by mouth every 6 (six) hours as needed for pain.  Marland Kitchen ALPRAZolam (XANAX) 0.5 MG tablet Take 0.5 mg by mouth 3 (three) times daily as needed for anxiety.   Marland Kitchen aspirin 81 MG tablet  Take 81 mg by mouth daily.   . Calcium Carbonate-Vitamin D (CALCIUM 600 + D PO) Take 1 tablet by mouth 2 (two) times daily. 600mg   . diltiazem (CARTIA XT) 120 MG 24 hr capsule TAKE ONE CAPSULE BY MOUTH IN THE MORNING.  . Flaxseed, Linseed, (FLAXSEED OIL PO) Take 1 tablet by mouth every evening.   . halobetasol (ULTRAVATE) 0.05 % cream as needed.   . hydrOXYzine (VISTARIL) 25 MG capsule Take 1 capsule by mouth daily as needed. itching  . levothyroxine (SYNTHROID, LEVOTHROID) 137 MCG tablet Take 274 mcg by mouth daily before breakfast.  . omeprazole (PRILOSEC) 40 MG capsule Take 40 mg by mouth daily.  . pantoprazole (PROTONIX) 40 MG tablet Take 40 mg by mouth 2 (two) times daily.   . sertraline (ZOLOFT) 100 MG tablet Take 100 mg by mouth daily.  . sodium chloride (OCEAN) 0.65 % SOLN nasal spray Place 1 spray into the nose at bedtime.  . traMADol (ULTRAM) 50 MG tablet Take 50 mg by mouth every 6 (six) hours as needed for pain. Maximum dose= 8 tablets per day.   Allergies  Allergen Reactions  . Sulfa Drugs Cross Reactors Hives   Past Medical History:  Diagnosis Date  . Anxiety   . Arthritis    lower back and thumbs  . Cancer [facility])    left breast-(12-12-09 radiation and surgery)-Dr. Jana Hakim -oncology  . Colon polyps    sm aden p 10/06 (  difficult exam, needs propofol);neg 07/2010  . Dyslipidemia   . Edema extremities 02/07/2016  . GERD (gastroesophageal reflux disease)    Barrett's [facility]), neg for metaplasia '97, '08 (mod HH) 9.2011  . Laryngitis 12-20-12   recent episode is resolving with Amoxicillin orally  . Obesity   . Osteoporosis   . Osteoporosis   . PONV (postoperative nausea and vomiting)   . SVT (supraventricular tachycardia) ([place])   . Thyroid adenoma 12-20-12   surgery planned 12-30-12   Family History  Problem Relation Age of Onset  . Stroke Mother   . Heart disease Father   . Cancer Brother     melanoma   Past Surgical History:  Procedure Laterality Date  . BREAST  SURGERY     left breast lumpectomy  . EYE SURGERY  12-20-12   right eye retina tear repair  . THYROIDECTOMY N/A 12/30/2012   Procedure: THYROIDECTOMY;  Surgeon: Earnstine Regal, MD;  Location: [facility];  Service: General;  Laterality: N/A;   Social History   Social History  . Marital status: Married    Spouse name: N/A  . Number of children: N/A  . Years of education: N/A   Occupational History  . Not on file.   Social History Main Topics  . Smoking status: Former Smoker    Types: Cigarettes    Quit date: 01/31/2010  . Smokeless tobacco: Never Used  . Alcohol use Yes     Comment: social- occ  . Drug use: No  . Sexual activity: Yes    Birth control/ protection: Post-menopausal   Other Topics Concern  . Not on file   Social History Narrative  . No narrative on file     Review of Systems: General: negative for chills, fever, night sweats or weight changes.  Cardiovascular: negative for chest pain, dyspnea on exertion, edema, orthopnea, palpitations, paroxysmal nocturnal dyspnea or shortness of breath Dermatological: negative for rash Respiratory: negative for cough or wheezing Urologic: negative for hematuria Abdominal: negative for nausea, vomiting, diarrhea, bright red blood per rectum, melena, or hematemesis Neurologic: negative for visual changes, syncope, or dizziness All other systems reviewed and are otherwise negative except as noted above.   Physical Exam:  Blood pressure 128/86, pulse 63, height 5' 5.5" (1.664 m), weight 249 lb 1.6 oz (113 kg), SpO2 97 %.  General appearance: alert, cooperative and no distress, obese Neck: no carotid bruit and no JVD Lungs: clear to auscultation bilaterally Heart: regular rate and rhythm, S1, S2 normal, no murmur, click, rub or gallop Extremities: chronic LLE with compression stocking. venous insuffiency Pulses: 2+ and symmetric Skin: warm and dry Neurologic: Grossly normal  EKG NSR. No ischemia  ASSESSMENT AND PLAN:   1.  Chest Pain at Rest: mixed typical + atypical features, although mostly atypical in nature. No recurrence since episode last week. Currently CP free. EKG w/o ischemia. She has multiple risk factors. Her last NST was 6 years ago. We will repeat stress test to r/o ischemia.   2. ? Viral Syndrome: pt's complaints of sore throat, fatigue, malaise, body aches/ neck stiffness x 2-3 days, likely viral etiology. Symptoms have resolved. Pt back to baseline. No fevers.   3. DLD: followed by PCP. Pt states PCP recently checked cholesterol and total was ~230 mg/dL. She is taking fish oil + diet modification. Plan is to repeat levels in several weeks. Pt states PCP informed her that she may need to start a statin. We discussed this and I agree that  statin therapy should be initiated if not under better control to reduce the risk of CV events.   4. Obesity: pt acknowledged recent weight gain and need to increase physical activity for weight loss.   PLAN  NST to r/o ischemia. If abnormal, will have pt schedule f/u. Otherwise keep f/u with Dr. Radford Pax 01/2017 for routine yearly f/u.   Lyda Jester PA-C 07/31/2016 8:52 AM

## 2016-08-03 ENCOUNTER — Telehealth (HOSPITAL_COMMUNITY): Payer: Self-pay | Admitting: *Deleted

## 2016-08-03 NOTE — Telephone Encounter (Signed)
Patient given detailed instructions per Myocardial Perfusion Study Information Sheet for the test on  08/06/16. Patient notified to arrive 15 minutes early and that it is imperative to arrive on time for appointment to keep from having the test rescheduled.  If you need to cancel or reschedule your appointment, please call the office within 24 hours of your appointment. Failure to do so may result in a cancellation of your appointment, and a $50 no show fee. Patient verbalized understanding. [name]

## 2016-08-06 ENCOUNTER — Ambulatory Visit (HOSPITAL_COMMUNITY): Payer: BLUE CROSS/BLUE SHIELD | Attending: Cardiology

## 2016-08-06 DIAGNOSIS — R079 Chest pain, unspecified: Secondary | ICD-10-CM

## 2016-08-06 MED ORDER — REGADENOSON 0.4 MG/5ML IV SOLN
0.4000 mg | Freq: Once | INTRAVENOUS | Status: AC
  Administered 2016-08-06: 0.4 mg via INTRAVENOUS

## 2016-08-06 MED ORDER — TECHNETIUM TC 99M TETROFOSMIN IV KIT
33.0000 | PACK | Freq: Once | INTRAVENOUS | Status: AC | PRN
  Administered 2016-08-06: 33 via INTRAVENOUS
  Filled 2016-08-06: qty 33

## 2016-08-07 ENCOUNTER — Ambulatory Visit (HOSPITAL_COMMUNITY): Payer: BLUE CROSS/BLUE SHIELD | Attending: Internal Medicine

## 2016-08-07 LAB — MYOCARDIAL PERFUSION IMAGING
CHL CUP NUCLEAR SRS: 1
CHL CUP NUCLEAR SSS: 1
LV dias vol: 122 mL (ref 46–106)
LVSYSVOL: 40 mL
Peak HR: 97 {beats}/min
RATE: 0.32
Rest HR: 65 {beats}/min
SDS: 0
TID: 0.95

## 2016-08-07 MED ORDER — TECHNETIUM TC 99M TETROFOSMIN IV KIT
32.7000 | PACK | Freq: Once | INTRAVENOUS | Status: AC | PRN
Start: 2016-08-07 — End: 2016-08-07
  Administered 2016-08-07: 32.7 via INTRAVENOUS
  Filled 2016-08-07: qty 33

## 2016-08-10 ENCOUNTER — Telehealth: Payer: Self-pay | Admitting: Cardiology

## 2016-08-10 NOTE — Telephone Encounter (Signed)
-----   Message from [place], [place] sent at 08/10/2016 12:09 PM EDT ----- Low risk stress test. No signs of blood flow abnormalities. Pump function is normal.

## 2016-08-10 NOTE — Telephone Encounter (Signed)
Pt aware of her stress test and she verbalized understanding.

## 2016-08-10 NOTE — Telephone Encounter (Signed)
New message ° ° ° ° ° °Returning a call to the nurse to get stress test results °

## 2016-08-11 ENCOUNTER — Telehealth: Payer: Self-pay | Admitting: Cardiology

## 2016-08-11 NOTE — Telephone Encounter (Signed)
New Message  Pt voiced she had appt with PA/APP Lyda Jester on 07/31/16 and we forgot to provide pt prescription for compression socks.  Pt voiced she prefers knee highs  Please f/u with pt

## 2016-08-11 NOTE — Telephone Encounter (Signed)
Left message for patient that [name] is not here today, but order would be clarified and she will be called tomorrow with an update.

## 2016-08-12 NOTE — Telephone Encounter (Signed)
Returned call to pt to let her know that we had her rx for her compression stockings ready and to see where pt would like it to be faxed. Per pt request, rx faxed to [facility] fax# [phone]  Pt verbalized appreciation.

## 2016-08-24 ENCOUNTER — Telehealth: Payer: Self-pay | Admitting: *Deleted

## 2016-08-24 NOTE — Telephone Encounter (Signed)
checking to see if RX for compression stockings was sent, she went to the medical supply store and they said they didnt have the RX.

## 2016-08-25 ENCOUNTER — Telehealth: Payer: Self-pay | Admitting: Cardiology

## 2016-08-25 NOTE — Telephone Encounter (Signed)
Returned pts call.  Left her a detailed message that the rx for her Compression Stockings were sent to [facility] on 08/12/16.  Fax confirmation is under Media if pt needs it re faxed.

## 2016-08-25 NOTE — Telephone Encounter (Signed)
New message   Pt verbalized that she wants rn to resend the RX for the compressions to [facility] supply today

## 2016-08-25 NOTE — Telephone Encounter (Signed)
Returned pts call.  She has been advised that rx was sent 08/12/16 to [facility].  Pt advises that they lost it.  I advised pt that I would print it off with the fax confirmation and resend it.  I will also contact [facility] to make sure they receive it.  Pt verbalized appreciation for all my help.

## 2016-09-14 DIAGNOSIS — R109 Unspecified abdominal pain: Secondary | ICD-10-CM | POA: Diagnosis not present

## 2016-09-14 DIAGNOSIS — Z6841 Body Mass Index (BMI) 40.0 and over, adult: Secondary | ICD-10-CM | POA: Diagnosis not present

## 2016-09-14 DIAGNOSIS — Z0000 Encounter for general adult medical examination without abnormal findings: Secondary | ICD-10-CM | POA: Diagnosis not present

## 2016-09-18 ENCOUNTER — Other Ambulatory Visit: Payer: Self-pay | Admitting: Internal Medicine

## 2016-09-18 DIAGNOSIS — R109 Unspecified abdominal pain: Secondary | ICD-10-CM

## 2017-01-05 DIAGNOSIS — Z0000 Encounter for general adult medical examination without abnormal findings: Secondary | ICD-10-CM | POA: Diagnosis not present

## 2017-01-05 DIAGNOSIS — E038 Other specified hypothyroidism: Secondary | ICD-10-CM | POA: Diagnosis not present

## 2017-01-05 DIAGNOSIS — M810 Age-related osteoporosis without current pathological fracture: Secondary | ICD-10-CM | POA: Diagnosis not present

## 2017-01-11 ENCOUNTER — Other Ambulatory Visit: Payer: Self-pay | Admitting: Oncology

## 2017-01-11 ENCOUNTER — Other Ambulatory Visit: Payer: Self-pay | Admitting: Internal Medicine

## 2017-01-11 DIAGNOSIS — Z853 Personal history of malignant neoplasm of breast: Secondary | ICD-10-CM

## 2017-01-12 DIAGNOSIS — E038 Other specified hypothyroidism: Secondary | ICD-10-CM | POA: Diagnosis not present

## 2017-01-12 DIAGNOSIS — E7800 Pure hypercholesterolemia, unspecified: Secondary | ICD-10-CM | POA: Diagnosis not present

## 2017-01-12 DIAGNOSIS — J309 Allergic rhinitis, unspecified: Secondary | ICD-10-CM | POA: Diagnosis not present

## 2017-01-12 DIAGNOSIS — Z0000 Encounter for general adult medical examination without abnormal findings: Secondary | ICD-10-CM | POA: Diagnosis not present

## 2017-01-21 DIAGNOSIS — Z1212 Encounter for screening for malignant neoplasm of rectum: Secondary | ICD-10-CM | POA: Diagnosis not present

## 2017-01-26 ENCOUNTER — Ambulatory Visit
Admission: RE | Admit: 2017-01-26 | Discharge: 2017-01-26 | Disposition: A | Discharge status: Home / Self Care | Payer: BLUE CROSS/BLUE SHIELD | Source: Ambulatory Visit | Attending: Internal Medicine | Admitting: Internal Medicine

## 2017-01-26 DIAGNOSIS — R928 Other abnormal and inconclusive findings on diagnostic imaging of breast: Secondary | ICD-10-CM | POA: Diagnosis not present

## 2017-01-26 DIAGNOSIS — Z853 Personal history of malignant neoplasm of breast: Secondary | ICD-10-CM

## 2017-02-06 ENCOUNTER — Other Ambulatory Visit: Payer: Self-pay | Admitting: Cardiology

## 2017-05-03 DIAGNOSIS — K219 Gastro-esophageal reflux disease without esophagitis: Secondary | ICD-10-CM | POA: Diagnosis not present

## 2017-05-03 DIAGNOSIS — R1030 Lower abdominal pain, unspecified: Secondary | ICD-10-CM | POA: Diagnosis not present

## 2017-05-03 DIAGNOSIS — Z6841 Body Mass Index (BMI) 40.0 and over, adult: Secondary | ICD-10-CM | POA: Diagnosis not present

## 2017-05-17 ENCOUNTER — Other Ambulatory Visit: Payer: Self-pay | Admitting: Internal Medicine

## 2017-05-17 DIAGNOSIS — R1031 Right lower quadrant pain: Secondary | ICD-10-CM

## 2017-05-19 ENCOUNTER — Inpatient Hospital Stay
Admission: RE | Admit: 2017-05-19 | Discharge: 2017-05-19 | Disposition: A | Discharge status: Home / Self Care | Payer: BLUE CROSS/BLUE SHIELD | Source: Ambulatory Visit | Attending: Internal Medicine | Admitting: Internal Medicine

## 2017-05-24 DIAGNOSIS — Z1211 Encounter for screening for malignant neoplasm of colon: Secondary | ICD-10-CM | POA: Diagnosis not present

## 2017-05-24 DIAGNOSIS — R1031 Right lower quadrant pain: Secondary | ICD-10-CM | POA: Diagnosis not present

## 2017-05-24 DIAGNOSIS — K5900 Constipation, unspecified: Secondary | ICD-10-CM | POA: Diagnosis not present

## 2017-06-09 DIAGNOSIS — L400 Psoriasis vulgaris: Secondary | ICD-10-CM | POA: Diagnosis not present

## 2017-06-09 DIAGNOSIS — Z1283 Encounter for screening for malignant neoplasm of skin: Secondary | ICD-10-CM | POA: Diagnosis not present

## 2017-07-15 DIAGNOSIS — Z23 Encounter for immunization: Secondary | ICD-10-CM | POA: Diagnosis not present

## 2017-07-15 DIAGNOSIS — R1030 Lower abdominal pain, unspecified: Secondary | ICD-10-CM | POA: Diagnosis not present

## 2017-07-15 DIAGNOSIS — Z6839 Body mass index (BMI) 39.0-39.9, adult: Secondary | ICD-10-CM | POA: Diagnosis not present

## 2017-07-16 ENCOUNTER — Other Ambulatory Visit: Payer: Self-pay | Admitting: Internal Medicine

## 2017-07-16 DIAGNOSIS — R1011 Right upper quadrant pain: Secondary | ICD-10-CM

## 2017-07-23 ENCOUNTER — Ambulatory Visit
Admission: RE | Admit: 2017-07-23 | Discharge: 2017-07-23 | Disposition: A | Discharge status: Home / Self Care | Payer: BLUE CROSS/BLUE SHIELD | Source: Ambulatory Visit | Attending: Internal Medicine | Admitting: Internal Medicine

## 2017-07-23 DIAGNOSIS — R1011 Right upper quadrant pain: Secondary | ICD-10-CM

## 2017-08-24 DIAGNOSIS — Z1211 Encounter for screening for malignant neoplasm of colon: Secondary | ICD-10-CM | POA: Diagnosis not present

## 2017-09-09 NOTE — Progress Notes (Signed)
Cardiology Office Note    Date:  09/10/2017  ID:  [name], DOB 1956-04-23, MRN [id number] PCP:  Haywood Pao, MD  Cardiologist:  Dr. Radford Pax   Chief Complaint: yearly f/u, dyspnea, buzzing chest discomfort  History of Present Illness:  [name] is a 61 y.o. female with history of PSVT, thyroid adenoma, remote breast CA, former tobacco abuse, dyslipidemia, GERD, obesity, arthritis, Barrett's esophagus who presents for yearly f/u. She had an echocardiogram on 02/26/16 that showed normal LVEF at 60-65%, normal wall motion, mild LAE. Remote stress test in 2011 was negative for ischemia, along with updated study in 08/2016 also unremarkable with EF >65%. Saw PCP earlier this fall for RUQ pain with unremarkable [place]. Last labs were in 2016, K 4.0, Cr 0.9, CBC wnl; remote TSH 2015 was elevated.  She presents for yearly follow-up, also to discuss a few symptoms: - Buzzing sensation in breast. Over the last few weeks she has felt occasional electric shock type sensation in her left breast, lasting a second, not worse with exertion, inspiration or palpation. Not associated with any change in rhythm (auscultated in NSR while patient identified one of these sensations). - She had a Green[place] fracture in her left upper arm several years ago and occasionally notices left upper arm ache during damp weather. Also not exertional. Felt this last week - She says her husband has noticed she's more SOB while walking the dogs but she was not particularly concerned with this. She has not been exercising regularly. No orthopnea or weight gain. - Blood pressure elevated in clinic today, with recheck in the 149/85 by me. She has not been following this at home. Reports compliance with meds. Does not watch sodium in diet. + chronic LLE edema compared to right following ortho issue. - PSVT is well controlled on cardizem   Past Medical History:  Diagnosis Date  . Anxiety   . Arthritis    lower back and thumbs  . Breast cancer ([place])   . Cancer [facility])    left breast-(12-12-09 radiation and surgery)-Dr. Jana Hakim -oncology  . Colon polyps    sm aden p 10/06 (difficult exam, needs propofol);neg 07/2010  . Dyslipidemia   . Edema extremities 02/07/2016  . GERD (gastroesophageal reflux disease)    [facility]'s [facility]), neg for metaplasia '97, '08 (mod HH) 9.2011  . Laryngitis 12-20-12   recent episode is resolving with Amoxicillin orally  . Obesity   . Osteoporosis   . Osteoporosis   . PONV (postoperative nausea and vomiting)   . SVT (supraventricular tachycardia) ([place])   . Thyroid adenoma 12-20-12   surgery planned 12-30-12    Past Surgical History:  Procedure Laterality Date  . BREAST LUMPECTOMY Left   . BREAST SURGERY     left breast lumpectomy  . EYE SURGERY  12-20-12   right eye retina tear repair    Current Medications: Current Meds  Medication Sig  . ALPRAZolam (XANAX) 0.5 MG tablet Take 0.5 mg by mouth 3 (three) times daily as needed for anxiety.   Marland Kitchen aspirin 81 MG tablet Take 81 mg by mouth daily.   . Calcium Carbonate-Vitamin D (CALCIUM 600 + D PO) Take 1 tablet by mouth 2 (two) times daily. 600mg   . CARTIA XT 120 MG 24 hr capsule TAKE ONE CAPSULE BY MOUTH IN THE MORNING  . halobetasol (ULTRAVATE) 0.05 % cream as needed.   . hydrOXYzine (VISTARIL) 25 MG capsule Take 1 capsule by mouth daily as needed. itching  .  levothyroxine (SYNTHROID, LEVOTHROID) 137 MCG tablet Take 274 mcg by mouth daily before breakfast.  . loratadine (CLARITIN) 10 MG tablet Take 10 mg daily by mouth.  . Omega-3 Fatty Acids (FISH OIL) 1200 MG CAPS Take 1 capsule daily by mouth.  Marland Kitchen omeprazole (PRILOSEC) 40 MG capsule Take 40 mg by mouth daily.  . pantoprazole (PROTONIX) 40 MG tablet Take 40 mg by mouth 2 (two) times daily.   . sertraline (ZOLOFT) 100 MG tablet Take 100 mg by mouth daily.  . sodium chloride (OCEAN) 0.65 % SOLN nasal spray Place 1 spray into the nose at bedtime.     Allergies:    Sulfa drugs cross reactors   Social History   Socioeconomic History  . Marital status: Married    Spouse name: None  . Number of children: None  . Years of education: None  . Highest education level: None  Social Needs  . Financial resource strain: None  . Food insecurity - worry: None  . Food insecurity - inability: None  . Transportation needs - medical: None  . Transportation needs - non-medical: None  Occupational History  . None  Tobacco Use  . Smoking status: Former Smoker    Types: Cigarettes    Last attempt to quit: 01/31/2010    Years since quitting: 7.6  . Smokeless tobacco: Never Used  Substance and Sexual Activity  . Alcohol use: Yes    Comment: social- occ  . Drug use: No  . Sexual activity: Yes    Birth control/protection: Post-menopausal  Other Topics Concern  . None  Social History Narrative  . None     Family History:  Family History  Problem Relation Age of Onset  . Stroke Mother   . Heart disease Father   . Cancer Brother        melanoma    ROS:   Please see the history of present illness.  All other systems are reviewed and otherwise negative.    PHYSICAL EXAM:   VS:  BP 140/82   Pulse 88   Ht 5\' 6"  (1.676 m)   Wt 231 lb 12.8 oz (105.1 kg)   SpO2 98%   BMI 37.41 kg/m   BMI: Body mass index is 37.41 kg/m. GEN: Well nourished, well developed obese WF, in no acute distress  HEENT: normocephalic, atraumatic Neck: no JVD, carotid bruits, or masses Cardiac: RRR, soft SEM without focal localization, no rubs or gallops, L>R LE edema which pt states is chronic Respiratory:  clear to auscultation bilaterally, normal work of breathing GI: soft, nontender, nondistended, + BS MS: no deformity or atrophy  Skin: warm and dry, no rash Neuro:  Alert and Oriented x 3, Strength and sensation are intact, follows commands Psych: euthymic mood, full affect  Wt Readings from Last 3 Encounters:  09/10/17 231 lb 12.8 oz (105.1 kg)  08/06/16 249 lb  (112.9 kg)  07/31/16 249 lb 1.6 oz (113 kg)      Studies/Labs Reviewed:   EKG:  EKG was ordered today and personally reviewed by me and demonstrates NSR 69bpm, cannot r/o prior anterior infarct, low voltage precordial leads, nonspecific inferior TW changes. Aside from voltage, tracing appears similar to prior.  Recent Labs: No results found for requested labs within last 8760 hours.   Additional studies/ records that were reviewed today include: Summarized above.    ASSESSMENT & PLAN:   1. Dyspnea on exertion/atypical chest pain - buzzing in chest is highly atypical and do not suspect cardiac  in nature. Given prior h/o breast CA I am not sure what this represents but I asked her to please touch base with her PCP regarding possible neuropathic pain and discuss if further workup is necessary. Her LUE discomfort has also been chronic. She does not seem particularly bothered by the dyspnea but reports her husband has noticed it's worse lately. EKG shows decreased voltage in anterior leads. Exam notable for questionable very mild systolic murmur. I will obtain updated echocardiogram. We briefly discussed cardiac CT if symptoms persist without obvious findings on echocardiogram, but will await echo for now. Also need to follow blood pressure given elevated readings in office as this could be contributing to her dyspnea. Will update CBC, BMET and BNP. 2. Elevated blood pressure in office without diagnosis of HTN - she reports historical BP control in the 120/70s but has not been following it recently. Repeat BP by me was 148/85 so I do suspect she will need additional therapy for this but given isolated reading today, she would prefer to follow BP daily over the next week and call in with readings. I told her in the meantime if she is tending to see readings of >130/80 at which time it might be worthwhile to consider initiation of diuretic if renal function OK. 3. PSVT - quiescent. 4. Dyslipidemia -  she reports this is followed by PCP. 5. Former tobacco abuse - remains abstinent.   Disposition: F/u APP in 6 weeks.   Medication Adjustments/Labs and Tests Ordered: Current medicines are reviewed at length with the patient today.  Concerns regarding medicines are outlined above. Medication changes, Labs and Tests ordered today are summarized above and listed in the Patient Instructions accessible in Encounters.   Signed, Charlie Pitter, PA-C  09/10/2017 2:33 PM    [address], [place], [place]  [id number] Phone: [phone]; Fax: [phone]

## 2017-09-10 ENCOUNTER — Ambulatory Visit: Payer: BLUE CROSS/BLUE SHIELD | Admitting: Physician Assistant

## 2017-09-10 ENCOUNTER — Encounter: Payer: Self-pay | Admitting: *Deleted

## 2017-09-10 ENCOUNTER — Encounter: Payer: Self-pay | Admitting: Physician Assistant

## 2017-09-10 DIAGNOSIS — R0609 Other forms of dyspnea: Secondary | ICD-10-CM

## 2017-09-10 DIAGNOSIS — Z87891 Personal history of nicotine dependence: Secondary | ICD-10-CM

## 2017-09-10 DIAGNOSIS — R030 Elevated blood-pressure reading, without diagnosis of hypertension: Secondary | ICD-10-CM

## 2017-09-10 DIAGNOSIS — R0789 Other chest pain: Secondary | ICD-10-CM

## 2017-09-10 DIAGNOSIS — I471 Supraventricular tachycardia: Secondary | ICD-10-CM

## 2017-09-10 DIAGNOSIS — R0600 Dyspnea, unspecified: Secondary | ICD-10-CM

## 2017-09-10 DIAGNOSIS — E785 Hyperlipidemia, unspecified: Secondary | ICD-10-CM | POA: Diagnosis not present

## 2017-09-10 MED ORDER — PANTOPRAZOLE SODIUM 40 MG PO TBEC: 40.0000 mg | DELAYED_RELEASE_TABLET | Freq: Every day | ORAL | 3 refills | Status: AC

## 2017-09-10 NOTE — Patient Instructions (Addendum)
Medication Instructions:  Your physician recommends that you continue on your current medications as directed. Please refer to the Current Medication list given to you today.   Labwork: TODAY:  BMET, CBC, & PRO BNP  Testing/Procedures: Your physician has requested that you have an echocardiogram. Echocardiography is a painless test that uses sound waves to create images of your heart. It provides your doctor with information about the size and shape of your heart and how well your heart's chambers and valves are working. This procedure takes approximately one hour. There are no restrictions for this procedure.    Follow-Up: Your physician recommends that you schedule a follow-up appointment in: [place], PA-C OR BRITTANY SIMMONS, PA-C   Any Other Special Instructions Will Be Listed Below (If Applicable).  Echocardiogram An echocardiogram, or echocardiography, uses sound waves (ultrasound) to produce an image of your heart. The echocardiogram is simple, painless, obtained within a short period of time, and offers valuable information to your health care provider. The images from an echocardiogram can provide information such as:  Evidence of coronary artery disease (CAD).  Heart size.  Heart muscle function.  Heart valve function.  Aneurysm detection.  Evidence of a past heart attack.  Fluid buildup around the heart.  Heart muscle thickening.  Assess heart valve function.  Tell a health care provider about:  Any allergies you have.  All medicines you are taking, including vitamins, herbs, eye drops, creams, and over-the-counter medicines.  Any problems you or family members have had with anesthetic medicines.  Any blood disorders you have.  Any surgeries you have had.  Any medical conditions you have.  Whether you are pregnant or may be pregnant. What happens before the procedure? No special preparation is needed. Eat and drink normally. What  happens during the procedure?  In order to produce an image of your heart, gel will be applied to your chest and a wand-like tool (transducer) will be moved over your chest. The gel will help transmit the sound waves from the transducer. The sound waves will harmlessly bounce off your heart to allow the heart images to be captured in real-time motion. These images will then be recorded.  You may need an IV to receive a medicine that improves the quality of the pictures. What happens after the procedure? You may return to your normal schedule including diet, activities, and medicines, unless your health care provider tells you otherwise. This information is not intended to replace advice given to you by your health care provider. Make sure you discuss any questions you have with your health care provider. Document Released: 10/16/2000 Document Revised: 06/06/2016 Document Reviewed: 06/26/2013 Elsevier Interactive Patient Education  2017 [employer].    If you need a refill on your cardiac medications before your next appointment, please call your pharmacy.

## 2017-09-11 LAB — BASIC METABOLIC PANEL
BUN / CREAT RATIO: 12 (ref 12–28)
BUN: 11 mg/dL (ref 8–27)
CO2: 24 mmol/L (ref 20–29)
CREATININE: 0.9 mg/dL (ref 0.57–1.00)
Calcium: 9.8 mg/dL (ref 8.7–10.3)
Chloride: 103 mmol/L (ref 96–106)
GFR, EST AFRICAN AMERICAN: 79 mL/min/{1.73_m2} (ref >59)
GFR, EST NON AFRICAN AMERICAN: 68 mL/min/{1.73_m2} (ref >59)
Glucose: 100 mg/dL — ABNORMAL HIGH (ref 65–99)
POTASSIUM: 4.2 mmol/L (ref 3.5–5.2)
SODIUM: 142 mmol/L (ref 134–144)

## 2017-09-11 LAB — PRO B NATRIURETIC PEPTIDE: NT-Pro BNP: 484 pg/mL — ABNORMAL HIGH (ref 0–287)

## 2017-09-11 LAB — CBC
HEMATOCRIT: 36.1 % (ref 34.0–46.6)
HEMOGLOBIN: 12 g/dL (ref 11.1–15.9)
MCH: 28.6 pg (ref 26.6–33.0)
MCHC: 33.2 g/dL (ref 31.5–35.7)
MCV: 86 fL (ref 79–97)
Platelets: 316 10*3/uL (ref 150–379)
RBC: 4.2 x10E6/uL (ref 3.77–5.28)
RDW: 15.1 % (ref 12.3–15.4)
WBC: 6.2 10*3/uL (ref 3.4–10.8)

## 2017-09-16 ENCOUNTER — Ambulatory Visit (HOSPITAL_COMMUNITY): Payer: BLUE CROSS/BLUE SHIELD | Attending: Cardiology

## 2017-09-16 ENCOUNTER — Other Ambulatory Visit: Payer: Self-pay

## 2017-09-16 DIAGNOSIS — Z6837 Body mass index (BMI) 37.0-37.9, adult: Secondary | ICD-10-CM | POA: Diagnosis not present

## 2017-09-16 DIAGNOSIS — R0789 Other chest pain: Secondary | ICD-10-CM | POA: Insufficient documentation

## 2017-09-16 DIAGNOSIS — Z853 Personal history of malignant neoplasm of breast: Secondary | ICD-10-CM | POA: Insufficient documentation

## 2017-09-16 DIAGNOSIS — I10 Essential (primary) hypertension: Secondary | ICD-10-CM | POA: Insufficient documentation

## 2017-09-16 DIAGNOSIS — I471 Supraventricular tachycardia: Secondary | ICD-10-CM | POA: Diagnosis not present

## 2017-09-16 DIAGNOSIS — Z8249 Family history of ischemic heart disease and other diseases of the circulatory system: Secondary | ICD-10-CM | POA: Diagnosis not present

## 2017-09-16 DIAGNOSIS — Z923 Personal history of irradiation: Secondary | ICD-10-CM | POA: Insufficient documentation

## 2017-09-16 DIAGNOSIS — R0600 Dyspnea, unspecified: Secondary | ICD-10-CM

## 2017-09-16 DIAGNOSIS — E669 Obesity, unspecified: Secondary | ICD-10-CM | POA: Diagnosis not present

## 2017-09-16 DIAGNOSIS — E785 Hyperlipidemia, unspecified: Secondary | ICD-10-CM | POA: Insufficient documentation

## 2017-09-16 DIAGNOSIS — R0609 Other forms of dyspnea: Secondary | ICD-10-CM | POA: Insufficient documentation

## 2017-09-17 ENCOUNTER — Encounter: Payer: Self-pay | Admitting: Physician Assistant

## 2017-09-17 ENCOUNTER — Telehealth: Payer: Self-pay | Admitting: Physician Assistant

## 2017-09-17 DIAGNOSIS — I471 Supraventricular tachycardia: Secondary | ICD-10-CM

## 2017-09-17 MED ORDER — FUROSEMIDE 20 MG PO TABS: 20.0000 mg | ORAL_TABLET | Freq: Every day | ORAL | 3 refills | Status: DC

## 2017-09-17 NOTE — Telephone Encounter (Signed)
LM TO CALL BACK ./CY 

## 2017-09-17 NOTE — Telephone Encounter (Signed)
Mrs. [name] is returning a call .Marland Kitchen Thanks

## 2017-09-17 NOTE — Telephone Encounter (Signed)
PT AWARE OF LAB AND ECHO RESULTS .Adonis Housekeeper

## 2017-09-20 ENCOUNTER — Telehealth: Payer: Self-pay | Admitting: Physician Assistant

## 2017-09-20 MED ORDER — PRAVASTATIN SODIUM 20 MG PO TABS
20.0000 mg | ORAL_TABLET | Freq: Every evening | ORAL | 3 refills | Status: DC
Start: 2017-09-20 — End: 2017-09-20

## 2017-09-20 NOTE — Addendum Note (Signed)
Addended by: Gaetano Net on: 09/20/2017 09:51 AM   Modules accepted: Orders

## 2017-09-20 NOTE — Telephone Encounter (Signed)
Called nurse to clarify rx for pravastatin that came through to my box to cosign; nurse added to list as patient had not recalled she was on it at recent [place]. We do not manage this prescription so I added med list as patient-reported med. This is followed by PCP. Did ask nurse to please obtain last lipids/LFTs from PCP for our records going forward.

## 2017-09-27 ENCOUNTER — Other Ambulatory Visit: Payer: BLUE CROSS/BLUE SHIELD | Admitting: *Deleted

## 2017-09-27 ENCOUNTER — Telehealth: Payer: Self-pay | Admitting: Cardiology

## 2017-09-27 DIAGNOSIS — I471 Supraventricular tachycardia: Secondary | ICD-10-CM

## 2017-09-27 DIAGNOSIS — Z6839 Body mass index (BMI) 39.0-39.9, adult: Secondary | ICD-10-CM | POA: Diagnosis not present

## 2017-09-27 DIAGNOSIS — C50912 Malignant neoplasm of unspecified site of left female breast: Secondary | ICD-10-CM | POA: Diagnosis not present

## 2017-09-27 DIAGNOSIS — F418 Other specified anxiety disorders: Secondary | ICD-10-CM | POA: Diagnosis not present

## 2017-09-27 NOTE — Telephone Encounter (Signed)
Patient calling, requested an appt to be seen today for heart flutters

## 2017-09-27 NOTE — Telephone Encounter (Signed)
Called patient back about her message. Patient complaining of buzzing feeling or fluttering in her chest. Patient stated it starts when she wakes up in the morning and keeps going all day long. Patient was instructed to follow up with her PCP at her last visit with Melina Copa PA. See office visit note 09/10/17. Patient stated she has not called her PCP. Reviewed patient's chart with Mare Loan PA, and she recommends patient to see her PCP, that this does not appear to be cardiac related. Patient verbalized understanding and will call her PCP.

## 2017-09-28 LAB — BASIC METABOLIC PANEL
BUN / CREAT RATIO: 15 (ref 12–28)
BUN: 13 mg/dL (ref 8–27)
CALCIUM: 9.6 mg/dL (ref 8.7–10.3)
CHLORIDE: 104 mmol/L (ref 96–106)
CO2: 26 mmol/L (ref 20–29)
Creatinine, Ser: 0.88 mg/dL (ref 0.57–1.00)
GFR calc Af Amer: 81 mL/min/{1.73_m2} (ref >59)
GFR calc non Af Amer: 70 mL/min/{1.73_m2} (ref >59)
GLUCOSE: 120 mg/dL — AB (ref 65–99)
Potassium: 4 mmol/L (ref 3.5–5.2)
Sodium: 144 mmol/L (ref 134–144)

## 2017-09-30 ENCOUNTER — Other Ambulatory Visit: Payer: Self-pay | Admitting: Internal Medicine

## 2017-09-30 DIAGNOSIS — Z1231 Encounter for screening mammogram for malignant neoplasm of breast: Secondary | ICD-10-CM

## 2017-10-06 ENCOUNTER — Other Ambulatory Visit: Payer: Self-pay | Admitting: Internal Medicine

## 2017-10-06 DIAGNOSIS — N644 Mastodynia: Secondary | ICD-10-CM

## 2017-10-06 DIAGNOSIS — Z853 Personal history of malignant neoplasm of breast: Secondary | ICD-10-CM

## 2017-10-14 ENCOUNTER — Ambulatory Visit
Admission: RE | Admit: 2017-10-14 | Discharge: 2017-10-14 | Disposition: A | Discharge status: Home / Self Care | Payer: BLUE CROSS/BLUE SHIELD | Source: Ambulatory Visit | Attending: Internal Medicine | Admitting: Internal Medicine

## 2017-10-14 ENCOUNTER — Ambulatory Visit: Admission: RE | Admit: 2017-10-14 | Payer: BLUE CROSS/BLUE SHIELD | Source: Ambulatory Visit

## 2017-10-14 DIAGNOSIS — Z853 Personal history of malignant neoplasm of breast: Secondary | ICD-10-CM

## 2017-10-14 DIAGNOSIS — N644 Mastodynia: Secondary | ICD-10-CM

## 2017-10-14 DIAGNOSIS — R928 Other abnormal and inconclusive findings on diagnostic imaging of breast: Secondary | ICD-10-CM | POA: Diagnosis not present

## 2017-10-14 HISTORY — DX: Personal history of irradiation: Z92.3

## 2017-10-18 ENCOUNTER — Encounter: Payer: Self-pay | Admitting: Physician Assistant

## 2017-10-18 DIAGNOSIS — R030 Elevated blood-pressure reading, without diagnosis of hypertension: Secondary | ICD-10-CM | POA: Insufficient documentation

## 2017-10-18 DIAGNOSIS — I77810 Thoracic aortic ectasia: Secondary | ICD-10-CM | POA: Insufficient documentation

## 2017-10-18 NOTE — Progress Notes (Signed)
Cardiology Office Note    Date:  10/19/2017  ID:  [name], DOB 08-May-1956, MRN [id number] PCP:  Haywood Pao, MD  Cardiologist:  Dr. Radford Pax   Chief Complaint: f/u dyspnea  History of Present Illness:  [name] is a 61 y.o. female with history of PSVT, mildly dilated ascending aorta, thyroid adenoma, remote breast CA, former tobacco abuse, dyslipidemia (followed by PCP), GERD, recent mild HTN, obesity, arthritis, Barrett's esophagus who presents for f/u dyspnea.  To recap prior cardiac history, she had an echocardiogram on 02/26/16 that showed normal LVEF at 60-65%, normal wall motion, mild LAE. Remote stress test in 2011 was negative for ischemia, along with updated study in 08/2016 also unremarkable with EF >65%. Saw PCP earlier this fall for RUQ pain with unremarkable [place]. She was seen in the office 09/2017 with a few symptoms including buzzing sensation in breast, aching in left arm during damp weather since having Greenstick fracture, more exertional dyspnea when walking the dog. Her blood pressure was elevated in clinic. The buzzing sensation/fluttering in her breast was not associated with exertion or any ectopy on auscultation. 2D Echo 09/16/17 showed EF 60-65%, grade 2 DD, mildly dilated ascending aorta 81mm. Her labwork did reveal elevated BNP 484, otherwise normal CBC/BMET. She was started on Lasix 20mg  daily. F/u BMET 09/27/17 showed K 4.0, Cr 0.88.   She returns for follow-up today feeling great. Her dyspnea has resolved. She's not had any chest pain. 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 Since our last visit she has made several commitments for improved health in the new year - regular exercise and improving diet. Her short-term goal is to avoid gaining any more weight this holiday season. She did cut out a lot of salt and was surprised to find out how much salt there is in  everything. She brings in a log of BPs which appear to range from 025 - 427 systolic.  Past Medical History:  Diagnosis Date  . Anxiety   . Arthritis    lower back and thumbs  . Breast cancer ([place])   . Cancer [facility])    left breast-(12-12-09 radiation and surgery)-Dr. Jana Hakim -oncology  . Chronic diastolic CHF (congestive heart failure) ([place])   . Colon polyps    sm aden p 10/06 (difficult exam, needs propofol);neg 07/2010  . Dyslipidemia   . Edema extremities 02/07/2016  . Elevated blood pressure reading without diagnosis of hypertension   . GERD (gastroesophageal reflux disease)    Barrett's [facility]), neg for metaplasia '97, '08 (mod HH) 9.2011  . Laryngitis 12-20-12   recent episode is resolving with Amoxicillin orally  . Mild dilation of ascending aorta (HCC)   . Obesity   . Osteoporosis   . Osteoporosis   . Personal history of radiation therapy    2011  . PONV (postoperative nausea and vomiting)   . SVT (supraventricular tachycardia) ([place])   . Thyroid adenoma 12-20-12   surgery planned 12-30-12    Past Surgical History:  Procedure Laterality Date  . BREAST LUMPECTOMY Left    2011  . BREAST SURGERY     left breast lumpectomy  . EYE SURGERY  12-20-12   right eye retina tear repair  . THYROIDECTOMY N/A 12/30/2012   Procedure: THYROIDECTOMY;  Surgeon: Earnstine Regal, MD;  Location: [facility];  Service: General;  Laterality: N/A;    Current  Medications: Current Meds  Medication Sig  . ALPRAZolam (XANAX) 0.5 MG tablet Take 0.5 mg by mouth 3 (three) times daily as needed for anxiety.   Marland Kitchen aspirin 81 MG tablet Take 81 mg by mouth daily.   . Calcium Carbonate-Vitamin D (CALCIUM 600 + D PO) Take 1 tablet by mouth 2 (two) times daily. 600mg   . CARTIA XT 120 MG 24 hr capsule TAKE ONE CAPSULE BY MOUTH IN THE MORNING  . furosemide (LASIX) 20 MG tablet Take 1 tablet (20 mg total) daily by mouth.  . halobetasol (ULTRAVATE) 0.05 % cream as needed.   . hydrOXYzine (VISTARIL) 25 MG capsule  Take 1 capsule by mouth daily as needed. itching  . levothyroxine (SYNTHROID, LEVOTHROID) 137 MCG tablet Take 274 mcg by mouth daily before breakfast.  . loratadine (CLARITIN) 10 MG tablet Take 10 mg daily by mouth.  . Omega-3 Fatty Acids (FISH OIL) 1200 MG CAPS Take 1 capsule daily by mouth.  Marland Kitchen omeprazole (PRILOSEC) 40 MG capsule Take 40 mg by mouth daily.  . pantoprazole (PROTONIX) 40 MG tablet Take 1 tablet (40 mg total) daily by mouth.  . pravastatin (PRAVACHOL) 20 MG tablet Take 20 mg every evening by mouth.  . sertraline (ZOLOFT) 100 MG tablet Take 150 mg by mouth daily.  . sodium chloride (OCEAN) 0.65 % SOLN nasal spray Place 1 spray into the nose at bedtime.     Allergies:   Sulfa antibiotics; Sulfa drugs cross reactors; and Venlafaxine   Social History   Socioeconomic History  . Marital status: Married    Spouse name: None  . Number of children: None  . Years of education: None  . Highest education level: None  Social Needs  . Financial resource strain: None  . Food insecurity - worry: None  . Food insecurity - inability: None  . Transportation needs - medical: None  . Transportation needs - non-medical: None  Occupational History  . None  Tobacco Use  . Smoking status: Former Smoker    Types: Cigarettes    Last attempt to quit: 01/31/2010    Years since quitting: 7.7  . Smokeless tobacco: Never Used  Substance and Sexual Activity  . Alcohol use: Yes    Comment: social- occ  . Drug use: No  . Sexual activity: Yes    Birth control/protection: Post-menopausal  Other Topics Concern  . None  Social History Narrative  . None     Family History:  Family History  Problem Relation Age of Onset  . Stroke Mother   . Heart disease Father   . Cancer Brother        melanoma    ROS:   Please see the history of present illness. All other systems are reviewed and otherwise negative.    PHYSICAL EXAM:   VS:  BP 134/70   Pulse 80   Ht 5\' 6"  (1.676 m)   Wt 239 lb  12.8 oz (108.8 kg)   SpO2 97%   BMI 38.70 kg/m   BMI: Body mass index is 38.7 kg/m. GEN: Well nourished, well developed obese WF, in no acute distress  HEENT: normocephalic, atraumatic Neck: no JVD, carotid bruits, or masses Cardiac: RRR; no murmurs, rubs, or gallops, no edema  (compression hose in place) Respiratory:  clear to auscultation bilaterally, normal work of breathing GI: soft, nontender, nondistended, + BS MS: no deformity or atrophy  Skin: warm and dry, no rash, bilateral varicose veins Neuro:  Alert and Oriented x 3, Strength and  sensation are intact, follows commands Psych: euthymic mood, full affect  Wt Readings from Last 3 Encounters:  10/19/17 239 lb 12.8 oz (108.8 kg)  09/10/17 231 lb 12.8 oz (105.1 kg)  08/06/16 249 lb (112.9 kg)      Studies/Labs Reviewed:   EKG:  EKG was not ordered today.  Recent Labs: 09/10/2017: Hemoglobin 12.0; NT-Pro BNP 484; Platelets 316 09/27/2017: BUN 13; Creatinine, Ser 0.88; Potassium 4.0; Sodium 144   Lipid Panel No results found for: CHOL, TRIG, HDL, CHOLHDL, VLDL, LDLCALC, LDLDIRECT  Additional studies/ records that were reviewed today include: Summarized above.    ASSESSMENT & PLAN:   1. Dyspnea on exertion/possible chronic diastolic CHF - recent BNP mildly elevated and echo showing grade 2 DD. Dyspnea resolved with low dose Lasix. Continue. Patient has been advised of 2g sodium, 2L fluid restriction. Weight has been variable recently with the holiday season but she reports she's getting in a mindset of improved lifestyle in the weeks ahead. 2. Atypical chest buzzing - resolved, previously advised to f/u PCP, not associated with any ectopy. 3. Elevated blood pressure without diagnosis of HTN - suspect this represents mild essential HTN (particularly given newer guidelines which advocate for stricter control). I suspect her BP will improve with weight reduction as well. Continue present regimen. No significant fatigue  noted. Given plan for weight loss, will hold off on consideration of sleep study but this could be considered at subsequent f/u. Advised her to call if SBP routinely begins running >381 systolic. 4. PSVT - quiescent. 5. Mild dilation of ascending aorta - reviewed with patient. Mainstay of treatment for now will be good BP control. Will need f/u around 09/2018. She will be following up with Dr. Radford Pax in 2019 at which time this can be ordered.  Disposition: F/u with Dr. Radford Pax in 6 months.   Medication Adjustments/Labs and Tests Ordered: Current medicines are reviewed at length with the patient today.  Concerns regarding medicines are outlined above. Medication changes, Labs and Tests ordered today are summarized above and listed in the Patient Instructions accessible in Encounters.   Signed, Charlie Pitter, PA-C  10/19/2017 9:03 AM    [facility] [place], [place], [place]  [address] Phone: [phone]; Fax: [phone]

## 2017-10-19 ENCOUNTER — Encounter: Payer: Self-pay | Admitting: Physician Assistant

## 2017-10-19 ENCOUNTER — Ambulatory Visit: Payer: BLUE CROSS/BLUE SHIELD | Admitting: Physician Assistant

## 2017-10-19 VITALS — BP 134/70 | HR 80 | Ht 66.0 in | Wt 239.8 lb

## 2017-10-19 DIAGNOSIS — I471 Supraventricular tachycardia: Secondary | ICD-10-CM | POA: Diagnosis not present

## 2017-10-19 DIAGNOSIS — R0609 Other forms of dyspnea: Secondary | ICD-10-CM | POA: Diagnosis not present

## 2017-10-19 DIAGNOSIS — R030 Elevated blood-pressure reading, without diagnosis of hypertension: Secondary | ICD-10-CM

## 2017-10-19 DIAGNOSIS — R0600 Dyspnea, unspecified: Secondary | ICD-10-CM

## 2017-10-19 DIAGNOSIS — R0789 Other chest pain: Secondary | ICD-10-CM | POA: Diagnosis not present

## 2017-10-19 DIAGNOSIS — I77810 Thoracic aortic ectasia: Secondary | ICD-10-CM

## 2017-10-19 NOTE — Patient Instructions (Signed)
Medication Instructions:  Your physician recommends that you continue on your current medications as directed. Please refer to the Current Medication list given to you today.   Labwork: None ordered  Testing/Procedures: None ordered  Follow-Up: Your physician wants you to follow-up in: [place] DR. Mallie Snooks will receive a reminder letter in the mail two months in advance. If you don't receive a letter, please call our office to schedule the follow-up appointment.   Any Other Special Instructions Will Be Listed Below (If Applicable).  PLEASE CALL OUR OFFICE IF YOUR BLOOD PRESSURE RUNS OVER 130 REGULARLY    If you need a refill on your cardiac medications before your next appointment, please call your pharmacy.

## 2017-11-26 DIAGNOSIS — R1031 Right lower quadrant pain: Secondary | ICD-10-CM | POA: Diagnosis not present

## 2017-11-26 DIAGNOSIS — Z86010 Personal history of colonic polyps: Secondary | ICD-10-CM | POA: Diagnosis not present

## 2017-11-26 DIAGNOSIS — K219 Gastro-esophageal reflux disease without esophagitis: Secondary | ICD-10-CM | POA: Diagnosis not present

## 2017-12-29 ENCOUNTER — Other Ambulatory Visit: Payer: Self-pay | Admitting: Internal Medicine

## 2017-12-29 DIAGNOSIS — Z1231 Encounter for screening mammogram for malignant neoplasm of breast: Secondary | ICD-10-CM

## 2018-01-07 DIAGNOSIS — M810 Age-related osteoporosis without current pathological fracture: Secondary | ICD-10-CM | POA: Diagnosis not present

## 2018-01-07 DIAGNOSIS — E038 Other specified hypothyroidism: Secondary | ICD-10-CM | POA: Diagnosis not present

## 2018-01-07 DIAGNOSIS — Z0000 Encounter for general adult medical examination without abnormal findings: Secondary | ICD-10-CM | POA: Diagnosis not present

## 2018-01-14 DIAGNOSIS — I77810 Thoracic aortic ectasia: Secondary | ICD-10-CM | POA: Diagnosis not present

## 2018-01-14 DIAGNOSIS — E7800 Pure hypercholesterolemia, unspecified: Secondary | ICD-10-CM | POA: Diagnosis not present

## 2018-01-14 DIAGNOSIS — I519 Heart disease, unspecified: Secondary | ICD-10-CM | POA: Diagnosis not present

## 2018-01-14 DIAGNOSIS — Z0000 Encounter for general adult medical examination without abnormal findings: Secondary | ICD-10-CM | POA: Diagnosis not present

## 2018-01-14 DIAGNOSIS — Z1389 Encounter for screening for other disorder: Secondary | ICD-10-CM | POA: Diagnosis not present

## 2018-01-14 DIAGNOSIS — Z6841 Body Mass Index (BMI) 40.0 and over, adult: Secondary | ICD-10-CM | POA: Diagnosis not present

## 2018-01-14 DIAGNOSIS — J3089 Other allergic rhinitis: Secondary | ICD-10-CM | POA: Diagnosis not present

## 2018-01-28 DIAGNOSIS — Z1212 Encounter for screening for malignant neoplasm of rectum: Secondary | ICD-10-CM | POA: Diagnosis not present

## 2018-02-06 ENCOUNTER — Other Ambulatory Visit: Payer: Self-pay | Admitting: Cardiology

## 2018-02-08 ENCOUNTER — Ambulatory Visit: Payer: BLUE CROSS/BLUE SHIELD

## 2018-03-11 ENCOUNTER — Ambulatory Visit: Payer: BLUE CROSS/BLUE SHIELD

## 2018-03-23 DIAGNOSIS — Z01419 Encounter for gynecological examination (general) (routine) without abnormal findings: Secondary | ICD-10-CM | POA: Diagnosis not present

## 2018-03-23 DIAGNOSIS — Z124 Encounter for screening for malignant neoplasm of cervix: Secondary | ICD-10-CM | POA: Diagnosis not present

## 2018-03-23 DIAGNOSIS — I509 Heart failure, unspecified: Secondary | ICD-10-CM | POA: Insufficient documentation

## 2018-03-23 DIAGNOSIS — Z6841 Body Mass Index (BMI) 40.0 and over, adult: Secondary | ICD-10-CM | POA: Diagnosis not present

## 2018-04-05 ENCOUNTER — Ambulatory Visit
Admission: RE | Admit: 2018-04-05 | Discharge: 2018-04-05 | Disposition: A | Discharge status: Home / Self Care | Payer: BLUE CROSS/BLUE SHIELD | Source: Ambulatory Visit | Attending: Internal Medicine | Admitting: Internal Medicine

## 2018-04-05 DIAGNOSIS — Z1231 Encounter for screening mammogram for malignant neoplasm of breast: Secondary | ICD-10-CM | POA: Diagnosis not present

## 2018-04-06 ENCOUNTER — Ambulatory Visit (INDEPENDENT_AMBULATORY_CARE_PROVIDER_SITE_OTHER): Payer: BLUE CROSS/BLUE SHIELD

## 2018-04-06 ENCOUNTER — Encounter (INDEPENDENT_AMBULATORY_CARE_PROVIDER_SITE_OTHER): Payer: Self-pay | Admitting: Orthopedic Surgery

## 2018-04-06 ENCOUNTER — Ambulatory Visit (INDEPENDENT_AMBULATORY_CARE_PROVIDER_SITE_OTHER): Payer: BLUE CROSS/BLUE SHIELD | Admitting: Orthopedic Surgery

## 2018-04-06 ENCOUNTER — Ambulatory Visit (INDEPENDENT_AMBULATORY_CARE_PROVIDER_SITE_OTHER): Payer: Self-pay

## 2018-04-06 DIAGNOSIS — M545 Low back pain, unspecified: Secondary | ICD-10-CM

## 2018-04-06 DIAGNOSIS — M25532 Pain in left wrist: Secondary | ICD-10-CM

## 2018-04-06 DIAGNOSIS — G8929 Other chronic pain: Secondary | ICD-10-CM | POA: Diagnosis not present

## 2018-04-06 MED ORDER — DICLOFENAC SODIUM 2 % TD SOLN: TRANSDERMAL | 1 refills | Status: DC

## 2018-04-10 ENCOUNTER — Encounter (INDEPENDENT_AMBULATORY_CARE_PROVIDER_SITE_OTHER): Payer: Self-pay | Admitting: Orthopedic Surgery

## 2018-04-10 NOTE — Progress Notes (Signed)
Office Visit Note   Patient: [name]           Date of Birth: 03-Apr-1956           MRN: [id number] Visit Date: 04/06/2018 Requested by: Haywood Pao, MD [address] [place], [place] [address] PCP: Osborne Casco Fransico Him, MD  Subjective: Chief Complaint  Patient presents with  . Left Wrist - Pain  . Lower Back - Pain    HPI: [name] is a patient with left forearm pain and left low back pain.  She reports pain in the left thumb radiating into the forearm.  She is a cancer survivor and is worried about the problem manifesting elsewhere.  She had a lumpectomy with radiation in 2011.  Patient also reports low back pain radiating into the left buttock.  Is been intermittent for a year.  Mainly starts at times.  She states she just has episodic pain and it is tolerable.  She has a history of CMC injections years ago more than the left thumb than the right.  The pain in the back does not wake her from sleep at night.  He cannot take nonsteroidals.              ROS: All systems reviewed are negative as they relate to the chief complaint within the history of present illness.  Patient denies  fevers or chills.   Assessment & Plan: Visit Diagnoses:  1. Chronic left-sided low back pain without sciatica   2. Left wrist pain     Plan: Impression is CMC arthritis which is symptomatic.  No real evidence of de Quervain's tenosynovitis.  Plan is topical anti-inflammatory therapy to that left thumb region.  We could consider repeat CMC injections if the topical does not work.  In regards to the back pain the radiographs are normal and her symptoms are not to bad at this time.  No real radicular symptoms.  This is something we can watch.  No red flag symptoms to warrant further radiologic evaluation at this time.  Follow-Up Instructions: Return if symptoms worsen or fail to improve.   Orders:  Orders Placed This Encounter  Procedures  . XR Wrist 2 Views Left  . XR Lumbar Spine 2-3  Views   Meds ordered this encounter  Medications  . Diclofenac Sodium (PENNSAID) 2 % SOLN    Sig: 2 squirts to affected area bid prn pain    Dispense:  1 Bottle    Refill:  1      Procedures: No procedures performed   Clinical Data: No additional findings.  Objective: Vital Signs: There were no vitals taken for this visit.  Physical Exam:   Constitutional: Patient appears well-developed HEENT:  Head: Normocephalic Eyes:EOM are normal Neck: Normal range of motion Cardiovascular: Normal rate Pulmonary/chest: Effort normal Neurologic: Patient is alert Skin: Skin is warm Psychiatric: Patient has normal mood and affect    Ortho Exam: Orthopedic exam of that left forearm demonstrates good wrist range of motion with full pronation supination.  Radial pulses intact.  No masses lymphadenopathy or skin changes noted in that wrist region.  She does have positive grind test but no tenderness over the first dorsal compartment.  Grip strength is intact.  Sensation intact on the dorsal and palmar aspect of the hand.  Specialty Comments:  No specialty comments available.  Imaging: No results found.   PMFS History: Patient Active Problem List   Diagnosis Date Noted  . Mild dilation of ascending  aorta ([place])   . Elevated blood pressure reading without diagnosis of hypertension   . Abnormal EKG 02/07/2016  . Edema extremities 02/07/2016  . Joint pain 03/19/2014  . Paroxysmal supraventricular tachycardia ([place]) 12/22/2013  . Breast cancer of upper-outer quadrant of left female breast ([place]) 09/19/2013  . Vaginal atrophy 08/18/2013  . Vaginitis and vulvovaginitis 08/18/2013  . Pain, pelvic, female 08/18/2013  . Hypothyroidism, postsurgical 04/03/2013   Past Medical History:  Diagnosis Date  . Anxiety   . Arthritis    lower back and thumbs  . Breast cancer ([place])   . Breast cancer of upper-outer quadrant of left female breast ([place]) 09/19/2013  . Cancer [facility])    left  breast-(12-12-09 radiation and surgery)-Dr. Jana Hakim -oncology  . Chronic diastolic CHF (congestive heart failure) ([place])   . Colon polyps    sm aden p 10/06 (difficult exam, needs propofol);neg 07/2010  . Dyslipidemia   . Edema extremities 02/07/2016  . Elevated blood pressure reading without diagnosis of hypertension   . GERD (gastroesophageal reflux disease)    Barrett's 95 [facility]), neg for metaplasia '97, '08 (mod HH) 9.2011  . HTN (hypertension)   . Hypothyroidism, postsurgical 04/03/2013  . Laryngitis 12-20-12   recent episode is resolving with Amoxicillin orally  . Mild dilation of ascending aorta (HCC)   . Obesity   . Osteoporosis   . Osteoporosis   . Paroxysmal supraventricular tachycardia ([place]) 12/22/2013  . Personal history of radiation therapy    2011  . PONV (postoperative nausea and vomiting)   . SVT (supraventricular tachycardia) ([place])   . Thyroid adenoma 12-20-12   surgery planned 12-30-12  . Vaginal atrophy 08/18/2013  . Vaginitis and vulvovaginitis 08/18/2013    Family History  Problem Relation Age of Onset  . Stroke Mother   . Heart disease Father   . Cancer Brother        melanoma    Past Surgical History:  Procedure Laterality Date  . BREAST LUMPECTOMY Left    2011  . BREAST SURGERY     left breast lumpectomy  . EYE SURGERY  12-20-12   right eye retina tear repair  . THYROIDECTOMY N/A 12/30/2012   Procedure: THYROIDECTOMY;  Surgeon: Earnstine Regal, MD;  Location: [facility];  Service: General;  Laterality: N/A;   Social History   Occupational History  . Not on file  Tobacco Use  . Smoking status: Former Smoker    Types: Cigarettes    Last attempt to quit: 01/31/2010    Years since quitting: 8.1  . Smokeless tobacco: Never Used  Substance and Sexual Activity  . Alcohol use: Yes    Comment: social- occ  . Drug use: No  . Sexual activity: Yes    Birth control/protection: Post-menopausal

## 2018-04-18 ENCOUNTER — Ambulatory Visit: Payer: BLUE CROSS/BLUE SHIELD | Admitting: Cardiology

## 2018-04-18 ENCOUNTER — Encounter: Payer: Self-pay | Admitting: Cardiology

## 2018-04-18 VITALS — BP 151/82 | HR 66 | Ht 66.0 in | Wt 243.4 lb

## 2018-04-18 DIAGNOSIS — I5032 Chronic diastolic (congestive) heart failure: Secondary | ICD-10-CM | POA: Insufficient documentation

## 2018-04-18 DIAGNOSIS — I77810 Thoracic aortic ectasia: Secondary | ICD-10-CM

## 2018-04-18 DIAGNOSIS — I471 Supraventricular tachycardia: Secondary | ICD-10-CM | POA: Diagnosis not present

## 2018-04-18 DIAGNOSIS — I10 Essential (primary) hypertension: Secondary | ICD-10-CM | POA: Insufficient documentation

## 2018-04-18 MED ORDER — DILTIAZEM HCL ER COATED BEADS 120 MG PO CP24: ORAL_CAPSULE | ORAL | 3 refills | Status: DC

## 2018-04-18 NOTE — Progress Notes (Addendum)
Cardiology Office Note:    Date:  04/18/2018   ID:  [name], DOB 21-Nov-1955, MRN [id number]  PCP:  Haywood Pao, MD  Cardiologist:  Fransico Him, MD    Referring MD: Haywood Pao, MD   Chief Complaint  Patient presents with  . Follow-up    SVT, CHF, dilated aorta    History of Present Illness:    [name] is a 62 y.o. female with a hx of PSVT, mildly dilated ascending aorta, thyroid adenoma, remote breast CA, former tobacco abuse, dyslipidemia (followed by PCP), GERD, recent mild HTN, obesity, arthritis, Barrett's esophagus who presents for f/u dyspnea.  Her last echo on 09/16/2017 showed normal LV function with EF 60 to 65% with grade 2 diastolic dysfunction, mildly dilated a sending aorta at 38 mm.  She was last seen in the office in November by my extender complaining of shortness of breath and was noted to have an elevated BNP at 484 and she was started on Lasix.  She was seen back in December and her shortness of breath had resolved.  She has been trying to follow a 2 g sodium 2 L fluid restricted diet.  She is here today for followup and is doing well.  She denies any chest pain or pressure, SOB, DOE (except walking up an incline), PND, orthopnea,dizziness, palpitations or syncope.  She denies lower extremity edema as long she wears compression hose.  She is compliant with her meds and is tolerating meds with no SE.    Past Medical History:  Diagnosis Date  . Anxiety   . Arthritis    lower back and thumbs  . Breast cancer of upper-outer quadrant of left female breast ([place]) 09/19/2013  . Cancer [facility])    left breast-(12-12-09 radiation and surgery)-Dr. Jana Hakim -oncology  . Chronic diastolic CHF (congestive heart failure) ([place])   . Colon polyps    sm aden p 10/06 (difficult exam, needs propofol);neg 07/2010  . Dyslipidemia   . Edema extremities 02/07/2016  . GERD (gastroesophageal reflux disease)    Barrett's 95 [facility]), neg for metaplasia '97,  '08 (mod HH) 9.2011  . HTN (hypertension)   . Hypothyroidism, postsurgical 04/03/2013  . Laryngitis 12-20-12   recent episode is resolving with Amoxicillin orally  . Mild dilation of ascending aorta (HCC)   . Obesity   . Osteoporosis   . Paroxysmal supraventricular tachycardia ([place]) 12/22/2013  . Personal history of radiation therapy    2011  . PONV (postoperative nausea and vomiting)   . Thyroid adenoma 12-20-12   surgery planned 12-30-12  . Vaginal atrophy 08/18/2013  . Vaginitis and vulvovaginitis 08/18/2013    Past Surgical History:  Procedure Laterality Date  . BREAST LUMPECTOMY Left    2011  . BREAST SURGERY     left breast lumpectomy  . EYE SURGERY  12-20-12   right eye retina tear repair  . THYROIDECTOMY N/A 12/30/2012   Procedure: THYROIDECTOMY;  Surgeon: Earnstine Regal, MD;  Location: [facility];  Service: General;  Laterality: N/A;    Current Medications: Current Meds  Medication Sig  . ALPRAZolam (XANAX) 0.5 MG tablet Take 0.5 mg by mouth 3 (three) times daily as needed for anxiety.   Marland Kitchen aspirin EC 81 MG tablet Take 81 mg by mouth every other day.  . Diclofenac Sodium (PENNSAID) 2 % SOLN 2 squirts to affected area bid prn pain  . diltiazem (CARTIA XT) 120 MG 24 hr capsule TAKE 1 CAPSULE BY MOUTH IN  THE MORNING  . furosemide (LASIX) 20 MG tablet Take 1 tablet (20 mg total) daily by mouth.  . halobetasol (ULTRAVATE) 0.05 % cream as needed.   . hydrOXYzine (VISTARIL) 25 MG capsule Take 1 capsule by mouth daily as needed. itching  . levothyroxine (SYNTHROID, LEVOTHROID) 137 MCG tablet Take 274 mcg by mouth daily before breakfast.  . Omega-3 Fatty Acids (FISH OIL) 1200 MG CAPS Take 1 capsule daily by mouth.  Marland Kitchen omeprazole (PRILOSEC) 40 MG capsule Take 40 mg by mouth daily.  . pantoprazole (PROTONIX) 40 MG tablet Take 1 tablet (40 mg total) daily by mouth.  . pravastatin (PRAVACHOL) 20 MG tablet Take 20 mg every evening by mouth.  . sertraline (ZOLOFT) 100 MG tablet Take 150 mg by  mouth daily.  . sodium chloride (OCEAN) 0.65 % SOLN nasal spray Place 1 spray into the nose at bedtime.  . [DISCONTINUED] CARTIA XT 120 MG 24 hr capsule TAKE 1 CAPSULE BY MOUTH IN THE MORNING     Allergies:   Sulfa antibiotics; Sulfa drugs cross reactors; and Venlafaxine   Social History   Socioeconomic History  . Marital status: Married    Spouse name: Not on file  . Number of children: Not on file  . Years of education: Not on file  . Highest education level: Not on file  Occupational History  . Not on file  Social Needs  . Financial resource strain: Not on file  . Food insecurity:    Worry: Not on file    Inability: Not on file  . Transportation needs:    Medical: Not on file    Non-medical: Not on file  Tobacco Use  . Smoking status: Former Smoker    Types: Cigarettes    Last attempt to quit: 01/31/2010    Years since quitting: 8.2  . Smokeless tobacco: Never Used  Substance and Sexual Activity  . Alcohol use: Yes    Comment: social- occ  . Drug use: No  . Sexual activity: Yes    Birth control/protection: Post-menopausal  Lifestyle  . Physical activity:    Days per week: Not on file    Minutes per session: Not on file  . Stress: Not on file  Relationships  . Social connections:    Talks on phone: Not on file    Gets together: Not on file    Attends religious service: Not on file    Active member of club or organization: Not on file    Attends meetings of clubs or organizations: Not on file    Relationship status: Not on file  Other Topics Concern  . Not on file  Social History Narrative  . Not on file     Family History: The patient's family history includes Cancer in her brother; Heart disease in her father; Stroke in her mother.  ROS:   Please see the history of present illness.    ROS  All other systems reviewed and negative.   EKGs/Labs/Other Studies Reviewed:    The following studies were reviewed today: none  EKG:  EKG is  ordered today and  showed sinus rhythm at 66 bpm with no ST changes.  Recent Labs: 09/10/2017: Hemoglobin 12.0; NT-Pro BNP 484; Platelets 316 09/27/2017: BUN 13; Creatinine, Ser 0.88; Potassium 4.0; Sodium 144   Recent Lipid Panel No results found for: CHOL, TRIG, HDL, CHOLHDL, VLDL, LDLCALC, LDLDIRECT  Physical Exam:    VS:  BP (!) 151/82   Pulse 66   Ht 5'  6" (1.676 m)   Wt 243 lb 6.4 oz (110.4 kg)   BMI 39.29 kg/m     Wt Readings from Last 3 Encounters:  04/18/18 243 lb 6.4 oz (110.4 kg)  10/19/17 239 lb 12.8 oz (108.8 kg)  09/10/17 231 lb 12.8 oz (105.1 kg)     GEN:  Well nourished, well developed in no acute distress HEENT: Normal NECK: No JVD; No carotid bruits LYMPHATICS: No lymphadenopathy CARDIAC: RRR, no murmurs, rubs, gallops RESPIRATORY:  Clear to auscultation without rales, wheezing or rhonchi  ABDOMEN: Soft, non-tender, non-distended MUSCULOSKELETAL:  No edema; No deformity  SKIN: Warm and dry NEUROLOGIC:  Alert and oriented x 3 PSYCHIATRIC:  Normal affect   ASSESSMENT:    1. Paroxysmal supraventricular tachycardia ([place])   2. Mild dilation of ascending aorta (HCC)   3. Chronic diastolic heart failure ([place])   4. Benign essential HTN    PLAN:    In order of problems listed above:  1.  PSVT -she has not had any further palpitations.  She will continue on Cartia XT 120 mg daily.  2.  Mildly dilated ascending aortic aneurysm -measured 38 mm by echo November 2018.  Continue blood pressure control as well as statin.  Repeat echo 09/2018 for dilated aorta.   3.  Chronic diastolic CHF -she appears euvolemic on exam today.  Her shortness of breath has resolved on Lasix.  The only time she gets shortness of breath is now walking up inclines and I explained to her that this likely due to her obesity.  She is able to walk on her treadmill for 35 minutes at a flat grade without any shortness of breath.  She will continue on Lasix 20 mg daily.  I will get a copy of her last creatinine  from her PCP.  I encouraged her to work on weight loss as this is going to help her shortness of breath, hypertension and heart failure symptoms.  4.  HTN - BP elevated on exam today but she has white coat HTN and BP runs normal at home by reading she brings in today.  Continue Cartia.    Medication Adjustments/Labs and Tests Ordered: Current medicines are reviewed at length with the patient today.  Concerns regarding medicines are outlined above.  Orders Placed This Encounter  Procedures  . EKG 12-Lead   Meds ordered this encounter  Medications  . diltiazem (CARTIA XT) 120 MG 24 hr capsule    Sig: TAKE 1 CAPSULE BY MOUTH IN THE MORNING    Dispense:  90 capsule    Refill:  3    Signed, Fransico Him, MD  04/18/2018 1:31 PM    [facility]

## 2018-04-18 NOTE — Patient Instructions (Signed)
Medication Instructions:  Your physician recommends that you continue on your current medications as directed. Please refer to the Current Medication list given to you today.  If you need a refill on your cardiac medications, please contact your pharmacy first.  Labwork: None ordered   Testing/Procedures: None ordered   Follow-Up: Your physician wants you to follow-up in: 1 year with Dr. Turner. You will receive a reminder letter in the mail two months in advance. If you don't receive a letter, please call our office to schedule the follow-up appointment.  Any Other Special Instructions Will Be Listed Below (If Applicable).   Thank you for choosing [facility]    [name] Delaila Nand, RN  [phone]  If you need a refill on your cardiac medications before your next appointment, please call your pharmacy.   

## 2018-04-20 ENCOUNTER — Telehealth: Payer: Self-pay

## 2018-04-20 NOTE — Telephone Encounter (Signed)
-----   Message from Celina sent at 04/20/2018  3:36 PM EDT ----- Contact: [phone] Hey,  Patient stated she was seen yesterday and was given a paper with Codes on it. States she has lost that paper and needs another one. Could you please call her back.   Thanks, Maudie Mercury

## 2018-04-20 NOTE — Telephone Encounter (Signed)
Left message to call back  

## 2018-04-21 NOTE — Telephone Encounter (Signed)
Spoke with the pt and she is requesting for her AVS with Dr Radford Pax on 6/17, be mailed to her confirmed mailing address. Pt states that she lost it in the parking lot of our office and needs another copy.  Informed the pt that I will print this off and mail this out to her today.  Pt verbalized understanding and agrees with this plan.  Pt gracious for all the assistance provided.

## 2018-04-21 NOTE — Telephone Encounter (Signed)
Follow Up:    Returning [name]'s call from yesterday.

## 2018-04-22 DIAGNOSIS — M546 Pain in thoracic spine: Secondary | ICD-10-CM | POA: Diagnosis not present

## 2018-05-09 ENCOUNTER — Telehealth (INDEPENDENT_AMBULATORY_CARE_PROVIDER_SITE_OTHER): Payer: Self-pay

## 2018-05-09 NOTE — Telephone Encounter (Signed)
Faxed PA back to [facility] for patients pennsaid

## 2018-05-16 DIAGNOSIS — Z1283 Encounter for screening for malignant neoplasm of skin: Secondary | ICD-10-CM | POA: Diagnosis not present

## 2018-05-16 DIAGNOSIS — L400 Psoriasis vulgaris: Secondary | ICD-10-CM | POA: Diagnosis not present

## 2018-06-08 DIAGNOSIS — H04123 Dry eye syndrome of bilateral lacrimal glands: Secondary | ICD-10-CM | POA: Diagnosis not present

## 2018-06-20 DIAGNOSIS — Z23 Encounter for immunization: Secondary | ICD-10-CM | POA: Diagnosis not present

## 2018-07-22 DIAGNOSIS — C50912 Malignant neoplasm of unspecified site of left female breast: Secondary | ICD-10-CM | POA: Diagnosis not present

## 2018-07-22 DIAGNOSIS — Z23 Encounter for immunization: Secondary | ICD-10-CM | POA: Diagnosis not present

## 2018-07-22 DIAGNOSIS — I77810 Thoracic aortic ectasia: Secondary | ICD-10-CM | POA: Diagnosis not present

## 2018-07-22 DIAGNOSIS — E7800 Pure hypercholesterolemia, unspecified: Secondary | ICD-10-CM | POA: Diagnosis not present

## 2018-07-22 DIAGNOSIS — E038 Other specified hypothyroidism: Secondary | ICD-10-CM | POA: Diagnosis not present

## 2018-07-22 DIAGNOSIS — M25572 Pain in left ankle and joints of left foot: Secondary | ICD-10-CM | POA: Diagnosis not present

## 2018-09-11 ENCOUNTER — Other Ambulatory Visit: Payer: Self-pay | Admitting: Physician Assistant

## 2018-09-21 DIAGNOSIS — Z23 Encounter for immunization: Secondary | ICD-10-CM | POA: Diagnosis not present

## 2018-11-07 DIAGNOSIS — M545 Low back pain: Secondary | ICD-10-CM | POA: Diagnosis not present

## 2018-11-15 ENCOUNTER — Encounter (INDEPENDENT_AMBULATORY_CARE_PROVIDER_SITE_OTHER): Payer: Self-pay | Admitting: Family Medicine

## 2018-11-15 ENCOUNTER — Ambulatory Visit (INDEPENDENT_AMBULATORY_CARE_PROVIDER_SITE_OTHER): Payer: BLUE CROSS/BLUE SHIELD | Admitting: Family Medicine

## 2018-11-15 DIAGNOSIS — M5442 Lumbago with sciatica, left side: Secondary | ICD-10-CM

## 2018-11-15 DIAGNOSIS — F419 Anxiety disorder, unspecified: Secondary | ICD-10-CM | POA: Insufficient documentation

## 2018-11-15 DIAGNOSIS — G4700 Insomnia, unspecified: Secondary | ICD-10-CM | POA: Insufficient documentation

## 2018-11-15 DIAGNOSIS — N764 Abscess of vulva: Secondary | ICD-10-CM | POA: Insufficient documentation

## 2018-11-15 MED ORDER — DICLOFENAC SODIUM 2 % TD SOLN: TRANSDERMAL | 6 refills | Status: DC

## 2018-11-15 MED ORDER — TIZANIDINE HCL 2 MG PO TABS: 2.0000 mg | ORAL_TABLET | Freq: Four times a day (QID) | ORAL | 1 refills | Status: DC | PRN

## 2018-11-15 NOTE — Progress Notes (Signed)
Office Visit Note   Patient: [name]           Date of Birth: 1956-03-18           MRN: [id number] Visit Date: 11/15/2018 Requested by: Haywood Pao, MD [address] [place], [place] [address] PCP: Osborne Casco Fransico Him, MD  Subjective: Chief Complaint  Patient presents with  . Lower Back - Pain    DOI 11/04/2018 - Lifted her 60 lb dog off the bed to help her to the floor - pain since then across back with occasional numbness in left foot.    HPI: She is a 63 year old here with low back pain.  On January 3 she lifted her 60 pound dog off the bed and while she was setting it onto the floor she felt something pop in her back.  She had immediate severe pain.  She waited because she thought she had strained the muscles, but after days she was not improving so she went to [facility] urgent care where x-rays were obtained.  She was given Flexeril and a Toradol injection.  She continues to have flareups of pain with occasional radiation into her left leg.  Most of her pain is midline lumbar area.  She has had back problems over the years, usually she gets better faster than this.  She has a history of osteoporosis.  She is on proton pump inhibitors chronically.                ROS: Otherwise noncontributory  Objective: Vital Signs: There were no vitals taken for this visit.  Physical Exam:  Low back: She has some tenderness in the midline from T12-S1.  No pain in the sciatic notch, negative straight leg raise.  Lower extremity strength and reflexes are normal.  Imaging: Recent x-rays lumbar spine were reviewed compared to x-rays from June 2019.  She appears to have a T12 compression fracture which is new since June.  She has L5-S1 degenerative disc disease as well as lower lumbar facet DJD.  Assessment & Plan: 1.  Acute low back pain with probable new T12 compression fracture -Zanaflex as needed, physical therapy referral for cautious exercises.  Anticipate minimum 6  weeks healing time, possibly 3 to 6 months.  If she fails to improve we will order MRI scan.   Follow-Up Instructions: No follow-ups on file.      Procedures: No procedures performed  No notes on file    PMFS History: Patient Active Problem List   Diagnosis Date Noted  . Abscess of vulva 11/15/2018  . Anxiety 11/15/2018  . Insomnia 11/15/2018  . Chronic diastolic heart failure ([place]) 04/18/2018  . Benign essential HTN 04/18/2018  . Congestive heart failure ([place]) 03/23/2018  . Mild dilation of ascending aorta (HCC)   . Elevated blood pressure reading without diagnosis of hypertension   . Abnormal EKG 02/07/2016  . Edema extremities 02/07/2016  . Joint pain 03/19/2014  . Paroxysmal supraventricular tachycardia ([place]) 12/22/2013  . Breast cancer of upper-outer quadrant of left female breast ([place]) 09/19/2013  . Vaginal atrophy 08/18/2013  . Vaginitis and vulvovaginitis 08/18/2013  . Pain, pelvic, female 08/18/2013  . Hypothyroidism, postsurgical 04/03/2013   Past Medical History:  Diagnosis Date  . Anxiety   . Arthritis    lower back and thumbs  . Breast cancer of upper-outer quadrant of left female breast ([place]) 09/19/2013  . Cancer [facility])    left breast-(12-12-09 radiation and surgery)-Dr. Jana Hakim -oncology  . Chronic diastolic  CHF (congestive heart failure) ([place])   . Colon polyps    sm aden p 10/06 (difficult exam, needs propofol);neg 07/2010  . Dyslipidemia   . Edema extremities 02/07/2016  . GERD (gastroesophageal reflux disease)    [facility] [address] [facility]), neg for metaplasia '97, '08 (mod HH) 9.2011  . HTN (hypertension)   . Hypothyroidism, postsurgical 04/03/2013  . Laryngitis 12-20-12   recent episode is resolving with Amoxicillin orally  . Mild dilation of ascending aorta (HCC)   . Obesity   . Osteoporosis   . Paroxysmal supraventricular tachycardia ([place]) 12/22/2013  . Personal history of radiation therapy    2011  . PONV (postoperative nausea and vomiting)   .  Thyroid adenoma 12-20-12   surgery planned 12-30-12  . Vaginal atrophy 08/18/2013  . Vaginitis and vulvovaginitis 08/18/2013    Family History  Problem Relation Age of Onset  . Stroke Mother   . Heart disease Father   . Cancer Brother        melanoma    Past Surgical History:  Procedure Laterality Date  . BREAST LUMPECTOMY Left    2011  . BREAST SURGERY     left breast lumpectomy  . EYE SURGERY  12-20-12   right eye retina tear repair  . THYROIDECTOMY N/A 12/30/2012   Procedure: THYROIDECTOMY;  Surgeon: Earnstine Regal, MD;  Location: [facility];  Service: General;  Laterality: N/A;   Social History   Occupational History  . Not on file  Tobacco Use  . Smoking status: Former Smoker    Types: Cigarettes    Last attempt to quit: 01/31/2010    Years since quitting: 8.7  . Smokeless tobacco: Never Used  Substance and Sexual Activity  . Alcohol use: Yes    Comment: social- occ  . Drug use: No  . Sexual activity: Yes    Birth control/protection: Post-menopausal

## 2018-11-17 ENCOUNTER — Telehealth (INDEPENDENT_AMBULATORY_CARE_PROVIDER_SITE_OTHER): Payer: Self-pay

## 2018-11-17 DIAGNOSIS — S22080D Wedge compression fracture of T11-T12 vertebra, subsequent encounter for fracture with routine healing: Secondary | ICD-10-CM | POA: Diagnosis not present

## 2018-11-17 DIAGNOSIS — M545 Low back pain: Secondary | ICD-10-CM | POA: Diagnosis not present

## 2018-11-17 NOTE — Telephone Encounter (Signed)
Will you please fax these? [phone]

## 2018-11-17 NOTE — Telephone Encounter (Signed)
[facility] called requesting notes, MRI to be faxed to them

## 2018-11-17 NOTE — Telephone Encounter (Signed)
11/15/18 ov note faxed

## 2018-11-21 ENCOUNTER — Ambulatory Visit (INDEPENDENT_AMBULATORY_CARE_PROVIDER_SITE_OTHER): Payer: BLUE CROSS/BLUE SHIELD | Admitting: Orthopedic Surgery

## 2018-11-22 DIAGNOSIS — M545 Low back pain: Secondary | ICD-10-CM | POA: Diagnosis not present

## 2018-11-22 DIAGNOSIS — S22080D Wedge compression fracture of T11-T12 vertebra, subsequent encounter for fracture with routine healing: Secondary | ICD-10-CM | POA: Diagnosis not present

## 2018-11-24 DIAGNOSIS — S22080D Wedge compression fracture of T11-T12 vertebra, subsequent encounter for fracture with routine healing: Secondary | ICD-10-CM | POA: Diagnosis not present

## 2018-11-24 DIAGNOSIS — M545 Low back pain: Secondary | ICD-10-CM | POA: Diagnosis not present

## 2018-11-29 DIAGNOSIS — M545 Low back pain: Secondary | ICD-10-CM | POA: Diagnosis not present

## 2018-11-29 DIAGNOSIS — S22080D Wedge compression fracture of T11-T12 vertebra, subsequent encounter for fracture with routine healing: Secondary | ICD-10-CM | POA: Diagnosis not present

## 2018-12-09 DIAGNOSIS — S22080D Wedge compression fracture of T11-T12 vertebra, subsequent encounter for fracture with routine healing: Secondary | ICD-10-CM | POA: Diagnosis not present

## 2018-12-09 DIAGNOSIS — M545 Low back pain: Secondary | ICD-10-CM | POA: Diagnosis not present

## 2018-12-13 DIAGNOSIS — S22080D Wedge compression fracture of T11-T12 vertebra, subsequent encounter for fracture with routine healing: Secondary | ICD-10-CM | POA: Diagnosis not present

## 2018-12-13 DIAGNOSIS — M545 Low back pain: Secondary | ICD-10-CM | POA: Diagnosis not present

## 2018-12-15 DIAGNOSIS — J069 Acute upper respiratory infection, unspecified: Secondary | ICD-10-CM | POA: Diagnosis not present

## 2018-12-15 DIAGNOSIS — R05 Cough: Secondary | ICD-10-CM | POA: Diagnosis not present

## 2018-12-15 DIAGNOSIS — J40 Bronchitis, not specified as acute or chronic: Secondary | ICD-10-CM | POA: Diagnosis not present

## 2018-12-15 DIAGNOSIS — Z6841 Body Mass Index (BMI) 40.0 and over, adult: Secondary | ICD-10-CM | POA: Diagnosis not present

## 2018-12-16 DIAGNOSIS — M545 Low back pain: Secondary | ICD-10-CM | POA: Diagnosis not present

## 2018-12-16 DIAGNOSIS — S22080D Wedge compression fracture of T11-T12 vertebra, subsequent encounter for fracture with routine healing: Secondary | ICD-10-CM | POA: Diagnosis not present

## 2018-12-20 DIAGNOSIS — S22080D Wedge compression fracture of T11-T12 vertebra, subsequent encounter for fracture with routine healing: Secondary | ICD-10-CM | POA: Diagnosis not present

## 2018-12-20 DIAGNOSIS — M545 Low back pain: Secondary | ICD-10-CM | POA: Diagnosis not present

## 2018-12-23 DIAGNOSIS — M545 Low back pain: Secondary | ICD-10-CM | POA: Diagnosis not present

## 2018-12-23 DIAGNOSIS — S22080D Wedge compression fracture of T11-T12 vertebra, subsequent encounter for fracture with routine healing: Secondary | ICD-10-CM | POA: Diagnosis not present

## 2018-12-27 DIAGNOSIS — S22080D Wedge compression fracture of T11-T12 vertebra, subsequent encounter for fracture with routine healing: Secondary | ICD-10-CM | POA: Diagnosis not present

## 2018-12-27 DIAGNOSIS — M545 Low back pain: Secondary | ICD-10-CM | POA: Diagnosis not present

## 2018-12-30 DIAGNOSIS — S22080D Wedge compression fracture of T11-T12 vertebra, subsequent encounter for fracture with routine healing: Secondary | ICD-10-CM | POA: Diagnosis not present

## 2018-12-30 DIAGNOSIS — M545 Low back pain: Secondary | ICD-10-CM | POA: Diagnosis not present

## 2019-01-04 ENCOUNTER — Encounter (INDEPENDENT_AMBULATORY_CARE_PROVIDER_SITE_OTHER): Payer: Self-pay | Admitting: Family Medicine

## 2019-01-04 ENCOUNTER — Ambulatory Visit (INDEPENDENT_AMBULATORY_CARE_PROVIDER_SITE_OTHER): Payer: BLUE CROSS/BLUE SHIELD | Admitting: Family Medicine

## 2019-01-04 DIAGNOSIS — M5442 Lumbago with sciatica, left side: Secondary | ICD-10-CM | POA: Diagnosis not present

## 2019-01-04 MED ORDER — CYCLOBENZAPRINE HCL 10 MG PO TABS: 10.0000 mg | ORAL_TABLET | Freq: Every evening | ORAL | 3 refills | Status: DC | PRN

## 2019-01-04 MED ORDER — TIZANIDINE HCL 2 MG PO TABS: 2.0000 mg | ORAL_TABLET | Freq: Four times a day (QID) | ORAL | 3 refills | Status: DC | PRN

## 2019-01-04 NOTE — Progress Notes (Signed)
Office Visit Note   Patient: [name]           Date of Birth: Dec 21, 1955           MRN: [id number] Visit Date: 01/04/2019 Requested by: Haywood Pao, MD [address] [place], [place] [address] PCP: Osborne Casco Fransico Him, MD  Subjective: Chief Complaint  Patient presents with  . Lower Back - Follow-up    DOI 11/04/2018 - followup after PT - "feeling better."    HPI: She is here for follow-up of low back pain with T12 compression fracture.  She has completed physical therapy and feels substantially better.  She is very pleased with the strength that she has achieved during this time.  She plans to start doing these exercises at the gym long-term.  She has not had any radicular pain but still gets occasional pain to the left of midline around the compression fracture.              ROS: Noncontributory  Objective: Vital Signs: There were no vitals taken for this visit.  Physical Exam:  Low back: No significant bony tenderness over T12 today, but the left-sided paraspinous muscle is slightly tender and tight.  Imaging: None today.  Assessment & Plan: 1.  Clinically healing T12 compression fracture -Home exercises as directed, follow-up PRN.     Procedures: No procedures performed  No notes on file     PMFS History: Patient Active Problem List   Diagnosis Date Noted  . Abscess of vulva 11/15/2018  . Anxiety 11/15/2018  . Insomnia 11/15/2018  . Chronic diastolic heart failure ([place]) 04/18/2018  . Benign essential HTN 04/18/2018  . Congestive heart failure ([place]) 03/23/2018  . Mild dilation of ascending aorta (HCC)   . Elevated blood pressure reading without diagnosis of hypertension   . Abnormal EKG 02/07/2016  . Edema extremities 02/07/2016  . Joint pain 03/19/2014  . Paroxysmal supraventricular tachycardia ([place]) 12/22/2013  . Breast cancer of upper-outer quadrant of left female breast ([place]) 09/19/2013  . Vaginal atrophy 08/18/2013  . Vaginitis and  vulvovaginitis 08/18/2013  . Pain, pelvic, female 08/18/2013  . Hypothyroidism, postsurgical 04/03/2013   Past Medical History:  Diagnosis Date  . Anxiety   . Arthritis    lower back and thumbs  . Breast cancer of upper-outer quadrant of left female breast ([place]) 09/19/2013  . Cancer [facility])    left breast-(12-12-09 radiation and surgery)-Dr. Jana Hakim -oncology  . Chronic diastolic CHF (congestive heart failure) ([place])   . Colon polyps    sm aden p 10/06 (difficult exam, needs propofol);neg 07/2010  . Dyslipidemia   . Edema extremities 02/07/2016  . GERD (gastroesophageal reflux disease)    [facility] [address] [facility]), neg for metaplasia '97, '08 (mod HH) 9.2011  . HTN (hypertension)   . Hypothyroidism, postsurgical 04/03/2013  . Laryngitis 12-20-12   recent episode is resolving with Amoxicillin orally  . Mild dilation of ascending aorta (HCC)   . Obesity   . Osteoporosis   . Paroxysmal supraventricular tachycardia ([place]) 12/22/2013  . Personal history of radiation therapy    2011  . PONV (postoperative nausea and vomiting)   . Thyroid adenoma 12-20-12   surgery planned 12-30-12  . Vaginal atrophy 08/18/2013  . Vaginitis and vulvovaginitis 08/18/2013    Family History  Problem Relation Age of Onset  . Stroke Mother   . Heart disease Father   . Cancer Brother        melanoma    Past Surgical  History:  Procedure Laterality Date  . BREAST LUMPECTOMY Left    2011  . BREAST SURGERY     left breast lumpectomy  . EYE SURGERY  12-20-12   right eye retina tear repair  . THYROIDECTOMY N/A 12/30/2012   Procedure: THYROIDECTOMY;  Surgeon: Earnstine Regal, MD;  Location: [facility];  Service: General;  Laterality: N/A;   Social History   Occupational History  . Not on file  Tobacco Use  . Smoking status: Former Smoker    Types: Cigarettes    Last attempt to quit: 01/31/2010    Years since quitting: 8.9  . Smokeless tobacco: Never Used  Substance and Sexual Activity  . Alcohol use: Yes    Comment:  social- occ  . Drug use: No  . Sexual activity: Yes    Birth control/protection: Post-menopausal

## 2019-01-12 ENCOUNTER — Telehealth (INDEPENDENT_AMBULATORY_CARE_PROVIDER_SITE_OTHER): Payer: Self-pay

## 2019-01-12 NOTE — Telephone Encounter (Signed)
Tammy from [facility] calling, stating that she has faxed progress report for patient multiple times and needs this signed and faxed back Please call her at [phone]

## 2019-01-12 NOTE — Telephone Encounter (Signed)
I called and spoke with Tammy - she has now received the fax back from our office (Tammy H in our medical records department faxed it this morning). I told her there was no signature section on the form, so we did not realize it needed to be faxed back.  She said she was unsure why it was not on the form, either, but it could have just been a computer error of forgetting to check a box. She understood the reason for the delay.

## 2019-01-16 DIAGNOSIS — I509 Heart failure, unspecified: Secondary | ICD-10-CM

## 2019-01-16 NOTE — Telephone Encounter (Signed)
If she goes to Lasix 40mg  daily please add Kdur 55meq daily and get a BMET in 1-2 weeks

## 2019-01-16 NOTE — Telephone Encounter (Signed)
I think it would be fine to increase Lasix to 40mg  daily and consider compression hose during the day.  IF this does not improve then call back.

## 2019-01-19 MED ORDER — POTASSIUM CHLORIDE CRYS ER 20 MEQ PO TBCR: 20.0000 meq | EXTENDED_RELEASE_TABLET | Freq: Every day | ORAL | 3 refills | Status: DC

## 2019-01-19 MED ORDER — FUROSEMIDE 40 MG PO TABS: 40.0000 mg | ORAL_TABLET | Freq: Every day | ORAL | 3 refills | Status: DC

## 2019-01-19 NOTE — Telephone Encounter (Signed)
Spoke with the patient, on the phone, she stated that she would think about increasing lasix and will contact the office if she does so we can schedule a BMET. She had no further questions.

## 2019-02-17 DIAGNOSIS — E038 Other specified hypothyroidism: Secondary | ICD-10-CM | POA: Diagnosis not present

## 2019-02-17 DIAGNOSIS — E7800 Pure hypercholesterolemia, unspecified: Secondary | ICD-10-CM | POA: Diagnosis not present

## 2019-02-17 DIAGNOSIS — Z0000 Encounter for general adult medical examination without abnormal findings: Secondary | ICD-10-CM | POA: Diagnosis not present

## 2019-02-17 DIAGNOSIS — M810 Age-related osteoporosis without current pathological fracture: Secondary | ICD-10-CM | POA: Diagnosis not present

## 2019-02-24 DIAGNOSIS — F419 Anxiety disorder, unspecified: Secondary | ICD-10-CM | POA: Diagnosis not present

## 2019-02-24 DIAGNOSIS — E039 Hypothyroidism, unspecified: Secondary | ICD-10-CM | POA: Diagnosis not present

## 2019-02-24 DIAGNOSIS — Z0000 Encounter for general adult medical examination without abnormal findings: Secondary | ICD-10-CM | POA: Diagnosis not present

## 2019-02-24 DIAGNOSIS — E7800 Pure hypercholesterolemia, unspecified: Secondary | ICD-10-CM | POA: Diagnosis not present

## 2019-02-24 DIAGNOSIS — Z1331 Encounter for screening for depression: Secondary | ICD-10-CM | POA: Diagnosis not present

## 2019-04-12 ENCOUNTER — Other Ambulatory Visit: Payer: Self-pay | Admitting: Internal Medicine

## 2019-04-12 DIAGNOSIS — Z1231 Encounter for screening mammogram for malignant neoplasm of breast: Secondary | ICD-10-CM

## 2019-04-14 ENCOUNTER — Ambulatory Visit: Payer: BLUE CROSS/BLUE SHIELD | Admitting: Cardiology

## 2019-04-25 ENCOUNTER — Ambulatory Visit (INDEPENDENT_AMBULATORY_CARE_PROVIDER_SITE_OTHER): Payer: BC Managed Care – PPO | Admitting: Family Medicine

## 2019-04-25 ENCOUNTER — Other Ambulatory Visit: Payer: Self-pay

## 2019-04-25 ENCOUNTER — Encounter: Payer: Self-pay | Admitting: Family Medicine

## 2019-04-25 ENCOUNTER — Ambulatory Visit: Payer: BC Managed Care – PPO

## 2019-04-25 DIAGNOSIS — M5442 Lumbago with sciatica, left side: Secondary | ICD-10-CM | POA: Diagnosis not present

## 2019-04-25 MED ORDER — TIZANIDINE HCL 2 MG PO TABS: 2.0000 mg | ORAL_TABLET | Freq: Four times a day (QID) | ORAL | 3 refills | Status: DC | PRN

## 2019-04-25 NOTE — Progress Notes (Signed)
Office Visit Note   Patient: [name]           Date of Birth: 01/18/1956           MRN: [id number] Visit Date: 04/25/2019 Requested by: Haywood Pao, MD [address] [place],  [place] [address] PCP: Osborne Casco Fransico Him, MD  Subjective: Chief Complaint  Patient presents with  . Lower Back - Pain    Has been having pain across the lower back x 3 weeks, "across my hip line."  No injury.     HPI: She is here with worsening low back pain.  T12 compression fracture in January was getting better with physical therapy, but about 3 weeks ago she did a lot of housecleaning and afterward had quite a bit of pain in the lower lumbar spine radiating across both sides.  Occasional pain into the left leg, occasional numbness sooner legs but no weakness or bowel or bladder dysfunction.  Zanaflex helps.  She would like to have new x-rays.              ROS: No fevers or chills.  All other systems were reviewed and are negative.  Objective: Vital Signs: There were no vitals taken for this visit.  Physical Exam:  General:  Alert and oriented, in no acute distress. Pulm:  Breathing unlabored. Psy:  Normal mood, congruent affect. Skin: No rash on her skin. Low back: No further tenderness to palpation over T12.  Moderate tenderness near the L5-S1 level.  Tender in the left sciatic notch, negative straight leg raise.  Lower extremity strength and reflexes are still normal.  Imaging: X-rays lumbar spine: T12 compression deformity, anterior wedge compression, appears unchanged.  No sign of progression.  She has L4-5 and L5-S1 degenerative disc disease.  No new abnormalities.     Assessment & Plan: 1.  Clinically healed T12 compression fracture with recurrent lumbar pain most likely due to either lower lumbar facet arthropathy or disc protrusion with foraminal stenosis. -Refilled tizanidine.  She plans to start water aerobics.  She will contact me for another physical therapy referral if  needed.  Facet or epidural injections could be contemplated in the future if symptoms do not respond.     Procedures: No procedures performed  No notes on file     PMFS History: Patient Active Problem List   Diagnosis Date Noted  . Abscess of vulva 11/15/2018  . Anxiety 11/15/2018  . Insomnia 11/15/2018  . Chronic diastolic heart failure ([place]) 04/18/2018  . Benign essential HTN 04/18/2018  . Congestive heart failure ([place]) 03/23/2018  . Mild dilation of ascending aorta (HCC)   . Elevated blood pressure reading without diagnosis of hypertension   . Abnormal EKG 02/07/2016  . Edema extremities 02/07/2016  . Joint pain 03/19/2014  . Paroxysmal supraventricular tachycardia ([place]) 12/22/2013  . Breast cancer of upper-outer quadrant of left female breast ([place]) 09/19/2013  . Vaginal atrophy 08/18/2013  . Vaginitis and vulvovaginitis 08/18/2013  . Pain, pelvic, female 08/18/2013  . Hypothyroidism, postsurgical 04/03/2013   Past Medical History:  Diagnosis Date  . Anxiety   . Arthritis    lower back and thumbs  . Breast cancer of upper-outer quadrant of left female breast ([place]) 09/19/2013  . Cancer [facility])    left breast-(12-12-09 radiation and surgery)-Dr. Jana Hakim -oncology  . Chronic diastolic CHF (congestive heart failure) ([place])   . Colon polyps    sm aden p 10/06 (difficult exam, needs propofol);neg 07/2010  . Dyslipidemia   .  Edema extremities 02/07/2016  . GERD (gastroesophageal reflux disease)    [facility] [address] [facility]), neg for metaplasia '97, '08 (mod HH) 9.2011  . HTN (hypertension)   . Hypothyroidism, postsurgical 04/03/2013  . Laryngitis 12-20-12   recent episode is resolving with Amoxicillin orally  . Mild dilation of ascending aorta (HCC)   . Obesity   . Osteoporosis   . Paroxysmal supraventricular tachycardia ([place]) 12/22/2013  . Personal history of radiation therapy    2011  . PONV (postoperative nausea and vomiting)   . Thyroid adenoma 12-20-12   surgery planned  12-30-12  . Vaginal atrophy 08/18/2013  . Vaginitis and vulvovaginitis 08/18/2013    Family History  Problem Relation Age of Onset  . Stroke Mother   . Heart disease Father   . Cancer Brother        melanoma    Past Surgical History:  Procedure Laterality Date  . BREAST LUMPECTOMY Left    2011  . BREAST SURGERY     left breast lumpectomy  . EYE SURGERY  12-20-12   right eye retina tear repair  . THYROIDECTOMY N/A 12/30/2012   Procedure: THYROIDECTOMY;  Surgeon: Earnstine Regal, MD;  Location: [facility];  Service: General;  Laterality: N/A;   Social History   Occupational History  . Not on file  Tobacco Use  . Smoking status: Former Smoker    Types: Cigarettes    Quit date: 01/31/2010    Years since quitting: 9.2  . Smokeless tobacco: Never Used  Substance and Sexual Activity  . Alcohol use: Yes    Comment: social- occ  . Drug use: No  . Sexual activity: Yes    Birth control/protection: Post-menopausal

## 2019-05-14 ENCOUNTER — Other Ambulatory Visit: Payer: Self-pay | Admitting: Cardiology

## 2019-05-17 DIAGNOSIS — D225 Melanocytic nevi of trunk: Secondary | ICD-10-CM | POA: Diagnosis not present

## 2019-05-17 DIAGNOSIS — Z1283 Encounter for screening for malignant neoplasm of skin: Secondary | ICD-10-CM | POA: Diagnosis not present

## 2019-05-17 DIAGNOSIS — L218 Other seborrheic dermatitis: Secondary | ICD-10-CM | POA: Diagnosis not present

## 2019-05-19 ENCOUNTER — Telehealth: Payer: Self-pay | Admitting: Cardiology

## 2019-05-19 NOTE — Telephone Encounter (Signed)
New Message ° ° ° °Left message to confirm appt and get consent  °

## 2019-05-21 NOTE — Progress Notes (Signed)
Virtual Visit via Video Note   This visit type was conducted due to national recommendations for restrictions regarding the COVID-19 Pandemic (e.g. social distancing) in an effort to limit this patient's exposure and mitigate transmission in our community.  Due to her co-morbid illnesses, this patient is at least at moderate risk for complications without adequate follow up.  This format is felt to be most appropriate for this patient at this time.  The patient did not have access to video technology/had technical difficulties with video requiring transitioning to audio format only (telephone).  All issues noted in this document were discussed and addressed.  No physical exam could be performed with this format.  Please refer to the patient's chart for her  consent to telehealth for [facility].   Evaluation Performed:  Follow-up visit  This visit type was conducted due to national recommendations for restrictions regarding the COVID-19 Pandemic (e.g. social distancing).  This format is felt to be most appropriate for this patient at this time.  All issues noted in this document were discussed and addressed.  No physical exam was performed (except for noted visual exam findings with Video Visits).  Please refer to the patient's chart (MyChart message for video visits and phone note for telephone visits) for the patient's consent to telehealth for [facility].  Date:  05/22/2019   ID:  [name], DOB 10/14/1956, MRN [id number]  Patient Location:  Home  Provider location:   [place]  PCP:  Tisovec, Fransico Him, MD  Cardiologist:  Fransico Him, MD  Electrophysiologist:  None   Chief Complaint:  SVT, CHF, dilated aorta  History of Present Illness:    [name] is a 63 y.o. female who presents via audio/video conferencing for a telehealth visit today.    [name] is a 63 y.o. female with a hx of PSVT, mildly dilated ascending aorta, thyroid adenoma,  remote breast CA, former tobacco abuse, dyslipidemia (followed by PCP), GERD,recent mild HTN,obesity, arthritis, Barrett's esophagus who presents for f/u dyspnea.  Her last echo on 09/16/2017 showed normal LV function with EF 60 to 65% with grade 2 diastolic dysfunction, mildly dilated a sending aorta at 38 mm.  She is here today for followup and is doing well.  She denies any chest pain or pressure, PND, orthopnea, dizziness, palpitations or syncope. She intermittently has some DOE with walking her dog in the heat but is able to swim without any problems.  She also had occasional LE edema which is fairly well controlled on lasix 20mg  daily but occasionally will flare up.  She is compliant with her meds and is tolerating meds with no SE.    The patient does not have symptoms concerning for COVID-19 infection (fever, chills, cough, or new shortness of breath).    Prior CV studies:   The following studies were reviewed today:  none  Past Medical History:  Diagnosis Date  . Anxiety   . Arthritis    lower back and thumbs  . Breast cancer of upper-outer quadrant of left female breast ([place]) 09/19/2013  . Cancer [facility])    left breast-(12-12-09 radiation and surgery)-Dr. Jana Hakim -oncology  . Chronic diastolic CHF (congestive heart failure) ([place])   . Colon polyps    sm aden p 10/06 (difficult exam, needs propofol);neg 07/2010  . Dyslipidemia   . Edema extremities 02/07/2016  . GERD (gastroesophageal reflux disease)    Barrett's 95 [facility]), neg for metaplasia '97, '08 (mod HH) 9.2011  . HTN (  hypertension)   . Hypothyroidism, postsurgical 04/03/2013  . Laryngitis 12-20-12   recent episode is resolving with Amoxicillin orally  . Mild dilation of ascending aorta (HCC)   . Obesity   . Osteoporosis   . Paroxysmal supraventricular tachycardia ([place]) 12/22/2013  . Personal history of radiation therapy    2011  . PONV (postoperative nausea and vomiting)   . Thyroid adenoma 12-20-12   surgery planned  12-30-12  . Vaginal atrophy 08/18/2013  . Vaginitis and vulvovaginitis 08/18/2013   Past Surgical History:  Procedure Laterality Date  . BREAST LUMPECTOMY Left    2011  . BREAST SURGERY     left breast lumpectomy  . EYE SURGERY  12-20-12   right eye retina tear repair  . THYROIDECTOMY N/A 12/30/2012   Procedure: THYROIDECTOMY;  Surgeon: Earnstine Regal, MD;  Location: [facility]S;  Service: General;  Laterality: N/A;     Current Meds  Medication Sig  . ALPRAZolam (XANAX) 1 MG tablet Take 1 mg by mouth at bedtime as needed.  Marland Kitchen aspirin EC 81 MG tablet Take 81 mg by mouth every other day. Sundays and Thursdays  . Calcium-Vitamin D-Vitamin K (VIACTIV CALCIUM PLUS D) 650-12.5-40 MG-MCG-MCG CHEW Chew by mouth.  . clobetasol (OLUX) 0.05 % topical foam APPLY TOPICALLY TO AFFECTED AREA SCALP UP TO TWICE A DAY AS NEEDED ( NOT TO FACE GROIN UNDERARM)  . Coenzyme Q10 (CO Q 10 PO) Take 20 mg by mouth as directed.  . cyclobenzaprine (FLEXERIL) 10 MG tablet Take 1 tablet (10 mg total) by mouth at bedtime as needed for muscle spasms.  Marland Kitchen diltiazem (CARTIA XT) 120 MG 24 hr capsule Take 1 capsule by mouth in the morning  . ELDERBERRY PO Take by mouth. With zinc. 2 chewables daily  . fluticasone (CUTIVATE) 0.05 % cream APPLY CREAM TOPICALLY TO AFFECTED AREA UP TO TWICE DAILY AS NEEDED  . furosemide (LASIX) 20 MG tablet Take 20 mg by mouth daily.  . halobetasol (ULTRAVATE) 0.05 % cream as needed.   . hydrOXYzine (VISTARIL) 25 MG capsule Take 1 capsule by mouth daily as needed. itching  . levothyroxine (SYNTHROID, LEVOTHROID) 137 MCG tablet Take 274 mcg by mouth daily before breakfast.  . magnesium oxide (MAG-OX) 400 MG tablet Take 400 mg by mouth daily.  . Multiple Vitamin (MULTIVITAMIN) tablet Take 1 tablet by mouth daily.  . Omega-3 Fatty Acids (FISH OIL) 1200 MG CAPS Take 1 capsule daily by mouth.  Marland Kitchen omeprazole (PRILOSEC) 40 MG capsule Take 40 mg by mouth daily.  . pantoprazole (PROTONIX) 40 MG tablet Take 1  tablet (40 mg total) daily by mouth.  . pravastatin (PRAVACHOL) 20 MG tablet Take 20 mg every evening by mouth.  . sertraline (ZOLOFT) 100 MG tablet Take 150 mg by mouth daily.  . sodium chloride (OCEAN) 0.65 % SOLN nasal spray Place 1 spray into the nose at bedtime.  Marland Kitchen tiZANidine (ZANAFLEX) 2 MG tablet Take 1-2 tablets (2-4 mg total) by mouth every 6 (six) hours as needed for muscle spasms.  . Vitamin D-Vitamin K (VITAMIN K2-VITAMIN D3 PO) Take by mouth.  . [DISCONTINUED] furosemide (LASIX) 40 MG tablet Take 1 tablet (40 mg total) by mouth daily. (Patient taking differently: Take 20 mg by mouth daily. )     Allergies:   Sulfa antibiotics, Sulfa drugs cross reactors, and Venlafaxine   Social History   Tobacco Use  . Smoking status: Former Smoker    Types: Cigarettes    Quit date: 01/31/2010  Years since quitting: 9.3  . Smokeless tobacco: Never Used  Substance Use Topics  . Alcohol use: Yes    Comment: social- occ  . Drug use: No     Family Hx: The patient's family history includes Cancer in her brother; Heart disease in her father; Stroke in her mother.  ROS:   Please see the history of present illness.     All other systems reviewed and are negative.   Labs/Other Tests and Data Reviewed:    Recent Labs: No results found for requested labs within last 8760 hours.   Recent Lipid Panel No results found for: CHOL, TRIG, HDL, CHOLHDL, LDLCALC, LDLDIRECT  Wt Readings from Last 3 Encounters:  05/22/19 242 lb (109.8 kg)  04/18/18 243 lb 6.4 oz (110.4 kg)  10/19/17 239 lb 12.8 oz (108.8 kg)     Objective:    Vital Signs:  BP 120/70   Ht 5\' 6"  (1.676 m)   Wt 242 lb (109.8 kg)   BMI 39.06 kg/m    CONSTITUTIONAL:  Well nourished, well developed female in no acute distress.  EYES: anicteric MOUTH: oral mucosa is pink RESPIRATORY: Normal respiratory effort, symmetric expansion CARDIOVASCULAR: No peripheral edema SKIN: No rash, lesions or ulcers MUSCULOSKELETAL: no  digital cyanosis NEURO: Cranial Nerves II-XII grossly intact, moves all extremities PSYCH: Intact judgement and insight.  A&O x 3, Mood/affect appropriate   ASSESSMENT & PLAN:    1.  PSVT -she has not had any palpitations -continue Cardizem CD  2.  Dilated ascending aorta -Echo 09/2017 showed dilated ascending aorta measuring 70mm. -repeat 2D echo to reassess  3.  Chronic diastolic CHF -she appears euvolemic -her weight has been stable -she recently called stating that she has LE edema and started wearing compression hose.  She still has some LE edema.  -she occasionally has some DOE walking up hills with her dog but is very random.  She does not get SOB with swimming. -she will continue on lasix 20mg  daily and an extra 20mg  on days she has LE edema -Creatinine was 0.9 in April  4.  Hypertension -her BP has been controlled at home -She will continue on Cardizem XT 120mg  daily  COVID-19 Education: The signs and symptoms of COVID-19 were discussed with the patient and how to seek care for testing (follow up with PCP or arrange E-visit).  The importance of social distancing was discussed today.  Patient Risk:   After full review of this patient's clinical status, I feel that they are at least moderate risk at this time.  Time:   Today, I have spent 20 minutes directly with telemedicine discussing medical problems including SVT, HTN, CHF.  We also reviewed the symptoms of COVID 19 and the ways to protect against contracting the virus with telehealth technology.  I spent an additional 5 minutes reviewing patient's chart including labs, 2D echo.  Medication Adjustments/Labs and Tests Ordered: Current medicines are reviewed at length with the patient today.  Concerns regarding medicines are outlined above.  Tests Ordered: No orders of the defined types were placed in this encounter.  Medication Changes: No orders of the defined types were placed in this encounter.   Disposition:   Follow up in 6 month(s)  Signed, Fransico Him, MD  05/22/2019 10:51 AM    [facility]

## 2019-05-22 ENCOUNTER — Encounter: Payer: Self-pay | Admitting: Cardiology

## 2019-05-22 ENCOUNTER — Telehealth (INDEPENDENT_AMBULATORY_CARE_PROVIDER_SITE_OTHER): Payer: BC Managed Care – PPO | Admitting: Cardiology

## 2019-05-22 ENCOUNTER — Other Ambulatory Visit: Payer: Self-pay

## 2019-05-22 VITALS — BP 120/70 | Ht 66.0 in | Wt 242.0 lb

## 2019-05-22 DIAGNOSIS — I77810 Thoracic aortic ectasia: Secondary | ICD-10-CM

## 2019-05-22 DIAGNOSIS — I5032 Chronic diastolic (congestive) heart failure: Secondary | ICD-10-CM

## 2019-05-22 DIAGNOSIS — I471 Supraventricular tachycardia: Secondary | ICD-10-CM | POA: Diagnosis not present

## 2019-05-22 DIAGNOSIS — I10 Essential (primary) hypertension: Secondary | ICD-10-CM

## 2019-05-22 NOTE — Patient Instructions (Signed)
Your physician has recommended you make the following change in your medication:  [place] 20 MG OF FUROSEMIDE AS NEEDED FOR SWELLING  Your physician has requested that you have an echocardiogram. Echocardiography is a painless test that uses sound waves to create images of your heart. It provides your doctor with information about the size and shape of your heart and how well your heart's chambers and valves are working. This procedure takes approximately one hour. There are no restrictions for this procedure.  Your physician wants you to follow-up in:   [place] will receive a reminder letter in the mail two months in advance. If you don't receive a letter, please call our office to schedule the follow-up appointment.

## 2019-05-29 ENCOUNTER — Other Ambulatory Visit: Payer: Self-pay

## 2019-05-29 ENCOUNTER — Ambulatory Visit
Admission: RE | Admit: 2019-05-29 | Discharge: 2019-05-29 | Disposition: A | Discharge status: Home / Self Care | Payer: BC Managed Care – PPO | Source: Ambulatory Visit | Attending: Internal Medicine | Admitting: Internal Medicine

## 2019-05-29 DIAGNOSIS — Z1231 Encounter for screening mammogram for malignant neoplasm of breast: Secondary | ICD-10-CM

## 2019-06-06 ENCOUNTER — Telehealth: Payer: Self-pay | Admitting: Cardiology

## 2019-06-06 ENCOUNTER — Ambulatory Visit (HOSPITAL_COMMUNITY): Payer: BC Managed Care – PPO | Attending: Cardiovascular Disease

## 2019-06-06 ENCOUNTER — Other Ambulatory Visit: Payer: Self-pay

## 2019-06-06 DIAGNOSIS — I5032 Chronic diastolic (congestive) heart failure: Secondary | ICD-10-CM | POA: Diagnosis not present

## 2019-06-06 NOTE — Telephone Encounter (Signed)
Returned pt's call and she has been made aware of her echocardiogram results.

## 2019-06-06 NOTE — Telephone Encounter (Signed)
New message   Patient is returning call for echo results. Please call. 

## 2019-07-13 DIAGNOSIS — Z23 Encounter for immunization: Secondary | ICD-10-CM | POA: Diagnosis not present

## 2019-07-31 DIAGNOSIS — I781 Nevus, non-neoplastic: Secondary | ICD-10-CM | POA: Diagnosis not present

## 2019-08-20 ENCOUNTER — Other Ambulatory Visit: Payer: Self-pay | Admitting: Cardiology

## 2019-08-20 ENCOUNTER — Other Ambulatory Visit: Payer: Self-pay | Admitting: Physician Assistant

## 2019-08-23 DIAGNOSIS — F419 Anxiety disorder, unspecified: Secondary | ICD-10-CM | POA: Diagnosis not present

## 2019-08-23 DIAGNOSIS — K219 Gastro-esophageal reflux disease without esophagitis: Secondary | ICD-10-CM | POA: Diagnosis not present

## 2019-08-23 DIAGNOSIS — E039 Hypothyroidism, unspecified: Secondary | ICD-10-CM | POA: Diagnosis not present

## 2019-08-23 DIAGNOSIS — I519 Heart disease, unspecified: Secondary | ICD-10-CM | POA: Diagnosis not present

## 2019-08-23 DIAGNOSIS — C50912 Malignant neoplasm of unspecified site of left female breast: Secondary | ICD-10-CM | POA: Diagnosis not present

## 2019-08-23 DIAGNOSIS — Z23 Encounter for immunization: Secondary | ICD-10-CM | POA: Diagnosis not present

## 2019-08-23 DIAGNOSIS — M810 Age-related osteoporosis without current pathological fracture: Secondary | ICD-10-CM | POA: Diagnosis not present

## 2019-08-23 DIAGNOSIS — E7800 Pure hypercholesterolemia, unspecified: Secondary | ICD-10-CM | POA: Diagnosis not present

## 2019-09-13 ENCOUNTER — Ambulatory Visit: Payer: BC Managed Care – PPO | Admitting: Family Medicine

## 2019-09-21 ENCOUNTER — Other Ambulatory Visit: Payer: Self-pay

## 2019-09-21 ENCOUNTER — Ambulatory Visit: Payer: PPO | Admitting: Family Medicine

## 2019-09-21 ENCOUNTER — Encounter: Payer: Self-pay | Admitting: Family Medicine

## 2019-09-21 DIAGNOSIS — G8929 Other chronic pain: Secondary | ICD-10-CM

## 2019-09-21 DIAGNOSIS — M5442 Lumbago with sciatica, left side: Secondary | ICD-10-CM

## 2019-09-21 NOTE — Progress Notes (Signed)
Office Visit Note   Patient: [name]           Date of Birth: 09-Oct-1956           MRN: [id number] Visit Date: 09/21/2019 Requested by: Haywood Pao, MD [address] [place],  [place] [address] PCP: Osborne Casco Fransico Him, MD  Subjective: Chief Complaint  Patient presents with  . Lower Back - Pain    Was having pain in the left lower back and down the left leg with numbness in the left foot. It got better 2 weeks. I s ok today. Bought a specific pillow for sciatica to sit on 2 weeks ago - her symptoms went away.    HPI: She is here with left-sided low back and leg pain.  She had a flareup about 2 or 3 weeks ago with no injury.  Pain was radiating down her leg much more intensely than usual, and eventually started resulting in tingling in her foot.  She called to make the appointment and in the meantime she purchased a lumbar pillow and started using it, and it made a dramatic difference in her pain.  Now she is virtually pain-free, only has a twinge of pain and the numbness is gone.  She was using Zanaflex and Xanax at bedtime to help her sleep.  She does not need that now.              ROS: No fevers or chills, no rash.  All other systems were reviewed and are negative.  Objective: Vital Signs: There were no vitals taken for this visit.  Physical Exam:  General:  Alert and oriented, in no acute distress. Pulm:  Breathing unlabored. Psy:  Normal mood, congruent affect.  Low back: Very slight tenderness to the left of midline at the L5-S1 level.  No pain in the sciatic notch, negative straight leg raise.  Lower extremity strength and reflexes are normal.  Imaging: None today.  Assessment & Plan: 1.  Almost resolved left-sided low back pain -She will call for medicine refills when needed.  See her back as needed.     Procedures: No procedures performed  No notes on file     PMFS History: Patient Active Problem List   Diagnosis Date Noted  . Abscess  of vulva 11/15/2018  . Anxiety 11/15/2018  . Insomnia 11/15/2018  . Chronic diastolic heart failure ([place]) 04/18/2018  . Benign essential HTN 04/18/2018  . Congestive heart failure ([place]) 03/23/2018  . Mild dilation of ascending aorta (HCC)   . Elevated blood pressure reading without diagnosis of hypertension   . Abnormal EKG 02/07/2016  . Edema extremities 02/07/2016  . Joint pain 03/19/2014  . Paroxysmal supraventricular tachycardia ([place]) 12/22/2013  . Breast cancer of upper-outer quadrant of left female breast ([place]) 09/19/2013  . Vaginal atrophy 08/18/2013  . Vaginitis and vulvovaginitis 08/18/2013  . Pain, pelvic, female 08/18/2013  . Hypothyroidism, postsurgical 04/03/2013   Past Medical History:  Diagnosis Date  . Anxiety   . Arthritis    lower back and thumbs  . Breast cancer of upper-outer quadrant of left female breast ([place]) 09/19/2013  . Cancer [facility])    left breast-(12-12-09 radiation and surgery)-Dr. Jana Hakim -oncology  . Chronic diastolic CHF (congestive heart failure) ([place])   . Colon polyps    sm aden p 10/06 (difficult exam, needs propofol);neg 07/2010  . Dyslipidemia   . Edema extremities 02/07/2016  . GERD (gastroesophageal reflux disease)    Barrett's 95 ([facility]), neg  for metaplasia '97, '08 (mod HH) 9.2011  . HTN (hypertension)   . Hypothyroidism, postsurgical 04/03/2013  . Laryngitis 12-20-12   recent episode is resolving with Amoxicillin orally  . Mild dilation of ascending aorta (HCC)   . Obesity   . Osteoporosis   . Paroxysmal supraventricular tachycardia ([place]) 12/22/2013  . Personal history of radiation therapy    2011  . PONV (postoperative nausea and vomiting)   . Thyroid adenoma 12-20-12   surgery planned 12-30-12  . Vaginal atrophy 08/18/2013  . Vaginitis and vulvovaginitis 08/18/2013    Family History  Problem Relation Age of Onset  . Stroke Mother   . Heart disease Father   . Cancer Brother        melanoma    Past Surgical History:  Procedure  Laterality Date  . BREAST LUMPECTOMY Left    2011  . BREAST SURGERY     left breast lumpectomy  . EYE SURGERY  12-20-12   right eye retina tear repair  . THYROIDECTOMY N/A 12/30/2012   Procedure: THYROIDECTOMY;  Surgeon: Earnstine Regal, MD;  Location: [facility];  Service: General;  Laterality: N/A;   Social History   Occupational History  . Not on file  Tobacco Use  . Smoking status: Former Smoker    Types: Cigarettes    Quit date: 01/31/2010    Years since quitting: 9.6  . Smokeless tobacco: Never Used  Substance and Sexual Activity  . Alcohol use: Yes    Comment: social- occ  . Drug use: No  . Sexual activity: Yes    Birth control/protection: Post-menopausal

## 2019-11-22 NOTE — Progress Notes (Signed)
Cardiology Office Note:    Date:  11/23/2019   ID:  [name] 1956/08/02, MRN [id number]  PCP:  Tina Pao, MD  Cardiologist:  Tina Him, MD    Referring MD: Tina Pao, MD   Chief Complaint  Patient presents with  . Follow-up    SVT, HTN, dilated aorta, chronic diastolic CHF    History of Present Illness:    [name] is a 64 y.o. female with a hx of PSVT, mildly dilated ascending aorta, thyroid adenoma, remote breast CA, former tobacco abuse, dyslipidemia (followed by PCP), GERD,recent mild HTN,obesity, arthritis, Barrett's esophagus who presents for f/u dyspnea.Her last echo on 09/16/2017 showed normal LV function with EF 60 to 65% with grade 2 diastolic dysfunction, mildly dilated ascending aorta at 38 mm.  She is here today for followup and is doing well.  She has chronic DOE when walking the dog up hills but that is stable. She denies any chest pain or pressure, PND, orthopnea, LE edema, dizziness, palpitations or syncope. She is compliant with her meds and is tolerating meds with no SE.     Past Medical History:  Diagnosis Date  . Anxiety   . Arthritis    lower back and thumbs  . Breast cancer of upper-outer quadrant of left female breast ([place]) 09/19/2013  . Cancer [facility])    left breast-(12-12-09 radiation and surgery)-Dr. Jana Hakim -oncology  . Chronic diastolic CHF (congestive heart failure) ([place])   . Colon polyps    sm aden p 10/06 (difficult exam, needs propofol);neg 07/2010  . Dyslipidemia   . Edema extremities 02/07/2016  . GERD (gastroesophageal reflux disease)    Barrett's 95 [facility]), neg for metaplasia '97, '08 (mod HH) 9.2011  . HTN (hypertension)   . Hypothyroidism, postsurgical 04/03/2013  . Laryngitis 12-20-12   recent episode is resolving with Amoxicillin orally  . Mild dilation of ascending aorta (HCC)   . Obesity   . Osteoporosis   . Paroxysmal supraventricular tachycardia ([place]) 12/22/2013  . Personal  history of radiation therapy    2011  . PONV (postoperative nausea and vomiting)   . Thyroid adenoma 12-20-12   surgery planned 12-30-12  . Vaginal atrophy 08/18/2013  . Vaginitis and vulvovaginitis 08/18/2013    Past Surgical History:  Procedure Laterality Date  . BREAST LUMPECTOMY Left    2011  . BREAST SURGERY     left breast lumpectomy  . EYE SURGERY  12-20-12   right eye retina tear repair  . THYROIDECTOMY N/A 12/30/2012   Procedure: THYROIDECTOMY;  Surgeon: Earnstine Regal, MD;  Location: [facility];  Service: General;  Laterality: N/A;    Current Medications: Current Meds  Medication Sig  . ALPRAZolam (XANAX) 1 MG tablet Take 1 mg by mouth at bedtime as needed.  Marland Kitchen aspirin EC 81 MG tablet Take 81 mg by mouth every other day. Sundays and Thursdays  . Calcium-Vitamin D-Vitamin K (VIACTIV CALCIUM PLUS D) 650-12.5-40 MG-MCG-MCG CHEW Chew by mouth.  . clobetasol (OLUX) 0.05 % topical foam APPLY TOPICALLY TO AFFECTED AREA SCALP UP TO TWICE A DAY AS NEEDED ( NOT TO FACE GROIN UNDERARM)  . Coenzyme Q10 (CO Q 10 PO) Take 20 mg by mouth as directed.  . cyclobenzaprine (FLEXERIL) 10 MG tablet Take 1 tablet (10 mg total) by mouth at bedtime as needed for muscle spasms.  Marland Kitchen diltiazem (CARTIA XT) 120 MG 24 hr capsule Take 1 capsule by mouth in the morning  . ELDERBERRY PO Take  by mouth. With zinc. 2 chewables daily  . fluticasone (CUTIVATE) 0.05 % cream APPLY CREAM TOPICALLY TO AFFECTED AREA UP TO TWICE DAILY AS NEEDED  . furosemide (LASIX) 20 MG tablet Take 1 tablet by mouth once daily  . hydrOXYzine (VISTARIL) 25 MG capsule Take 1 capsule by mouth daily as needed. itching  . levothyroxine (SYNTHROID, LEVOTHROID) 137 MCG tablet Take 274 mcg by mouth daily before breakfast.  . magnesium oxide (MAG-OX) 400 MG tablet Take 400 mg by mouth daily.  . Multiple Vitamin (MULTIVITAMIN) tablet Take 1 tablet by mouth daily.  . Omega-3 Fatty Acids (FISH OIL) 1200 MG CAPS Take 1 capsule daily by mouth.  Marland Kitchen  omeprazole (PRILOSEC) 40 MG capsule Take 40 mg by mouth daily.  . pantoprazole (PROTONIX) 40 MG tablet Take 1 tablet (40 mg total) daily by mouth.  . pravastatin (PRAVACHOL) 20 MG tablet Take 20 mg every evening by mouth.  . sertraline (ZOLOFT) 100 MG tablet Take 150 mg by mouth daily.  . sodium chloride (OCEAN) 0.65 % SOLN nasal spray Place 1 spray into the nose at bedtime.  Marland Kitchen tiZANidine (ZANAFLEX) 2 MG tablet Take 1-2 tablets (2-4 mg total) by mouth every 6 (six) hours as needed for muscle spasms.  . Vitamin D-Vitamin K (VITAMIN K2-VITAMIN D3 PO) Take by mouth.     Allergies:   Sulfa antibiotics, Sulfa drugs cross reactors, and Venlafaxine   Social History   Socioeconomic History  . Marital status: Married    Spouse name: Not on file  . Number of children: Not on file  . Years of education: Not on file  . Highest education level: Not on file  Occupational History  . Not on file  Tobacco Use  . Smoking status: Former Smoker    Types: Cigarettes    Quit date: 01/31/2010    Years since quitting: 9.8  . Smokeless tobacco: Never Used  Substance and Sexual Activity  . Alcohol use: Yes    Comment: social- occ  . Drug use: No  . Sexual activity: Yes    Birth control/protection: Post-menopausal  Other Topics Concern  . Not on file  Social History Narrative  . Not on file   Social Determinants of Health   Financial Resource Strain:   . Difficulty of Paying Living Expenses: Not on file  Food Insecurity:   . Worried About Charity fundraiser in the Last Year: Not on file  . Ran Out of Food in the Last Year: Not on file  Transportation Needs:   . Lack of Transportation (Medical): Not on file  . Lack of Transportation (Non-Medical): Not on file  Physical Activity:   . Days of Exercise per Week: Not on file  . Minutes of Exercise per Session: Not on file  Stress:   . Feeling of Stress : Not on file  Social Connections:   . Frequency of Communication with Friends and Family: Not  on file  . Frequency of Social Gatherings with Friends and Family: Not on file  . Attends Religious Services: Not on file  . Active Member of Clubs or Organizations: Not on file  . Attends Archivist Meetings: Not on file  . Marital Status: Not on file     Family History: The patient's family history includes Cancer in her brother; Heart disease in her father; Stroke in her mother.  ROS:   Please see the history of present illness.    ROS  All other systems reviewed and negative.  EKGs/Labs/Other Studies Reviewed:    The following studies were reviewed today: EKG, labs  EKG:  EKG is  ordered today.  The ekg ordered today demonstrates NSR with nonspecific T wave abnormality  Recent Labs: No results found for requested labs within last 8760 hours.   Recent Lipid Panel No results found for: CHOL, TRIG, HDL, CHOLHDL, VLDL, LDLCALC, LDLDIRECT  Physical Exam:    VS:  BP 130/78   Pulse 78   Ht 5\' 6"  (1.676 m)   Wt 245 lb 12.8 oz (111.5 kg)   BMI 39.67 kg/m     Wt Readings from Last 3 Encounters:  11/23/19 245 lb 12.8 oz (111.5 kg)  05/22/19 242 lb (109.8 kg)  04/18/18 243 lb 6.4 oz (110.4 kg)     GEN:  Well nourished, well developed in no acute distress HEENT: Normal NECK: No JVD; No carotid bruits LYMPHATICS: No lymphadenopathy CARDIAC: RRR, no murmurs, rubs, gallops RESPIRATORY:  Clear to auscultation without rales, wheezing or rhonchi  ABDOMEN: Soft, non-tender, non-distended MUSCULOSKELETAL:  No edema; No deformity  SKIN: Warm and dry NEUROLOGIC:  Alert and oriented x 3 PSYCHIATRIC:  Normal affect   ASSESSMENT:    1. Paroxysmal supraventricular tachycardia ([place])   2. Mild dilation of ascending aorta (HCC)   3. Chronic diastolic heart failure ([place])   4. Benign essential HTN    PLAN:    In order of problems listed above:  1.  PSVT -denies any palpitations -continue CardizemCD 120mg  daily  2.  Dilated ascending aorta -2D echo with normal  aortic root and ascending aortic diameter  3.  Chronic diastolic CHF -she appears euvolemic and weight is stable -she denies any SOB and LE edema is stable -she has had some dry mouth and may be due to lasix -I told her it was fine to only take her Lasix PRN to see if that helps with her dry mouth -creatinine stable at 0.9 in Oct 2020  4.  HTN -BP controlled -continue on Cardizem XT 120mg  daily   Medication Adjustments/Labs and Tests Ordered: Current medicines are reviewed at length with the patient today.  Concerns regarding medicines are outlined above.  Orders Placed This Encounter  Procedures  . EKG 12-Lead   No orders of the defined types were placed in this encounter.   Signed, Tina Him, MD  11/23/2019 1:07 PM    [place]

## 2019-11-23 ENCOUNTER — Encounter: Payer: Self-pay | Admitting: Cardiology

## 2019-11-23 ENCOUNTER — Ambulatory Visit: Payer: PPO | Admitting: Cardiology

## 2019-11-23 ENCOUNTER — Other Ambulatory Visit: Payer: Self-pay

## 2019-11-23 VITALS — BP 130/78 | HR 78 | Ht 66.0 in | Wt 245.8 lb

## 2019-11-23 DIAGNOSIS — I471 Supraventricular tachycardia: Secondary | ICD-10-CM | POA: Diagnosis not present

## 2019-11-23 DIAGNOSIS — I77810 Thoracic aortic ectasia: Secondary | ICD-10-CM | POA: Diagnosis not present

## 2019-11-23 DIAGNOSIS — I10 Essential (primary) hypertension: Secondary | ICD-10-CM | POA: Diagnosis not present

## 2019-11-23 DIAGNOSIS — I5032 Chronic diastolic (congestive) heart failure: Secondary | ICD-10-CM | POA: Diagnosis not present

## 2019-11-23 MED ORDER — FUROSEMIDE 20 MG PO TABS: 20.0000 mg | ORAL_TABLET | Freq: Every day | ORAL | 3 refills | Status: DC

## 2019-11-23 MED ORDER — DILTIAZEM HCL ER COATED BEADS 120 MG PO CP24: ORAL_CAPSULE | ORAL | 2 refills | Status: DC

## 2019-11-23 NOTE — Patient Instructions (Signed)

## 2019-12-14 ENCOUNTER — Ambulatory Visit: Payer: PPO

## 2020-01-22 DIAGNOSIS — L281 Prurigo nodularis: Secondary | ICD-10-CM | POA: Diagnosis not present

## 2020-02-09 ENCOUNTER — Other Ambulatory Visit (INDEPENDENT_AMBULATORY_CARE_PROVIDER_SITE_OTHER): Payer: Self-pay | Admitting: Family Medicine

## 2020-02-13 DIAGNOSIS — R2232 Localized swelling, mass and lump, left upper limb: Secondary | ICD-10-CM | POA: Diagnosis not present

## 2020-02-15 DIAGNOSIS — K219 Gastro-esophageal reflux disease without esophagitis: Secondary | ICD-10-CM | POA: Diagnosis not present

## 2020-02-15 DIAGNOSIS — Z86010 Personal history of colonic polyps: Secondary | ICD-10-CM | POA: Diagnosis not present

## 2020-02-16 ENCOUNTER — Encounter: Payer: Self-pay | Admitting: Family Medicine

## 2020-02-16 ENCOUNTER — Ambulatory Visit (INDEPENDENT_AMBULATORY_CARE_PROVIDER_SITE_OTHER): Payer: PPO | Admitting: Family Medicine

## 2020-02-16 ENCOUNTER — Ambulatory Visit: Payer: Self-pay

## 2020-02-16 ENCOUNTER — Other Ambulatory Visit: Payer: Self-pay

## 2020-02-16 DIAGNOSIS — M25512 Pain in left shoulder: Secondary | ICD-10-CM

## 2020-02-16 MED ORDER — NABUMETONE 500 MG PO TABS: 500.0000 mg | ORAL_TABLET | Freq: Two times a day (BID) | ORAL | 3 refills | Status: DC | PRN

## 2020-02-16 NOTE — Progress Notes (Signed)
Pain for 3 weeks Swelling between shoulder and elbow Pain with movement

## 2020-02-16 NOTE — Progress Notes (Signed)
Office Visit Note   Patient: [name]           Date of Birth: [birth year]           MRN: [id number] Visit Date: 02/16/2020 Requested by: Haywood Pao, MD [address] [place],  [place] [address] PCP: Osborne Casco Fransico Him, MD  Subjective: Chief Complaint  Patient presents with  . Left Shoulder - Pain    HPI: Left arm pain.  Symptoms started 2 or 3 weeks ago, no injury.  Pain on the lateral aspect of her shoulder and upper arm.  She noticed some soft tissue swelling compared to the right side.  She is worried because 10 years ago she had left-sided breast cancer.  She did not have any lymphedema afterward but they did remove some lymph nodes in that area.  She has been cancer free.  Denies fevers or chills, night sweats, unintentional weight change.  No numbness or tingling in her arm.  She is not taking medication for it.              ROS:   All other systems were reviewed and are negative.  Objective: Vital Signs: There were no vitals taken for this visit.  Physical Exam:  General:  Alert and oriented, in no acute distress. Pulm:  Breathing unlabored. Psy:  Normal mood, congruent affect. Skin: No erythema Left arm: She has full range of motion of her shoulder but pain at the extremes of overhead reach.  She has palpable crepitus with active and passive range of motion in the shoulder.  She is tender in the lateral subacromial space.  She has 5/5 rotator cuff strength but pain with empty can test and with internal rotation.  There is noticeable difference in the size of the soft tissues on the lateral arm.  She does have fatty arms, but the left seems to be noticeably bigger than the right.  There is no edema in the lower arm.  Imaging: X-rays left arm: No sign of neoplasm.  She has slightly hooked acromion bone, no significant degenerative changes.  Limited diagnostic ultrasound: No images were recorded and I did not bill for the procedure.  I did not see any  fluid collections in the area of soft tissue swelling.  Assessment & Plan: 1.  Left arm pain with soft tissues swelling -We will treat as rotator cuff impingement with physical therapy and Relafen.  After a couple weeks if she is not any better, she will notify me and I will order MRI scan of her humerus.     Procedures: No procedures performed  No notes on file     PMFS History: Patient Active Problem List   Diagnosis Date Noted  . Abscess of vulva 11/15/2018  . Anxiety 11/15/2018  . Insomnia 11/15/2018  . Chronic diastolic heart failure ([place]) 04/18/2018  . Benign essential HTN 04/18/2018  . Congestive heart failure ([place]) 03/23/2018  . Mild dilation of ascending aorta (HCC)   . Elevated blood pressure reading without diagnosis of hypertension   . Abnormal EKG 02/07/2016  . Edema extremities 02/07/2016  . Joint pain 03/19/2014  . Paroxysmal supraventricular tachycardia ([place]) 12/22/2013  . Breast cancer of upper-outer quadrant of left female breast ([place]) 09/19/2013  . Vaginal atrophy 08/18/2013  . Vaginitis and vulvovaginitis 08/18/2013  . Pain, pelvic, female 08/18/2013  . Hypothyroidism, postsurgical 04/03/2013   Past Medical History:  Diagnosis Date  . Anxiety   . Arthritis    lower back  and thumbs  . Breast cancer of upper-outer quadrant of left female breast ([place]) 09/19/2013  . Cancer [facility])    left breast-(12-12-09 radiation and surgery)-Dr. Jana Hakim -oncology  . Chronic diastolic CHF (congestive heart failure) ([place])   . Colon polyps    sm aden p 10/06 (difficult exam, needs propofol);neg 07/2010  . Dyslipidemia   . Edema extremities 02/07/2016  . GERD (gastroesophageal reflux disease)    [facility] [address] [facility] neg for metaplasia '97, '08 (mod HH) 9.2011  . HTN (hypertension)   . Hypothyroidism, postsurgical 04/03/2013  . Laryngitis 12-20-12   recent episode is resolving with Amoxicillin orally  . Mild dilation of ascending aorta (HCC)   . Obesity   . Osteoporosis     . Paroxysmal supraventricular tachycardia ([place]) 12/22/2013  . Personal history of radiation therapy    2011  . PONV (postoperative nausea and vomiting)   . Thyroid adenoma 12-20-12   surgery planned 12-30-12  . Vaginal atrophy 08/18/2013  . Vaginitis and vulvovaginitis 08/18/2013    Family History  Problem Relation Age of Onset  . Stroke Mother   . Heart disease Father   . Cancer Brother        melanoma    Past Surgical History:  Procedure Laterality Date  . BREAST LUMPECTOMY Left    2011  . BREAST SURGERY     left breast lumpectomy  . EYE SURGERY  12-20-12   right eye retina tear repair  . THYROIDECTOMY N/A 12/30/2012   Procedure: THYROIDECTOMY;  Surgeon: Earnstine Regal, MD;  Location: [facility];  Service: General;  Laterality: N/A;   Social History   Occupational History  . Not on file  Tobacco Use  . Smoking status: Former Smoker    Types: Cigarettes    Quit date: 01/31/2010    Years since quitting: 10.0  . Smokeless tobacco: Never Used  Substance and Sexual Activity  . Alcohol use: Yes    Comment: social- occ  . Drug use: No  . Sexual activity: Yes    Birth control/protection: Post-menopausal

## 2020-02-20 DIAGNOSIS — M810 Age-related osteoporosis without current pathological fracture: Secondary | ICD-10-CM | POA: Diagnosis not present

## 2020-02-20 DIAGNOSIS — E038 Other specified hypothyroidism: Secondary | ICD-10-CM | POA: Diagnosis not present

## 2020-02-20 DIAGNOSIS — E7800 Pure hypercholesterolemia, unspecified: Secondary | ICD-10-CM | POA: Diagnosis not present

## 2020-02-23 DIAGNOSIS — M7542 Impingement syndrome of left shoulder: Secondary | ICD-10-CM | POA: Diagnosis not present

## 2020-02-27 DIAGNOSIS — J309 Allergic rhinitis, unspecified: Secondary | ICD-10-CM | POA: Diagnosis not present

## 2020-02-27 DIAGNOSIS — K219 Gastro-esophageal reflux disease without esophagitis: Secondary | ICD-10-CM | POA: Diagnosis not present

## 2020-02-27 DIAGNOSIS — E7800 Pure hypercholesterolemia, unspecified: Secondary | ICD-10-CM | POA: Diagnosis not present

## 2020-02-27 DIAGNOSIS — F419 Anxiety disorder, unspecified: Secondary | ICD-10-CM | POA: Diagnosis not present

## 2020-02-27 DIAGNOSIS — C50912 Malignant neoplasm of unspecified site of left female breast: Secondary | ICD-10-CM | POA: Diagnosis not present

## 2020-02-27 DIAGNOSIS — E039 Hypothyroidism, unspecified: Secondary | ICD-10-CM | POA: Diagnosis not present

## 2020-02-27 DIAGNOSIS — M810 Age-related osteoporosis without current pathological fracture: Secondary | ICD-10-CM | POA: Diagnosis not present

## 2020-02-27 DIAGNOSIS — Z0000 Encounter for general adult medical examination without abnormal findings: Secondary | ICD-10-CM | POA: Diagnosis not present

## 2020-02-27 DIAGNOSIS — R82998 Other abnormal findings in urine: Secondary | ICD-10-CM | POA: Diagnosis not present

## 2020-02-27 DIAGNOSIS — I77810 Thoracic aortic ectasia: Secondary | ICD-10-CM | POA: Diagnosis not present

## 2020-02-27 DIAGNOSIS — I519 Heart disease, unspecified: Secondary | ICD-10-CM | POA: Diagnosis not present

## 2020-03-01 DIAGNOSIS — M7542 Impingement syndrome of left shoulder: Secondary | ICD-10-CM | POA: Diagnosis not present

## 2020-03-05 DIAGNOSIS — M7542 Impingement syndrome of left shoulder: Secondary | ICD-10-CM | POA: Diagnosis not present

## 2020-03-08 DIAGNOSIS — Z1212 Encounter for screening for malignant neoplasm of rectum: Secondary | ICD-10-CM | POA: Diagnosis not present

## 2020-03-12 DIAGNOSIS — M7542 Impingement syndrome of left shoulder: Secondary | ICD-10-CM | POA: Diagnosis not present

## 2020-03-15 ENCOUNTER — Telehealth: Payer: Self-pay | Admitting: Family Medicine

## 2020-03-15 DIAGNOSIS — M25512 Pain in left shoulder: Secondary | ICD-10-CM

## 2020-03-15 NOTE — Telephone Encounter (Signed)
Patient called requesting a call back from Dr. Junius Roads or Dr. Park Liter nurse. Patient states she needs to discuss how physical therapy is going. Patient phone number is [phone].

## 2020-03-15 NOTE — Telephone Encounter (Signed)
I spoke to the patient, she has been doing physical therapy consistently and is not making any progress.  She is in quite a bit of pain, even requiring pain medicine sometimes to help her sleep.  Pain seems to be most intense in the shoulder with radiation down the arm.  We will order MRI scan of the shoulder to look for rotator cuff tear.  If negative, then ultrasound of the upper extremity per Dr. Osborne Casco.

## 2020-03-15 NOTE — Telephone Encounter (Signed)
Left a message on the patient's mobile voice mail to call back, or she can send a message through [place] - whichever is easiest for her.

## 2020-03-18 ENCOUNTER — Other Ambulatory Visit: Payer: Self-pay

## 2020-03-18 ENCOUNTER — Ambulatory Visit
Admission: RE | Admit: 2020-03-18 | Discharge: 2020-03-18 | Disposition: A | Discharge status: Home / Self Care | Payer: PPO | Source: Ambulatory Visit | Attending: Family Medicine | Admitting: Family Medicine

## 2020-03-18 ENCOUNTER — Telehealth: Payer: Self-pay | Admitting: Family Medicine

## 2020-03-18 DIAGNOSIS — M19012 Primary osteoarthritis, left shoulder: Secondary | ICD-10-CM | POA: Diagnosis not present

## 2020-03-18 DIAGNOSIS — M25512 Pain in left shoulder: Secondary | ICD-10-CM

## 2020-03-18 NOTE — Telephone Encounter (Signed)
MRI shows severe tendinopathy of the rotator cuff and biceps tendons.  No sign of stress fracture, neoplasm.

## 2020-03-18 NOTE — Telephone Encounter (Signed)
The patient has been corresponding with Dr. Junius Roads through [place] - see message with her MRI results.

## 2020-03-19 ENCOUNTER — Encounter: Payer: Self-pay | Admitting: Family Medicine

## 2020-03-19 ENCOUNTER — Ambulatory Visit (INDEPENDENT_AMBULATORY_CARE_PROVIDER_SITE_OTHER): Payer: PPO | Admitting: Family Medicine

## 2020-03-19 DIAGNOSIS — M25512 Pain in left shoulder: Secondary | ICD-10-CM | POA: Diagnosis not present

## 2020-03-19 DIAGNOSIS — R222 Localized swelling, mass and lump, trunk: Secondary | ICD-10-CM | POA: Diagnosis not present

## 2020-03-19 MED ORDER — TRAMADOL HCL 50 MG PO TABS: 50.0000 mg | ORAL_TABLET | Freq: Four times a day (QID) | ORAL | 0 refills | Status: DC | PRN

## 2020-03-19 NOTE — Progress Notes (Addendum)
Office Visit Note   Patient: [name]           Date of Birth: 1956-10-09           MRN: [id number] Visit Date: 03/19/2020 Requested by: Haywood Pao, MD [address] [place],  [place] [address] PCP: Osborne Casco Fransico Him, MD  Subjective: Chief Complaint  Patient presents with  . Left Shoulder - Pain    Subacromial cortisone injection    HPI: She is here with persistent left shoulder pain.  MRI showed severe tendinopathy of the rotator cuff and biceps tendons.  Once again, there was no injury to cause this.  She does note that she had her second COVID-19 vaccine sometime in February.  The vaccine was given into the right arm.  Also she notes that she has discovered a swollen area in the left upper chest wall.  She noticed it about a week ago.  It does not hurt, but she is concerned that it might be a fluid collection.              ROS:   All other systems were reviewed and are negative.  Objective: Vital Signs: There were no vitals taken for this visit.  Physical Exam:  General:  Alert and oriented, in no acute distress. Pulm:  Breathing unlabored. Psy:  Normal mood, congruent affect. Skin: No erythema or rash Left shoulder: Just below the [facility] joint area, in the soft tissues there is a round subcutaneous lesion measuring about 4 to 6 cm diameter.  It feels almost like a lipoma.   Imaging: MR Shoulder Left w/o contrast  Result Date: 03/18/2020 CLINICAL DATA:  Left shoulder pain for 2 months.  No known injury. EXAM: MRI OF THE LEFT SHOULDER WITHOUT CONTRAST TECHNIQUE: Multiplanar, multisequence MR imaging of the shoulder was performed. No intravenous contrast was administered. COMPARISON:  Plain films left shoulder 02/16/2020. FINDINGS: Rotator cuff: Intact. Heterogeneously increased T2 signal is seen in all the rotator cuff tendons consistent with tendinopathy. Muscles:  No atrophy or focal lesion. Biceps long head: Severe tendinopathy of the intra-articular  segment is identified. Acromioclavicular Joint: Mild osteoarthritis. Type 2 acromion. The patient has a meso-acromion type os acromiale. There is subchondral edema and cyst formation about the synchondrosis consistent with degenerative disease. Glenohumeral Joint: Normal in appearance. Small joint effusion noted. Labrum:  Blunting of the superior labrum is identified.  No tear. Bones:  No fracture, stress change or focal lesion. Other: None. IMPRESSION: Severe tendinopathy of the intra-articular long head of biceps without tear. Moderate to moderately severe rotator cuff tendinopathy without tear is also identified. Meso-acromion type os acromiale with degenerative disease about the synchondrosis. The patient has mild acromioclavicular osteoarthritis. Electronically Signed   By: Inge Rise M.D.   On: 03/18/2020 10:12    Assessment & Plan: 1.  Left shoulder rotator cuff and biceps tendinopathy, etiology uncertain.  Question whether it might be related to COVID-19 vaccine. -We will inject the subacromial space today.  Tramadol as needed.  If symptoms persist could contemplate glenohumeral injection instead.  2.  Left upper chest wall nodule, etiology uncertain. -She will contact me in a week if it has not resolved.  At that point I will order ultrasound per the breast center to further evaluate.     Procedures: Left shoulder subacromial injection: After sterile prep with Betadine, injected 3 cc 1% lidocaine without epinephrine and 40 mg methylprednisolone from posterior approach into the subacromial space.    PMFS History:  Patient Active Problem List   Diagnosis Date Noted  . Abscess of vulva 11/15/2018  . Anxiety 11/15/2018  . Insomnia 11/15/2018  . Chronic diastolic heart failure ([place]) 04/18/2018  . Benign essential HTN 04/18/2018  . Congestive heart failure ([place]) 03/23/2018  . Mild dilation of ascending aorta (HCC)   . Elevated blood pressure reading without diagnosis of  hypertension   . Abnormal EKG 02/07/2016  . Edema extremities 02/07/2016  . Joint pain 03/19/2014  . Paroxysmal supraventricular tachycardia ([place]) 12/22/2013  . Breast cancer of upper-outer quadrant of left female breast ([place]) 09/19/2013  . Vaginal atrophy 08/18/2013  . Vaginitis and vulvovaginitis 08/18/2013  . Pain, pelvic, female 08/18/2013  . Hypothyroidism, postsurgical 04/03/2013   Past Medical History:  Diagnosis Date  . Anxiety   . Arthritis    lower back and thumbs  . Breast cancer of upper-outer quadrant of left female breast ([place]) 09/19/2013  . Cancer [facility])    left breast-(12-12-09 radiation and surgery)-Dr. Jana Hakim -oncology  . Chronic diastolic CHF (congestive heart failure) ([place])   . Colon polyps    sm aden p 10/06 (difficult exam, needs propofol);neg 07/2010  . Dyslipidemia   . Edema extremities 02/07/2016  . GERD (gastroesophageal reflux disease)    [facility] [address] [facility]), neg for metaplasia '97, '08 (mod HH) 9.2011  . HTN (hypertension)   . Hypothyroidism, postsurgical 04/03/2013  . Laryngitis 12-20-12   recent episode is resolving with Amoxicillin orally  . Mild dilation of ascending aorta (HCC)   . Obesity   . Osteoporosis   . Paroxysmal supraventricular tachycardia ([place]) 12/22/2013  . Personal history of radiation therapy    2011  . PONV (postoperative nausea and vomiting)   . Thyroid adenoma 12-20-12   surgery planned 12-30-12  . Vaginal atrophy 08/18/2013  . Vaginitis and vulvovaginitis 08/18/2013    Family History  Problem Relation Age of Onset  . Stroke Mother   . Heart disease Father   . Cancer Brother        melanoma    Past Surgical History:  Procedure Laterality Date  . BREAST LUMPECTOMY Left    2011  . BREAST SURGERY     left breast lumpectomy  . EYE SURGERY  12-20-12   right eye retina tear repair  . THYROIDECTOMY N/A 12/30/2012   Procedure: THYROIDECTOMY;  Surgeon: Earnstine Regal, MD;  Location: [facility];  Service: General;  Laterality: N/A;    Social History   Occupational History  . Not on file  Tobacco Use  . Smoking status: Former Smoker    Types: Cigarettes    Quit date: 01/31/2010    Years since quitting: 10.1  . Smokeless tobacco: Never Used  Substance and Sexual Activity  . Alcohol use: Yes    Comment: social- occ  . Drug use: No  . Sexual activity: Yes    Birth control/protection: Post-menopausal

## 2020-03-20 ENCOUNTER — Encounter: Payer: Self-pay | Admitting: Family Medicine

## 2020-03-25 ENCOUNTER — Encounter: Payer: Self-pay | Admitting: Family Medicine

## 2020-03-25 DIAGNOSIS — R222 Localized swelling, mass and lump, trunk: Secondary | ICD-10-CM

## 2020-04-04 ENCOUNTER — Encounter: Payer: Self-pay | Admitting: Family Medicine

## 2020-04-04 ENCOUNTER — Ambulatory Visit
Admission: RE | Admit: 2020-04-04 | Discharge: 2020-04-04 | Disposition: A | Discharge status: Home / Self Care | Payer: PPO | Source: Ambulatory Visit | Attending: Family Medicine | Admitting: Family Medicine

## 2020-04-04 DIAGNOSIS — R222 Localized swelling, mass and lump, trunk: Secondary | ICD-10-CM

## 2020-04-05 ENCOUNTER — Telehealth: Payer: Self-pay | Admitting: Family Medicine

## 2020-04-05 DIAGNOSIS — R222 Localized swelling, mass and lump, trunk: Secondary | ICD-10-CM

## 2020-04-05 NOTE — Telephone Encounter (Signed)
Chest ultrasound favors a lipoma.  Radiologist recommends MRI scan to further evaluate, so will order.

## 2020-04-09 ENCOUNTER — Encounter: Payer: Self-pay | Admitting: Family Medicine

## 2020-04-12 ENCOUNTER — Encounter: Payer: Self-pay | Admitting: *Deleted

## 2020-04-15 ENCOUNTER — Telehealth: Payer: Self-pay | Admitting: Family Medicine

## 2020-04-15 NOTE — Telephone Encounter (Signed)
Sent order to [facility], they will contact pt to schedule appt

## 2020-04-15 NOTE — Telephone Encounter (Signed)
Patient called.   [facility] has appointments available for her MRI w/ contrast. She would like her referral to be sent there   Fax number: [phone]

## 2020-04-15 NOTE — Telephone Encounter (Signed)
See below

## 2020-04-26 ENCOUNTER — Other Ambulatory Visit: Payer: Self-pay | Admitting: Family Medicine

## 2020-04-28 ENCOUNTER — Other Ambulatory Visit: Payer: Self-pay

## 2020-04-28 ENCOUNTER — Ambulatory Visit (HOSPITAL_COMMUNITY)
Admission: RE | Admit: 2020-04-28 | Discharge: 2020-04-28 | Disposition: A | Discharge status: Home / Self Care | Payer: PPO | Source: Ambulatory Visit | Attending: Family Medicine | Admitting: Family Medicine

## 2020-04-28 DIAGNOSIS — R222 Localized swelling, mass and lump, trunk: Secondary | ICD-10-CM | POA: Diagnosis not present

## 2020-04-28 MED ORDER — GADOBUTROL 1 MMOL/ML IV SOLN
10.0000 mL | Freq: Once | INTRAVENOUS | Status: AC | PRN
  Administered 2020-04-28: 10 mL via INTRAVENOUS

## 2020-04-29 ENCOUNTER — Other Ambulatory Visit: Payer: Self-pay | Admitting: Internal Medicine

## 2020-04-29 ENCOUNTER — Telehealth: Payer: Self-pay | Admitting: Family Medicine

## 2020-04-29 DIAGNOSIS — Z1231 Encounter for screening mammogram for malignant neoplasm of breast: Secondary | ICD-10-CM

## 2020-04-29 NOTE — Telephone Encounter (Signed)
MRI shows no sign of cancer.

## 2020-04-30 DIAGNOSIS — B9689 Other specified bacterial agents as the cause of diseases classified elsewhere: Secondary | ICD-10-CM | POA: Diagnosis not present

## 2020-04-30 DIAGNOSIS — L298 Other pruritus: Secondary | ICD-10-CM | POA: Diagnosis not present

## 2020-04-30 DIAGNOSIS — L02821 Furuncle of head [any part, except face]: Secondary | ICD-10-CM | POA: Diagnosis not present

## 2020-05-02 ENCOUNTER — Ambulatory Visit (HOSPITAL_COMMUNITY): Payer: PPO

## 2020-05-09 ENCOUNTER — Other Ambulatory Visit (INDEPENDENT_AMBULATORY_CARE_PROVIDER_SITE_OTHER): Payer: Self-pay | Admitting: Family Medicine

## 2020-05-09 MED ORDER — CYCLOBENZAPRINE HCL 10 MG PO TABS
10.0000 mg | ORAL_TABLET | Freq: Three times a day (TID) | ORAL | 3 refills | Status: DC | PRN
Start: 2020-05-09 — End: 2020-12-03

## 2020-05-09 NOTE — Addendum Note (Signed)
Addended by: Hortencia Pilar on: 05/09/2020 10:20 AM   Modules accepted: Orders

## 2020-05-11 ENCOUNTER — Other Ambulatory Visit: Payer: PPO

## 2020-05-16 ENCOUNTER — Ambulatory Visit: Payer: Self-pay

## 2020-05-16 ENCOUNTER — Other Ambulatory Visit: Payer: Self-pay

## 2020-05-16 ENCOUNTER — Encounter: Payer: Self-pay | Admitting: Family Medicine

## 2020-05-16 ENCOUNTER — Ambulatory Visit (INDEPENDENT_AMBULATORY_CARE_PROVIDER_SITE_OTHER): Payer: PPO | Admitting: Family Medicine

## 2020-05-16 DIAGNOSIS — M25512 Pain in left shoulder: Secondary | ICD-10-CM

## 2020-05-16 NOTE — Progress Notes (Signed)
Office Visit Note   Patient: [name]           Date of Birth: 1955/12/24           MRN: [id number] Visit Date: 05/16/2020 Requested by: Haywood Pao, MD [address] [place],  [place] [address] PCP: Osborne Casco Fransico Him, MD  Subjective: Chief Complaint  Patient presents with  . Left Shoulder - Pain    Glenohumeral cortisone injection    HPI: She is here with persistent left shoulder pain. Actually in the past couple weeks the pain part has improved. She has developed stiffness in her shoulder. She had a history of frozen shoulder years ago prior to undergoing radiation treatment for breast cancer. The symptoms are similar now. Numbness and tingling have improved.              ROS:   All other systems were reviewed and are negative.  Objective: Vital Signs: There were no vitals taken for this visit.  Physical Exam:  General:  Alert and oriented, in no acute distress. Pulm:  Breathing unlabored. Psy:  Normal mood, congruent affect.  Left shoulder: She has adhesive capsulitis with decreased abduction of 85 degrees, external rotation of about 45 degrees and internal rotation of about 60 degrees. She has pain at the extremes.  Imaging: US Guided Needle Placement - No Linked Charges  Result Date: 05/16/2020 Ultrasound-guided left glenohumeral injection: After sterile prep with Betadine, injected 8 cc 1% lidocaine without epinephrine and 40 mg methylprednisolone using a 22-gauge spinal needle, passing the needle from posterior approach into the glenohumeral joint.   Injectate was seen filling the joint capsule.     Assessment & Plan: 1. Left shoulder adhesive capsulitis -Glenohumeral injection, ultrasound-guided, as above. She will work on range of motion exercises at home. Return as needed as long as range of motion normalizes.     Procedures: No procedures performed  No notes on file     PMFS History: Patient Active Problem List   Diagnosis Date  Noted  . Abscess of vulva 11/15/2018  . Anxiety 11/15/2018  . Insomnia 11/15/2018  . Chronic diastolic heart failure ([place]) 04/18/2018  . Benign essential HTN 04/18/2018  . Congestive heart failure ([place]) 03/23/2018  . Mild dilation of ascending aorta (HCC)   . Elevated blood pressure reading without diagnosis of hypertension   . Abnormal EKG 02/07/2016  . Edema extremities 02/07/2016  . Joint pain 03/19/2014  . Paroxysmal supraventricular tachycardia ([place]) 12/22/2013  . Breast cancer of upper-outer quadrant of left female breast ([place]) 09/19/2013  . Vaginal atrophy 08/18/2013  . Vaginitis and vulvovaginitis 08/18/2013  . Pain, pelvic, female 08/18/2013  . Hypothyroidism, postsurgical 04/03/2013   Past Medical History:  Diagnosis Date  . Anxiety   . Arthritis    lower back and thumbs  . Breast cancer of upper-outer quadrant of left female breast ([place]) 09/19/2013  . Cancer [facility])    left breast-(12-12-09 radiation and surgery)-Dr. Jana Hakim -oncology  . Chronic diastolic CHF (congestive heart failure) ([place])   . Colon polyps    sm aden p 10/06 (difficult exam, needs propofol);neg 07/2010  . Dyslipidemia   . Edema extremities 02/07/2016  . GERD (gastroesophageal reflux disease)    [facility] [address] [facility]), neg for metaplasia '97, '08 (mod HH) 9.2011  . HTN (hypertension)   . Hypothyroidism, postsurgical 04/03/2013  . Laryngitis 12-20-12   recent episode is resolving with Amoxicillin orally  . Mild dilation of ascending aorta (HCC)   . Obesity   .  Osteoporosis   . Paroxysmal supraventricular tachycardia ([place]) 12/22/2013  . Personal history of radiation therapy    2011  . PONV (postoperative nausea and vomiting)   . Thyroid adenoma 12-20-12   surgery planned 12-30-12  . Vaginal atrophy 08/18/2013  . Vaginitis and vulvovaginitis 08/18/2013    Family History  Problem Relation Age of Onset  . Stroke Mother   . Heart disease Father   . Cancer Brother        melanoma    Past Surgical  History:  Procedure Laterality Date  . BREAST LUMPECTOMY Left    2011  . BREAST SURGERY     left breast lumpectomy  . EYE SURGERY  12-20-12   right eye retina tear repair  . THYROIDECTOMY N/A 12/30/2012   Procedure: THYROIDECTOMY;  Surgeon: Earnstine Regal, MD;  Location: [facility];  Service: General;  Laterality: N/A;   Social History   Occupational History  . Not on file  Tobacco Use  . Smoking status: Former Smoker    Types: Cigarettes    Quit date: 01/31/2010    Years since quitting: 10.2  . Smokeless tobacco: Never Used  Vaping Use  . Vaping Use: Never used  Substance and Sexual Activity  . Alcohol use: Yes    Comment: social- occ  . Drug use: No  . Sexual activity: Yes    Birth control/protection: Post-menopausal

## 2020-05-27 ENCOUNTER — Encounter: Payer: Self-pay | Admitting: Family Medicine

## 2020-05-27 DIAGNOSIS — M25512 Pain in left shoulder: Secondary | ICD-10-CM

## 2020-05-27 NOTE — Addendum Note (Signed)
Addended by: Hortencia Pilar on: 05/27/2020 04:57 PM   Modules accepted: Orders

## 2020-05-29 ENCOUNTER — Encounter: Payer: Self-pay | Admitting: Family Medicine

## 2020-05-29 ENCOUNTER — Ambulatory Visit: Payer: PPO

## 2020-05-29 ENCOUNTER — Telehealth: Payer: Self-pay | Admitting: Family Medicine

## 2020-05-29 DIAGNOSIS — M25512 Pain in left shoulder: Secondary | ICD-10-CM

## 2020-05-29 NOTE — Telephone Encounter (Signed)
Please advise 

## 2020-05-29 NOTE — Telephone Encounter (Signed)
The Pt facility the pt was referred to is moving to [facility] and believes the pt would benefit being sent else where due to them closing for a month and all their appts being delayed.

## 2020-05-31 DIAGNOSIS — D175 Benign lipomatous neoplasm of intra-abdominal organs: Secondary | ICD-10-CM | POA: Diagnosis not present

## 2020-05-31 DIAGNOSIS — Z1211 Encounter for screening for malignant neoplasm of colon: Secondary | ICD-10-CM | POA: Diagnosis not present

## 2020-06-04 ENCOUNTER — Ambulatory Visit
Admission: RE | Admit: 2020-06-04 | Discharge: 2020-06-04 | Disposition: A | Discharge status: Home / Self Care | Payer: PPO | Source: Ambulatory Visit | Attending: Internal Medicine | Admitting: Internal Medicine

## 2020-06-04 ENCOUNTER — Other Ambulatory Visit: Payer: Self-pay

## 2020-06-04 DIAGNOSIS — Z1231 Encounter for screening mammogram for malignant neoplasm of breast: Secondary | ICD-10-CM | POA: Diagnosis not present

## 2020-06-13 ENCOUNTER — Ambulatory Visit: Payer: PPO | Admitting: Physical Therapy

## 2020-06-17 ENCOUNTER — Ambulatory Visit: Payer: PPO | Admitting: Physical Therapy

## 2020-08-19 ENCOUNTER — Other Ambulatory Visit: Payer: Self-pay | Admitting: Physician Assistant

## 2020-08-27 DIAGNOSIS — C50912 Malignant neoplasm of unspecified site of left female breast: Secondary | ICD-10-CM | POA: Diagnosis not present

## 2020-08-27 DIAGNOSIS — Z6841 Body Mass Index (BMI) 40.0 and over, adult: Secondary | ICD-10-CM | POA: Diagnosis not present

## 2020-08-27 DIAGNOSIS — E039 Hypothyroidism, unspecified: Secondary | ICD-10-CM | POA: Diagnosis not present

## 2020-08-27 DIAGNOSIS — I519 Heart disease, unspecified: Secondary | ICD-10-CM | POA: Diagnosis not present

## 2020-08-27 DIAGNOSIS — Z23 Encounter for immunization: Secondary | ICD-10-CM | POA: Diagnosis not present

## 2020-08-27 DIAGNOSIS — I471 Supraventricular tachycardia: Secondary | ICD-10-CM | POA: Diagnosis not present

## 2020-08-27 DIAGNOSIS — M810 Age-related osteoporosis without current pathological fracture: Secondary | ICD-10-CM | POA: Diagnosis not present

## 2020-08-27 DIAGNOSIS — E041 Nontoxic single thyroid nodule: Secondary | ICD-10-CM | POA: Diagnosis not present

## 2020-08-27 DIAGNOSIS — E7800 Pure hypercholesterolemia, unspecified: Secondary | ICD-10-CM | POA: Diagnosis not present

## 2020-08-27 DIAGNOSIS — I77810 Thoracic aortic ectasia: Secondary | ICD-10-CM | POA: Diagnosis not present

## 2020-08-27 DIAGNOSIS — F419 Anxiety disorder, unspecified: Secondary | ICD-10-CM | POA: Diagnosis not present

## 2020-09-02 DIAGNOSIS — M264 Malocclusion, unspecified: Secondary | ICD-10-CM | POA: Diagnosis not present

## 2020-09-02 DIAGNOSIS — K1321 Leukoplakia of oral mucosa, including tongue: Secondary | ICD-10-CM | POA: Diagnosis not present

## 2020-11-17 ENCOUNTER — Other Ambulatory Visit: Payer: Self-pay | Admitting: Cardiology

## 2020-11-18 IMAGING — MR MR SHOULDER*L* W/O CM
4 of 5 series · 30 of 40 positions shown · non-contrast
Comparison: Plain films left shoulder 02/16/2020.

CLINICAL DATA: Left shoulder pain for 2 months.  No known injury.

EXAM:
MRI OF THE LEFT SHOULDER WITHOUT CONTRAST
TECHNIQUE: Multiplanar, multisequence MR imaging of the shoulder was performed.
No intravenous contrast was administered.

[Series 6: PD fat-sat · axial · left · 4.0mm · 0.55mm/px · z∈[-61,+55]mm · 8 of 25 slices shown (1 of 2)]
[im 1/25]
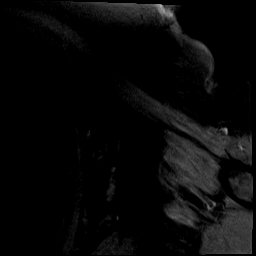
[im 3/25]
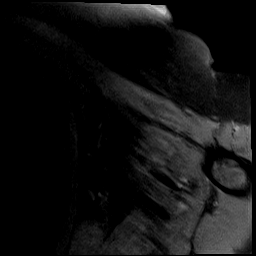
[im 9/25]
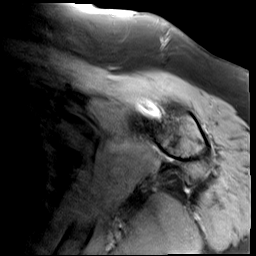
[im 11/25]
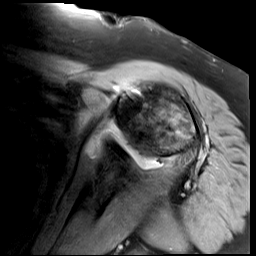
[im 14/25]
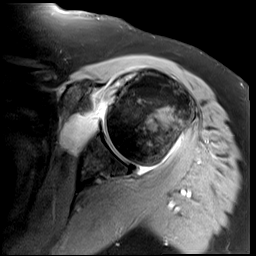
[im 17/25]
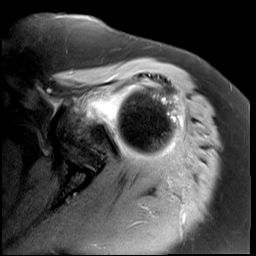
[im 22/25]
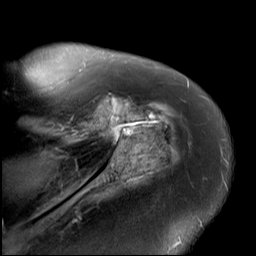
[im 25/25]
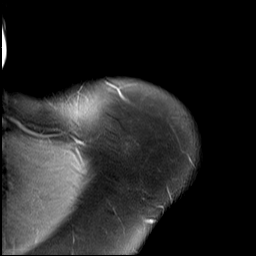

[Series 7: T2 fat-sat · oblique · left · 4.0mm · 0.27mm/px · 7 of 21 slices shown (1 of 2)]
[im 1/21]
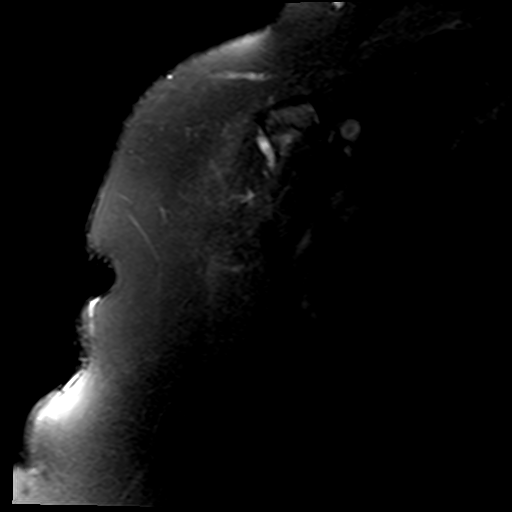
[im 4/21]
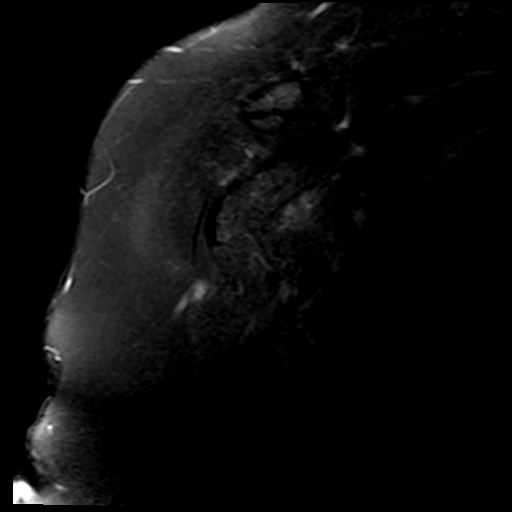
[im 7/21]
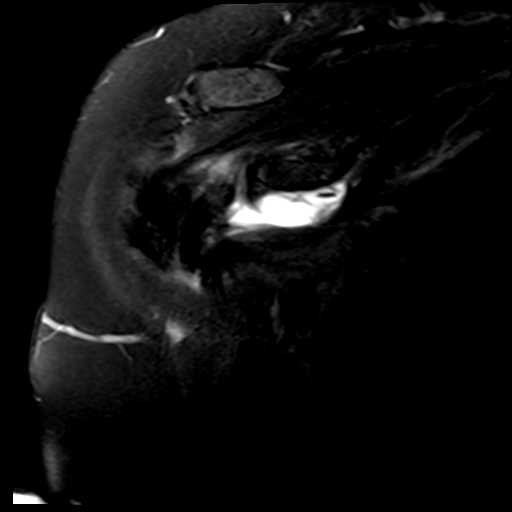
[im 11/21]
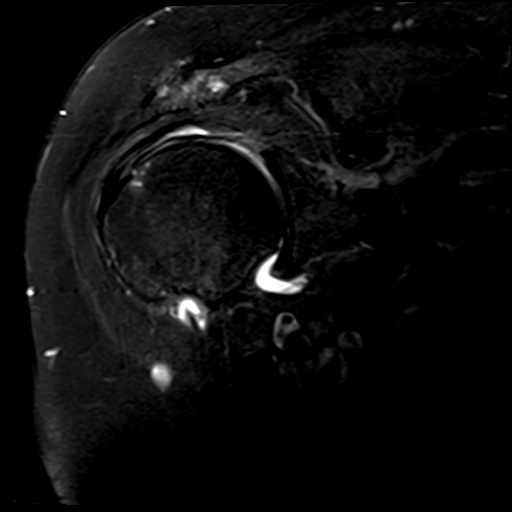
[im 14/21]
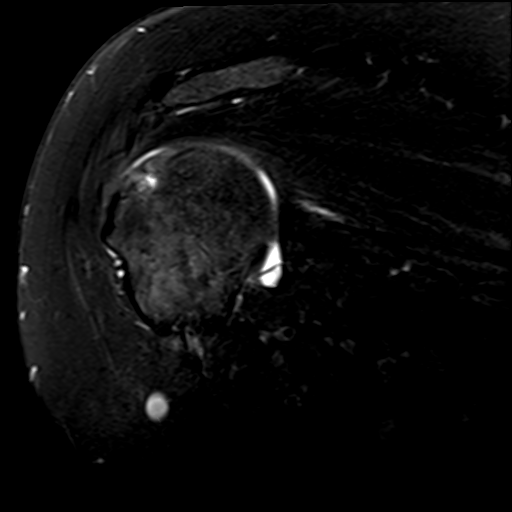
[im 17/21]
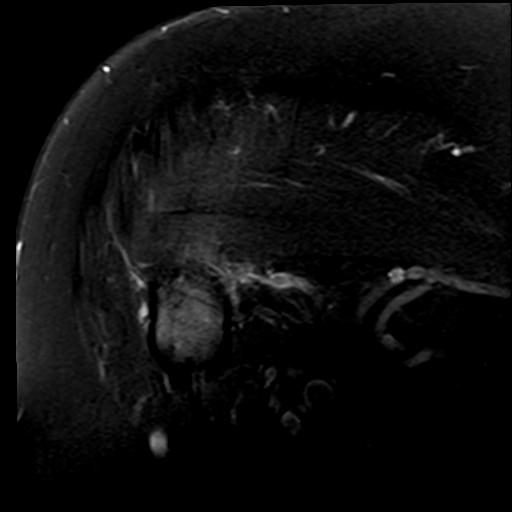
[im 21/21]
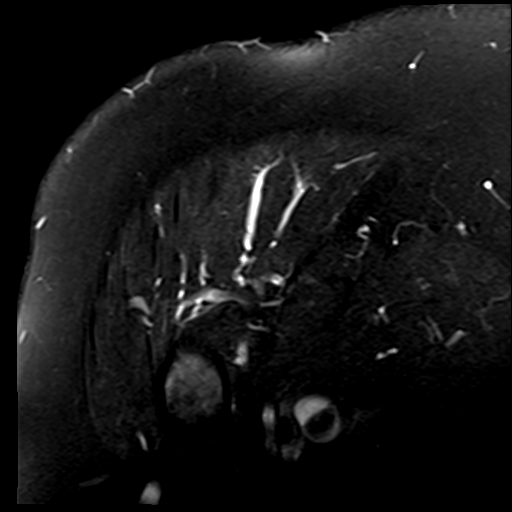

[Series 8: PD fat-sat · oblique · left · 4.0mm · 0.27mm/px · 7 of 21 slices shown (2 of 2)]
[im 1/21]
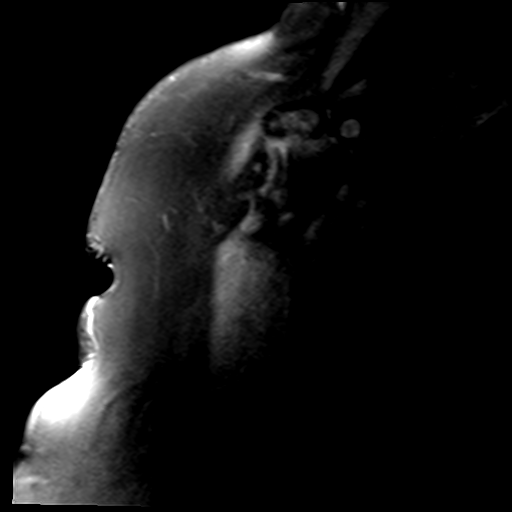
[im 4/21]
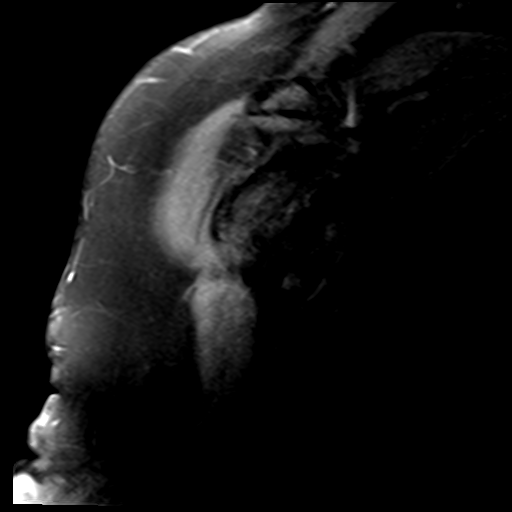
[im 7/21]
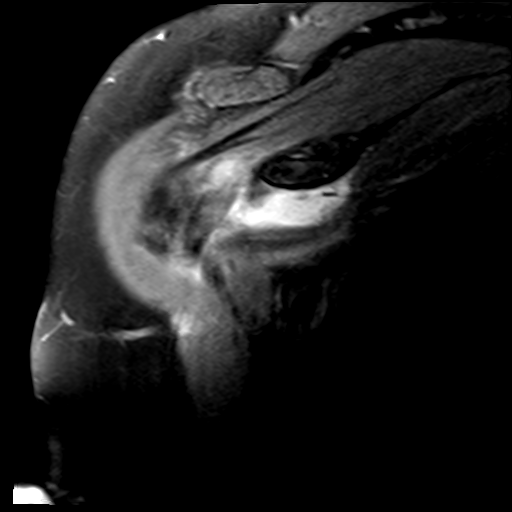
[im 11/21]
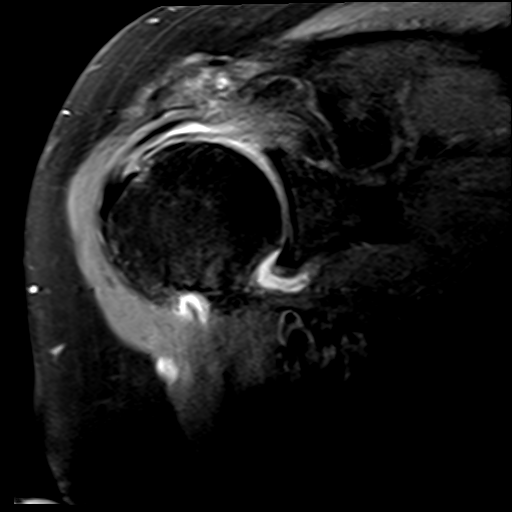
[im 14/21]
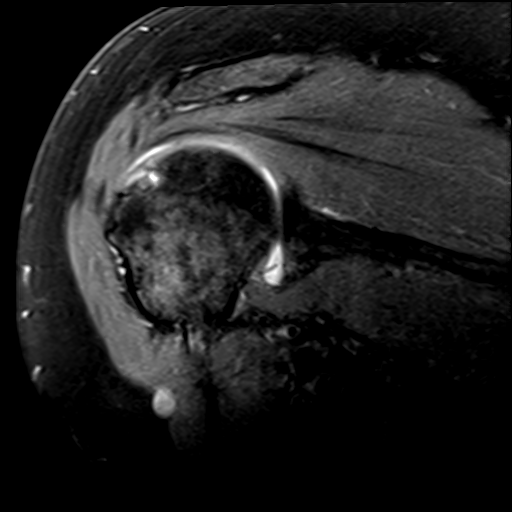
[im 17/21]
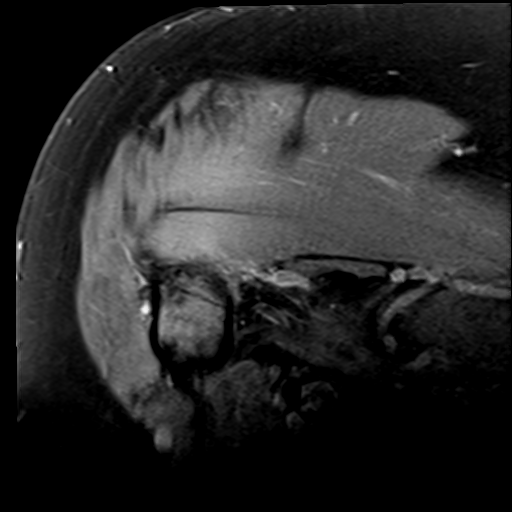
[im 21/21]
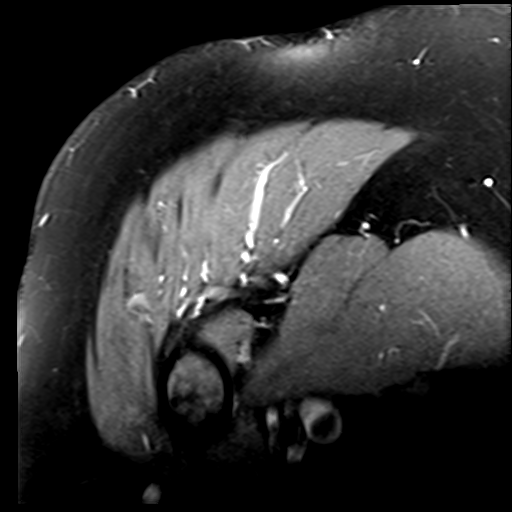

[Series 9: T2 fat-sat · oblique · left · 4.0mm · 0.55mm/px · 8 of 23 slices shown (2 of 2)]
[im 1/23]
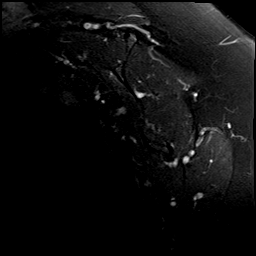
[im 4/23]
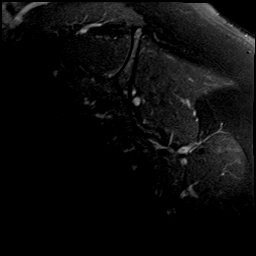
[im 7/23]
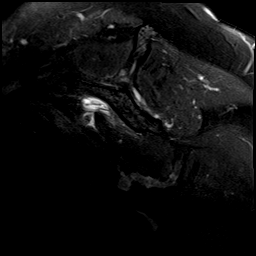
[im 10/23]
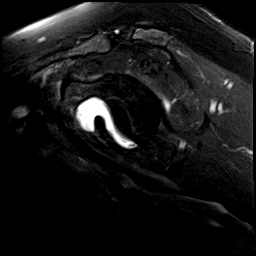
[im 13/23]
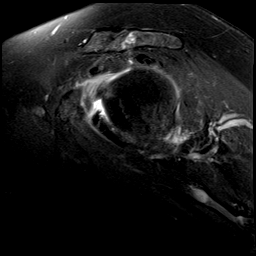
[im 16/23]
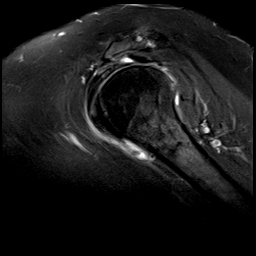
[im 19/23]
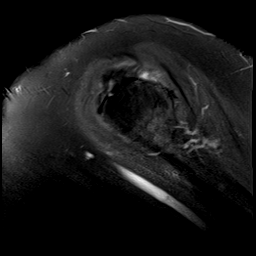
[im 23/23]
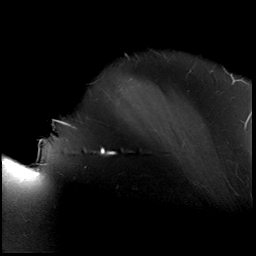

[30 of 40 positions shown; findings below may reference images not displayed]

FINDINGS: Rotator cuff: Intact. Heterogeneously increased T2 signal is seen in
all the rotator cuff tendons consistent with tendinopathy.

Muscles:  No atrophy or focal lesion.

Biceps long head: Severe tendinopathy of the intra-articular segment
is identified.

Acromioclavicular Joint: Mild osteoarthritis. Type 2 acromion. The
patient has a meso-acromion type os acromiale. There is subchondral
edema and cyst formation about the synchondrosis consistent with
degenerative disease.

Glenohumeral Joint: Normal in appearance. Small joint effusion
noted.

Labrum:  Blunting of the superior labrum is identified.  No tear.

Bones:  No fracture, stress change or focal lesion.

Other: None.
IMPRESSION: Severe tendinopathy of the intra-articular long head of biceps
without tear. Moderate to moderately severe rotator cuff
tendinopathy without tear is also identified.

Meso-acromion type os acromiale with degenerative disease about the
synchondrosis. The patient has mild acromioclavicular
osteoarthritis.

## 2020-11-18 MED ORDER — DILTIAZEM HCL ER COATED BEADS 120 MG PO CP24: ORAL_CAPSULE | ORAL | 0 refills | Status: DC

## 2020-11-21 ENCOUNTER — Ambulatory Visit: Payer: PPO | Admitting: Cardiology

## 2020-12-03 ENCOUNTER — Other Ambulatory Visit: Payer: Self-pay | Admitting: Family Medicine

## 2020-12-03 ENCOUNTER — Other Ambulatory Visit: Payer: Self-pay | Admitting: Cardiology

## 2020-12-11 ENCOUNTER — Encounter: Payer: Self-pay | Admitting: Cardiology

## 2020-12-11 ENCOUNTER — Other Ambulatory Visit: Payer: Self-pay

## 2020-12-11 ENCOUNTER — Telehealth (INDEPENDENT_AMBULATORY_CARE_PROVIDER_SITE_OTHER): Payer: PPO | Admitting: Cardiology

## 2020-12-11 VITALS — BP 115/71 | HR 62 | Ht 66.0 in | Wt 232.0 lb

## 2020-12-11 DIAGNOSIS — I77810 Thoracic aortic ectasia: Secondary | ICD-10-CM

## 2020-12-11 DIAGNOSIS — I10 Essential (primary) hypertension: Secondary | ICD-10-CM

## 2020-12-11 DIAGNOSIS — I5032 Chronic diastolic (congestive) heart failure: Secondary | ICD-10-CM | POA: Diagnosis not present

## 2020-12-11 DIAGNOSIS — I471 Supraventricular tachycardia: Secondary | ICD-10-CM | POA: Diagnosis not present

## 2020-12-11 NOTE — Progress Notes (Signed)
Virtual Visit via Video Note   This visit type was conducted due to national recommendations for restrictions regarding the COVID-19 Pandemic (e.g. social distancing) in an effort to limit this patient's exposure and mitigate transmission in our community.  Due to her co-morbid illnesses, this patient is at least at moderate risk for complications without adequate follow up.  This format is felt to be most appropriate for this patient at this time.  All issues noted in this document were discussed and addressed.  A limited physical exam was performed with this format.  Please refer to the patient's chart for her consent to telehealth for [facility].  Date:  12/11/2020   ID:  [name], DOB December 10, 1955, MRN [id number] The patient was identified using 2 identifiers.  Patient Location: Home Provider Location: Home Office  PCP:  Tisovec, Fransico Him, MD  Cardiologist:  Fransico Him, MD  Electrophysiologist:  None   Evaluation Performed:  Follow-Up Visit  Chief Complaint:  SVT, HTN, dilated aorta, CHF  History of Present Illness:    [name] Medico is a 65 y.o. female with a hx of PSVT, mildly dilated ascending aorta, thyroid adenoma, remote breast CA, former tobacco abuse, dyslipidemia (followed by PCP), GERD,recent mild HTN,obesity, arthritis, Barrett's esophagus who presents for f/u dyspnea.Her last echo on 09/16/2017 showed normal LV function with EF 60 to 65% with grade 2 diastolic dysfunction, mildly dilated ascending aorta at 38 mm.  She is here today for followup and is doing well.  She denies any chest pain or pressure, SOB, DOE(except when walking up hills), PND, orthopnea,  dizziness, palpitations or syncope. She has chronic LE edema that is controlled on diuretics and compression hose. She is compliant with her meds and is tolerating meds with no SE.    The patient does not have symptoms concerning for COVID-19 infection (fever, chills, cough, or  new shortness of breath).    Past Medical History:  Diagnosis Date  . Anxiety   . Arthritis    lower back and thumbs  . Breast cancer of upper-outer quadrant of left female breast ([place]) 09/19/2013  . Cancer [facility])    left breast-(12-12-09 radiation and surgery)-Dr. Jana Hakim -oncology  . Chronic diastolic CHF (congestive heart failure) ([place])   . Colon polyps    sm aden p 10/06 (difficult exam, needs propofol);neg 07/2010  . Dyslipidemia   . Edema extremities 02/07/2016  . GERD (gastroesophageal reflux disease)    Barrett's [facility]), neg for metaplasia '97, '08 (mod HH) 9.2011  . HTN (hypertension)   . Hypothyroidism, postsurgical 04/03/2013  . Laryngitis 12-20-12   recent episode is resolving with Amoxicillin orally  . Mild dilation of ascending aorta ([facility])   . Obesity   . Osteoporosis   . Paroxysmal supraventricular tachycardia ([place]) 12/22/2013  . Personal history of radiation therapy    2011  . PONV (postoperative nausea and vomiting)   . Thyroid adenoma 12-20-12   surgery planned 12-30-12  . Vaginal atrophy 08/18/2013  . Vaginitis and vulvovaginitis 08/18/2013   Past Surgical History:  Procedure Laterality Date  . BREAST LUMPECTOMY Left    2011  . BREAST SURGERY     left breast lumpectomy  . EYE SURGERY  12-20-12   right eye retina tear repair  . THYROIDECTOMY N/A 12/30/2012   Procedure: THYROIDECTOMY;  Surgeon: Earnstine Regal, MD;  Location: [facility] ORS;  Service: General;  Laterality: N/A;     Current Meds  Medication Sig  .  alendronate (FOSAMAX) 70 MG tablet Take 70 mg by mouth once a week.  . ALPRAZolam (XANAX) 1 MG tablet Take 1 mg by mouth at bedtime as needed.  Marland Kitchen aspirin EC 81 MG tablet Take 81 mg by mouth every other day. Sundays and Thursdays  . Calcium-Vitamin D-Vitamin K (VIACTIV CALCIUM PLUS D) 650-12.5-40 MG-MCG-MCG CHEW Chew by mouth.  . clobetasol (OLUX) 0.05 % topical foam APPLY TOPICALLY TO AFFECTED AREA SCALP UP TO TWICE A DAY AS NEEDED ( NOT TO FACE GROIN  UNDERARM)  . Coenzyme Q10 (CO Q 10 PO) Take 20 mg by mouth as directed.  . cyclobenzaprine (FLEXERIL) 10 MG tablet TAKE 1 TABLET BY MOUTH AT BEDTIME AS NEEDED FOR MUSCLE SPASM  . diltiazem (CARTIA XT) 120 MG 24 hr capsule Take 1 capsule by mouth in the morning. Please keep upcoming appt with Dr. Radford Pax for future refills. Thank you  . doxycycline (VIBRAMYCIN) 100 MG capsule Take 100 mg by mouth 2 (two) times daily.  Marland Kitchen ELDERBERRY PO Take by mouth. With zinc. 2 chewables daily  . fluticasone (CUTIVATE) 0.05 % cream APPLY CREAM TOPICALLY TO AFFECTED AREA UP TO TWICE DAILY AS NEEDED  . furosemide (LASIX) 20 MG tablet TAKE 1 TABLET BY MOUTH ONCE DAILY . APPOINTMENT REQUIRED FOR FUTURE REFILLS  . hydrOXYzine (VISTARIL) 25 MG capsule Take 1 capsule by mouth daily as needed. itching  . ketoconazole (NIZORAL) 2 % cream APPLY TOPICALLY TO AFFECTED AREA UP TO TWICE DAILY AS NEEDED  . levothyroxine (SYNTHROID, LEVOTHROID) 137 MCG tablet Take 274 mcg by mouth daily before breakfast.  . Multiple Vitamin (MULTIVITAMIN) tablet Take 1 tablet by mouth daily.  . nabumetone (RELAFEN) 500 MG tablet Take 1 tablet by mouth twice daily as needed  . Omega-3 Fatty Acids (FISH OIL) 1200 MG CAPS Take 1 capsule daily by mouth.  Marland Kitchen omeprazole (PRILOSEC) 40 MG capsule Take 40 mg by mouth daily.  . pantoprazole (PROTONIX) 40 MG tablet Take 1 tablet (40 mg total) daily by mouth.  . pravastatin (PRAVACHOL) 40 MG tablet Take 40 mg by mouth daily.  . sertraline (ZOLOFT) 100 MG tablet Take 150 mg by mouth daily.  . sodium chloride (OCEAN) 0.65 % SOLN nasal spray Place 1 spray into the nose at bedtime.  Marland Kitchen tiZANidine (ZANAFLEX) 2 MG tablet TAKE 1 TO 2 TABLETS BY MOUTH EVERY 6 HOURS AS NEEDED FOR MUSCLE SPASM  . traMADol (ULTRAM) 50 MG tablet Take 1-2 tablets (50-100 mg total) by mouth every 6 (six) hours as needed.  . Vitamin D-Vitamin K (VITAMIN K2-VITAMIN D3 PO) Take by mouth.     Allergies:   Sulfa antibiotics, Sulfa drugs cross  reactors, and Venlafaxine   Social History   Tobacco Use  . Smoking status: Former Smoker    Types: Cigarettes    Quit date: 01/31/2010    Years since quitting: 10.8  . Smokeless tobacco: Never Used  Vaping Use  . Vaping Use: Never used  Substance Use Topics  . Alcohol use: Yes    Comment: social- occ  . Drug use: No     Family Hx: The patient's family history includes Cancer in her brother; Heart disease in her father; Stroke in her mother.  ROS:   Please see the history of present illness.     All other systems reviewed and are negative.   Prior CV studies:   The following studies were reviewed today:  none  Labs/Other Tests and Data Reviewed:    EKG:  No ECG  reviewed.  Recent Labs: No results found for requested labs within last 8760 hours.   Recent Lipid Panel No results found for: CHOL, TRIG, HDL, CHOLHDL, LDLCALC, LDLDIRECT  Wt Readings from Last 3 Encounters:  12/11/20 232 lb (105.2 kg)  11/23/19 245 lb 12.8 oz (111.5 kg)  05/22/19 242 lb (109.8 kg)     Risk Assessment/Calculations:      Objective:    Vital Signs:  BP 115/71   Pulse 62   Ht 5\' 6"  (1.676 m)   Wt 232 lb (105.2 kg)   BMI 37.45 kg/m    VITAL SIGNS:  reviewed GEN:  no acute distress EYES:  sclerae anicteric, EOMI - Extraocular Movements Intact RESPIRATORY:  normal respiratory effort, symmetric expansion CARDIOVASCULAR:  no peripheral edema SKIN:  no rash, lesions or ulcers. MUSCULOSKELETAL:  no obvious deformities. NEURO:  alert and oriented x 3, no obvious focal deficit PSYCH:  normal affect  ASSESSMENT & PLAN:    1.  PSVT -she has not had any palpitations since I was her last -continue Cardizem CD 120mg  daily  2.  Dilated ascending aorta -2D echo with normal aortic root and ascending aortic diameter  3.  Chronic diastolic CHF -her weight is stable and she has actually loast 13lbs since I saw her last and I congratulated her on success at weight loss -she denies any  SOB and LE edema is stable on diuretics and compression hose -continue Lasix 20mg  daily -she has lab work scheduled for her yearly PE with PCP  4.  HTN -BP is well controlled on exam today -continue on Cardizem CD 120mg  daily  COVID-19 Education: The signs and symptoms of COVID-19 were discussed with the patient and how to seek care for testing (follow up with PCP or arrange E-visit).  The importance of social distancing was discussed today.  Time:   Today, I have spent 20 minutes with the patient with telehealth technology discussing the above problems.     Medication Adjustments/Labs and Tests Ordered: Current medicines are reviewed at length with the patient today.  Concerns regarding medicines are outlined above.   Tests Ordered: No orders of the defined types were placed in this encounter.   Medication Changes: No orders of the defined types were placed in this encounter.   Follow Up:  In Person in 1 year(s)  Signed, Fransico Him, MD  12/11/2020 8:18 AM    [place]

## 2020-12-11 NOTE — Patient Instructions (Signed)

## 2020-12-30 DIAGNOSIS — H9202 Otalgia, left ear: Secondary | ICD-10-CM | POA: Diagnosis not present

## 2020-12-30 DIAGNOSIS — R202 Paresthesia of skin: Secondary | ICD-10-CM | POA: Diagnosis not present

## 2021-01-14 DIAGNOSIS — D1030 Benign neoplasm of unspecified part of mouth: Secondary | ICD-10-CM | POA: Diagnosis not present

## 2021-01-14 DIAGNOSIS — H9202 Otalgia, left ear: Secondary | ICD-10-CM | POA: Insufficient documentation

## 2021-01-14 DIAGNOSIS — M264 Malocclusion, unspecified: Secondary | ICD-10-CM | POA: Diagnosis not present

## 2021-01-14 DIAGNOSIS — M26609 Unspecified temporomandibular joint disorder, unspecified side: Secondary | ICD-10-CM | POA: Insufficient documentation

## 2021-01-22 DIAGNOSIS — D093 Carcinoma in situ of thyroid and other endocrine glands: Secondary | ICD-10-CM | POA: Diagnosis not present

## 2021-01-22 DIAGNOSIS — H52223 Regular astigmatism, bilateral: Secondary | ICD-10-CM | POA: Diagnosis not present

## 2021-01-22 DIAGNOSIS — H59811 Chorioretinal scars after surgery for detachment, right eye: Secondary | ICD-10-CM | POA: Diagnosis not present

## 2021-01-22 DIAGNOSIS — H524 Presbyopia: Secondary | ICD-10-CM | POA: Diagnosis not present

## 2021-01-22 DIAGNOSIS — H5203 Hypermetropia, bilateral: Secondary | ICD-10-CM | POA: Diagnosis not present

## 2021-01-22 DIAGNOSIS — H33001 Unspecified retinal detachment with retinal break, right eye: Secondary | ICD-10-CM | POA: Diagnosis not present

## 2021-02-16 ENCOUNTER — Other Ambulatory Visit: Payer: Self-pay | Admitting: Cardiology

## 2021-02-21 ENCOUNTER — Other Ambulatory Visit: Payer: Self-pay | Admitting: Cardiology

## 2021-02-21 DIAGNOSIS — E039 Hypothyroidism, unspecified: Secondary | ICD-10-CM | POA: Diagnosis not present

## 2021-02-21 DIAGNOSIS — M810 Age-related osteoporosis without current pathological fracture: Secondary | ICD-10-CM | POA: Diagnosis not present

## 2021-02-21 DIAGNOSIS — E7800 Pure hypercholesterolemia, unspecified: Secondary | ICD-10-CM | POA: Diagnosis not present

## 2021-02-28 DIAGNOSIS — Z6841 Body Mass Index (BMI) 40.0 and over, adult: Secondary | ICD-10-CM | POA: Diagnosis not present

## 2021-02-28 DIAGNOSIS — I77810 Thoracic aortic ectasia: Secondary | ICD-10-CM | POA: Diagnosis not present

## 2021-02-28 DIAGNOSIS — F419 Anxiety disorder, unspecified: Secondary | ICD-10-CM | POA: Diagnosis not present

## 2021-02-28 DIAGNOSIS — Z0000 Encounter for general adult medical examination without abnormal findings: Secondary | ICD-10-CM | POA: Diagnosis not present

## 2021-02-28 DIAGNOSIS — E7800 Pure hypercholesterolemia, unspecified: Secondary | ICD-10-CM | POA: Diagnosis not present

## 2021-02-28 DIAGNOSIS — R82998 Other abnormal findings in urine: Secondary | ICD-10-CM | POA: Diagnosis not present

## 2021-02-28 DIAGNOSIS — E041 Nontoxic single thyroid nodule: Secondary | ICD-10-CM | POA: Diagnosis not present

## 2021-02-28 DIAGNOSIS — K219 Gastro-esophageal reflux disease without esophagitis: Secondary | ICD-10-CM | POA: Diagnosis not present

## 2021-02-28 DIAGNOSIS — E039 Hypothyroidism, unspecified: Secondary | ICD-10-CM | POA: Diagnosis not present

## 2021-02-28 DIAGNOSIS — I519 Heart disease, unspecified: Secondary | ICD-10-CM | POA: Diagnosis not present

## 2021-02-28 DIAGNOSIS — I471 Supraventricular tachycardia: Secondary | ICD-10-CM | POA: Diagnosis not present

## 2021-02-28 DIAGNOSIS — C50912 Malignant neoplasm of unspecified site of left female breast: Secondary | ICD-10-CM | POA: Diagnosis not present

## 2021-03-27 DIAGNOSIS — Z0000 Encounter for general adult medical examination without abnormal findings: Secondary | ICD-10-CM | POA: Diagnosis not present

## 2021-03-27 DIAGNOSIS — E7800 Pure hypercholesterolemia, unspecified: Secondary | ICD-10-CM | POA: Diagnosis not present

## 2021-03-27 DIAGNOSIS — K219 Gastro-esophageal reflux disease without esophagitis: Secondary | ICD-10-CM | POA: Diagnosis not present

## 2021-03-27 DIAGNOSIS — R7989 Other specified abnormal findings of blood chemistry: Secondary | ICD-10-CM | POA: Diagnosis not present

## 2021-03-27 DIAGNOSIS — E039 Hypothyroidism, unspecified: Secondary | ICD-10-CM | POA: Diagnosis not present

## 2021-04-22 ENCOUNTER — Other Ambulatory Visit: Payer: Self-pay | Admitting: Internal Medicine

## 2021-04-22 DIAGNOSIS — Z1231 Encounter for screening mammogram for malignant neoplasm of breast: Secondary | ICD-10-CM

## 2021-04-28 DIAGNOSIS — Z1152 Encounter for screening for COVID-19: Secondary | ICD-10-CM | POA: Diagnosis not present

## 2021-04-28 DIAGNOSIS — R0981 Nasal congestion: Secondary | ICD-10-CM | POA: Diagnosis not present

## 2021-04-28 DIAGNOSIS — H8110 Benign paroxysmal vertigo, unspecified ear: Secondary | ICD-10-CM | POA: Diagnosis not present

## 2021-04-28 DIAGNOSIS — R42 Dizziness and giddiness: Secondary | ICD-10-CM | POA: Diagnosis not present

## 2021-05-02 ENCOUNTER — Telehealth: Payer: Self-pay | Admitting: Cardiology

## 2021-05-08 ENCOUNTER — Ambulatory Visit: Payer: PPO | Admitting: Cardiology

## 2021-05-09 DIAGNOSIS — E039 Hypothyroidism, unspecified: Secondary | ICD-10-CM | POA: Diagnosis not present

## 2021-05-14 DIAGNOSIS — R42 Dizziness and giddiness: Secondary | ICD-10-CM | POA: Diagnosis not present

## 2021-05-14 DIAGNOSIS — E039 Hypothyroidism, unspecified: Secondary | ICD-10-CM | POA: Diagnosis not present

## 2021-05-16 DIAGNOSIS — H8111 Benign paroxysmal vertigo, right ear: Secondary | ICD-10-CM | POA: Diagnosis not present

## 2021-05-16 DIAGNOSIS — R42 Dizziness and giddiness: Secondary | ICD-10-CM | POA: Diagnosis not present

## 2021-05-20 ENCOUNTER — Telehealth: Payer: Self-pay | Admitting: Cardiology

## 2021-05-20 DIAGNOSIS — R42 Dizziness and giddiness: Secondary | ICD-10-CM | POA: Diagnosis not present

## 2021-05-20 DIAGNOSIS — I471 Supraventricular tachycardia: Secondary | ICD-10-CM

## 2021-05-20 DIAGNOSIS — H8111 Benign paroxysmal vertigo, right ear: Secondary | ICD-10-CM | POA: Diagnosis not present

## 2021-05-20 NOTE — Telephone Encounter (Signed)
Spoke with the patient who states she has been concerned about having low heart rates recently. She states that her normal heart rate is 60s-70s. Recently it has been running in the low 50s. She also states that she had an episode of SVT over the weekend. She states that it was the first time that she has had an episode in while. She is not sure what her heart rate was at the time. She was lying in bed and felt it come on. She states that she took deep breath and lied on her back and it settled down quickly. She then rolled over to her right sie and had an episode of vertigo.  Her heart rate also spiked the following day after getting up from her desk it went from 52 to 106. She did not have any symptoms at the time. Confirms that she is taking diltiazem 120 mg daily and lasix 20 mg daily.  She has been having multiple issues with dizziness, headaches, and inner problems recently. She has been seeing her PCP and has been doing Epley maneuvers. She also states that her Synthroid has been adjusted multiple times in the past couple of months. She is unsure what symptoms are related to what.  Patient is mainly concerned about heart rates dropping so low.  Patient has a follow up with Laurann Montana on 8/19.  Encouraged patient to stay hydrated, change positions slowly and continue monitoring symptoms.

## 2021-05-20 NOTE — Telephone Encounter (Signed)
  Per patient schedule request MyChart message:  STAT if HR is under 50 or over 120 (normal HR is 60-100 beats per minute)  What is your heart rate? 53, 52, 64  Do you have a log of your heart rate readings (document readings)? yes  Do you have any other symptoms?  Had a SVT event Saturday night for about 5 seconds, first time in quite a while, headaches, lightheadedness / vertigo for about a month recent changes to Synthroid by primary and effects on my heart

## 2021-05-21 NOTE — Telephone Encounter (Signed)
Left message for patient to call back  

## 2021-05-21 NOTE — Telephone Encounter (Signed)
Spoke with the patient and advised her that Dr. Radford Pax would like for her to wear a heart monitor. Patient verbalized understanding. Monitor has been ordered.

## 2021-05-29 ENCOUNTER — Emergency Department (HOSPITAL_BASED_OUTPATIENT_CLINIC_OR_DEPARTMENT_OTHER)
Admission: EM | Admit: 2021-05-29 | Discharge: 2021-05-29 | Disposition: A | Discharge status: Home / Self Care | Payer: PPO | Attending: Emergency Medicine | Admitting: Emergency Medicine

## 2021-05-29 ENCOUNTER — Encounter (HOSPITAL_BASED_OUTPATIENT_CLINIC_OR_DEPARTMENT_OTHER): Payer: Self-pay | Admitting: Emergency Medicine

## 2021-05-29 ENCOUNTER — Emergency Department (HOSPITAL_BASED_OUTPATIENT_CLINIC_OR_DEPARTMENT_OTHER): Payer: PPO

## 2021-05-29 ENCOUNTER — Other Ambulatory Visit: Payer: Self-pay

## 2021-05-29 ENCOUNTER — Telehealth: Payer: Self-pay | Admitting: Cardiology

## 2021-05-29 DIAGNOSIS — I110 Hypertensive heart disease with heart failure: Secondary | ICD-10-CM | POA: Diagnosis not present

## 2021-05-29 DIAGNOSIS — E039 Hypothyroidism, unspecified: Secondary | ICD-10-CM | POA: Insufficient documentation

## 2021-05-29 DIAGNOSIS — R001 Bradycardia, unspecified: Secondary | ICD-10-CM | POA: Diagnosis not present

## 2021-05-29 DIAGNOSIS — Z853 Personal history of malignant neoplasm of breast: Secondary | ICD-10-CM | POA: Insufficient documentation

## 2021-05-29 DIAGNOSIS — R0602 Shortness of breath: Secondary | ICD-10-CM | POA: Diagnosis not present

## 2021-05-29 DIAGNOSIS — Z79899 Other long term (current) drug therapy: Secondary | ICD-10-CM | POA: Insufficient documentation

## 2021-05-29 DIAGNOSIS — S0003XA Contusion of scalp, initial encounter: Secondary | ICD-10-CM | POA: Diagnosis not present

## 2021-05-29 DIAGNOSIS — R002 Palpitations: Secondary | ICD-10-CM | POA: Diagnosis not present

## 2021-05-29 DIAGNOSIS — I5032 Chronic diastolic (congestive) heart failure: Secondary | ICD-10-CM | POA: Diagnosis not present

## 2021-05-29 DIAGNOSIS — R42 Dizziness and giddiness: Secondary | ICD-10-CM

## 2021-05-29 DIAGNOSIS — Z7982 Long term (current) use of aspirin: Secondary | ICD-10-CM | POA: Diagnosis not present

## 2021-05-29 DIAGNOSIS — Z87891 Personal history of nicotine dependence: Secondary | ICD-10-CM | POA: Diagnosis not present

## 2021-05-29 LAB — CBC
HCT: 36.8 % (ref 36.0–46.0)
Hemoglobin: 11.6 g/dL — ABNORMAL LOW (ref 12.0–15.0)
MCH: 26.2 pg (ref 26.0–34.0)
MCHC: 31.5 g/dL (ref 30.0–36.0)
MCV: 83.3 fL (ref 80.0–100.0)
Platelets: 223 10*3/uL (ref 150–400)
RBC: 4.42 MIL/uL (ref 3.87–5.11)
RDW: 19.9 % — ABNORMAL HIGH (ref 11.5–15.5)
WBC: 4.4 10*3/uL (ref 4.0–10.5)
nRBC: 0 % (ref 0.0–0.2)

## 2021-05-29 LAB — BASIC METABOLIC PANEL
Anion gap: 9 (ref 5–15)
BUN: 15 mg/dL (ref 8–23)
CO2: 26 mmol/L (ref 22–32)
Calcium: 9 mg/dL (ref 8.9–10.3)
Chloride: 103 mmol/L (ref 98–111)
Creatinine, Ser: 1 mg/dL (ref 0.44–1.00)
GFR, Estimated: >60 mL/min (ref >60)
Glucose, Bld: 135 mg/dL — ABNORMAL HIGH (ref 70–99)
Potassium: 3.5 mmol/L (ref 3.5–5.1)
Sodium: 138 mmol/L (ref 135–145)

## 2021-05-29 NOTE — ED Triage Notes (Signed)
Pt via pov from home with dizziness, sob, palpitations this morning. Pt states she has hx of vertigo "but this feels different." Pt took meclizine as usual for vertigo but this has not gotten better and she just "feels unwell." Pt alert & oriented, nad noted.

## 2021-05-29 NOTE — Telephone Encounter (Signed)
Attempted phone call to pt.  Left voicemail to contact RN at [phone].  It appears pt is currently in the ED at [facility].

## 2021-05-29 NOTE — ED Notes (Signed)
Pt states that she feels a little better than when she came and her initial headache is subsiding.

## 2021-05-29 NOTE — Telephone Encounter (Signed)
Called the pt in response to her MY Chart message.   She is home alone and she has h/o severe vertigo but today is different.. she has had heart fluttering and light headedness, she says he does not fel well. She is so light headed she cannot put on her event monitor that she just received.   She says ehr HR is about 60 but she keeps getting the heart fluttering and the lightheadedness is constant.   She denies nausea and vomiting.   I have asked her to call EMS but she declined and says that her friend, Juliann Pulse, can come to her home and take her to the [facility] ED.   I have asked her to be very careful with ambulation and she agrees.

## 2021-05-29 NOTE — Discharge Instructions (Addendum)
Follow-up with your cardiologist.  Follow-up with your primary care doctor.  May need MRI to further evaluate the dizziness.  Have ordered thyroid-stimulating hormone to get a measurement whether you are hypothyroid.  Which could explain the bradycardia.  That will show up on MyChart.  Return for any new or worse symptoms.

## 2021-05-29 NOTE — Telephone Encounter (Signed)
Pt sent message to scheduling requesting an appointment. She is already scheduled for 08/19 with Laurann Montana at [place]. She states,   "continuing vertigo severe, pain and tingling in my neck and head, fluttering heart, light headedness and unstable walking; slow heart rate, Turner ordered monitor, but cannot hook up myself, i am alone till husband ome tomorrow afternoon. i have a ride if i can be seen please"  See phone note from 07/19

## 2021-05-29 NOTE — ED Provider Notes (Signed)
[facility] EMERGENCY DEPT Provider Note   CSN: [id number] Arrival date & time: 05/29/21  1318     History Chief Complaint  Patient presents with   Dizziness   Palpitations   Shortness of Breath    Monday Amthor Der Burr Medico is a 65 y.o. female.  Patient presenting with 3 complaints 1 is dizziness.  Patient's had a history of vertigo in the past.  Did take some Antivert which has helped it.  But she felt this was acting differently.  Also patient concerned about her heart rate being in the 50s.  And then at times she feels as if it goes up to around 100.  Followed by Dr. Radford Pax from cardiology.  They were made aware of these complaints on July 19.  We are planning on doing some cardiac monitoring at home.  Patient also has had her thyroid removed.  Has had her thyroid medicine adjusted down.  Some concern that she could be somewhat hypothyroid.  She contacted cardiology today.  They recommended that she come in for lab checks.  Overall patient feeling better since arrival here.      Past Medical History:  Diagnosis Date   Anxiety    Arthritis    lower back and thumbs   Breast cancer of upper-outer quadrant of left female breast ([place]) 09/19/2013   Cancer ([place])    left breast-(12-12-09 radiation and surgery)-Dr. Jana Hakim -oncology   Chronic diastolic CHF (congestive heart failure) (HCC)    Colon polyps    sm aden p 10/06 (difficult exam, needs propofol);neg 07/2010   Dyslipidemia    Edema extremities 02/07/2016   GERD (gastroesophageal reflux disease)    Barrett's [facility]), neg for metaplasia '97, '08 (mod HH) 9.2011   HTN (hypertension)    Hypothyroidism, postsurgical 04/03/2013   Laryngitis 12-20-12   recent episode is resolving with Amoxicillin orally   Mild dilation of ascending aorta (HCC)    Obesity    Osteoporosis    Paroxysmal supraventricular tachycardia ([place]) 12/22/2013   Personal history of radiation therapy    2011   PONV (postoperative nausea and  vomiting)    Thyroid adenoma 12-20-12   surgery planned 12-30-12   Vaginal atrophy 08/18/2013   Vaginitis and vulvovaginitis 08/18/2013    Patient Active Problem List   Diagnosis Date Noted   Abscess of vulva 11/15/2018   Anxiety 11/15/2018   Insomnia 11/15/2018   Chronic diastolic heart failure ([place]) 04/18/2018   Benign essential HTN 04/18/2018   Congestive heart failure ([place]) 03/23/2018   Mild dilation of ascending aorta (HCC)    Elevated blood pressure reading without diagnosis of hypertension    Abnormal EKG 02/07/2016   Edema extremities 02/07/2016   Joint pain 03/19/2014   Paroxysmal supraventricular tachycardia ([place]) 12/22/2013   Breast cancer of upper-outer quadrant of left female breast ([place]) 09/19/2013   Vaginal atrophy 08/18/2013   Vaginitis and vulvovaginitis 08/18/2013   Pain, pelvic, female 08/18/2013   Hypothyroidism, postsurgical 04/03/2013    Past Surgical History:  Procedure Laterality Date   BREAST LUMPECTOMY Left    2011   BREAST SURGERY     left breast lumpectomy   EYE SURGERY  12-20-12   right eye retina tear repair   THYROIDECTOMY N/A 12/30/2012   Procedure: THYROIDECTOMY;  Surgeon: Earnstine Regal, MD;  Location: [facility];  Service: General;  Laterality: N/A;     OB History   No obstetric history on file.     Family History  Problem  Relation Age of Onset   Stroke Mother    Heart disease Father    Cancer Brother        melanoma    Social History   Tobacco Use   Smoking status: Former    Types: Cigarettes    Quit date: 01/31/2010    Years since quitting: 11.3   Smokeless tobacco: Never  Vaping Use   Vaping Use: Never used  Substance Use Topics   Alcohol use: Yes    Comment: social- occ   Drug use: No    Home Medications Prior to Admission medications   Medication Sig Start Date End Date Taking? Authorizing Provider  alendronate (FOSAMAX) 70 MG tablet Take 70 mg by mouth once a week. 02/28/20   [provider]  ALPRAZolam  Duanne Moron) 1 MG tablet Take 1 mg by mouth at bedtime as needed. 04/18/19   [provider]  aspirin EC 81 MG tablet Take 81 mg by mouth every other day. Sundays and Thursdays    [provider]  Calcium-Vitamin D-Vitamin K (VIACTIV CALCIUM PLUS D) 650-12.5-40 MG-MCG-MCG CHEW Chew by mouth.    [provider]  clobetasol (OLUX) 0.05 % topical foam APPLY TOPICALLY TO AFFECTED AREA SCALP UP TO TWICE A DAY AS NEEDED ( NOT TO FACE GROIN UNDERARM) 02/10/19   [provider]  Coenzyme Q10 (CO Q 10 PO) Take 20 mg by mouth as directed.    [provider]  cyclobenzaprine (FLEXERIL) 10 MG tablet TAKE 1 TABLET BY MOUTH AT BEDTIME AS NEEDED FOR MUSCLE SPASM 12/03/20   Hilts, Legrand Como, MD  diltiazem (CARTIA XT) 120 MG 24 hr capsule TAKE 1 CAPSULE BY MOUTH IN THE MORNING . APPOINTMENT REQUIRED FOR FUTURE REFILLS WITH  DR.  Radford Pax 02/18/21   Sueanne Margarita, MD  doxycycline (VIBRAMYCIN) 100 MG capsule Take 100 mg by mouth 2 (two) times daily. 05/01/20   [provider]  ELDERBERRY PO Take by mouth. With zinc. 2 chewables daily    [provider]  fluticasone (CUTIVATE) 0.05 % cream APPLY CREAM TOPICALLY TO AFFECTED AREA UP TO TWICE DAILY AS NEEDED 03/19/19   [provider]  furosemide (LASIX) 20 MG tablet Take 1 tablet (20 mg total) by mouth daily. 02/21/21   Sueanne Margarita, MD  hydrOXYzine (VISTARIL) 25 MG capsule Take 1 capsule by mouth daily as needed. itching 06/15/16   [provider]  ketoconazole (NIZORAL) 2 % cream APPLY TOPICALLY TO AFFECTED AREA UP TO TWICE DAILY AS NEEDED 02/04/20   [provider]  levothyroxine (SYNTHROID, LEVOTHROID) 137 MCG tablet Take 274 mcg by mouth daily before breakfast.    [provider]  Multiple Vitamin (MULTIVITAMIN) tablet Take 1 tablet by mouth daily.    [provider]  nabumetone (RELAFEN) 500 MG tablet Take 1 tablet by mouth twice daily as needed 12/03/20   Hilts, Michael, MD   Omega-3 Fatty Acids (FISH OIL) 1200 MG CAPS Take 1 capsule daily by mouth.    [provider]  omeprazole (PRILOSEC) 40 MG capsule Take 40 mg by mouth daily.    [provider]  pantoprazole (PROTONIX) 40 MG tablet Take 1 tablet (40 mg total) daily by mouth. 09/10/17   Dunn, Nedra Hai, PA-C  pravastatin (PRAVACHOL) 40 MG tablet Take 40 mg by mouth daily. 12/31/19   [provider]  sertraline (ZOLOFT) 100 MG tablet Take 150 mg by mouth daily.    [provider]  sodium chloride (OCEAN) 0.65 %  SOLN nasal spray Place 1 spray into the nose at bedtime.    [provider]  tiZANidine (ZANAFLEX) 2 MG tablet TAKE 1 TO 2 TABLETS BY MOUTH EVERY 6 HOURS AS NEEDED FOR MUSCLE SPASM 04/26/20   Hilts, Legrand Como, MD  traMADol (ULTRAM) 50 MG tablet Take 1-2 tablets (50-100 mg total) by mouth every 6 (six) hours as needed. 03/19/20   Hilts, Legrand Como, MD  Vitamin D-Vitamin K (VITAMIN K2-VITAMIN D3 PO) Take by mouth.    [provider]    Allergies    Sulfa antibiotics, Sulfa drugs cross reactors, and Venlafaxine  Review of Systems   Review of Systems  Constitutional:  Negative for chills and fever.  HENT:  Negative for ear pain and sore throat.   Eyes:  Negative for pain and visual disturbance.  Respiratory:  Negative for cough and shortness of breath.   Cardiovascular:  Positive for palpitations. Negative for chest pain.  Gastrointestinal:  Negative for abdominal pain and vomiting.  Genitourinary:  Negative for dysuria and hematuria.  Musculoskeletal:  Negative for arthralgias and back pain.  Skin:  Negative for color change and rash.  Neurological:  Positive for dizziness. Negative for seizures and syncope.  All other systems reviewed and are negative.  Physical Exam Updated Vital Signs BP 135/66   Pulse 60   Temp 98.5 F (36.9 C)   Resp 18   Ht 1.676 m ('5\' 6"'$ )   Wt 108 kg   SpO2 97%   BMI 38.41 kg/m   Physical Exam Vitals and nursing note  reviewed.  Constitutional:      General: She is not in acute distress.    Appearance: Normal appearance. She is well-developed.  HENT:     Head: Normocephalic and atraumatic.  Eyes:     Extraocular Movements: Extraocular movements intact.     Conjunctiva/sclera: Conjunctivae normal.     Pupils: Pupils are equal, round, and reactive to light.  Cardiovascular:     Rate and Rhythm: Normal rate and regular rhythm.     Heart sounds: No murmur heard. Pulmonary:     Effort: Pulmonary effort is normal. No respiratory distress.     Breath sounds: Normal breath sounds.  Chest:     Chest wall: No tenderness.  Abdominal:     Palpations: Abdomen is soft.     Tenderness: There is no abdominal tenderness.  Musculoskeletal:     Cervical back: Normal range of motion and neck supple.  Skin:    General: Skin is warm and dry.     Capillary Refill: Capillary refill takes less than 2 seconds.  Neurological:     General: No focal deficit present.     Mental Status: She is alert and oriented to person, place, and time.     Cranial Nerves: No cranial nerve deficit.     Sensory: No sensory deficit.     Motor: No weakness.     Comments: Some reproducible dizziness with movement of head to 1 side or the other.    ED Results / Procedures / Treatments   Labs (all labs ordered are listed, but only abnormal results are displayed) Labs Reviewed  BASIC METABOLIC PANEL - Abnormal; Notable for the following components:      Result Value   Glucose, Bld 135 (*)    All other components within normal limits  CBC - Abnormal; Notable for the following components:   Hemoglobin 11.6 (*)    RDW 19.9 (*)    All other components  within normal limits  URINALYSIS, ROUTINE W REFLEX MICROSCOPIC  TSH  CBG MONITORING, ED    EKG EKG Interpretation  Date/Time:  Thursday May 29 2021 13:41:43 EDT Ventricular Rate:  59 PR Interval:  198 QRS Duration: 92 QT Interval:  426 QTC Calculation: 421 R Axis:   15 Text  Interpretation: Sinus bradycardia Cannot rule out Anterior infarct , age undetermined Abnormal ECG Confirmed by Fredia Sorrow [phone]) on 05/29/2021 6:20:30 PM  Radiology CT Head Wo Contrast  Result Date: 05/29/2021 CLINICAL DATA:  Dizziness and shortness of breath. Vertigo. EXAM: CT HEAD WITHOUT CONTRAST TECHNIQUE: Contiguous axial images were obtained from the base of the skull through the vertex without intravenous contrast. COMPARISON:  None. FINDINGS: Brain: The ventricles are normal in size and configuration. No extra-axial fluid collections are identified. The gray-white differentiation is maintained. No CT findings for acute hemispheric infarction or intracranial hemorrhage. No mass lesions. The brainstem and cerebellum are normal. Vascular: Scattered vascular calcifications. No aneurysm or hyperdense vessels. Skull: No skull fracture or bone lesions. Sinuses/Orbits: The paranasal sinuses and mastoid air cells are clear. The globes are intact. Other: No scalp lesions or scalp hematoma. IMPRESSION: No acute intracranial findings or mass lesions. Electronically Signed   By: Marijo Sanes M.D.   On: 05/29/2021 19:15    Procedures Procedures   Medications Ordered in ED Medications - No data to display  ED Course  I have reviewed the triage vital signs and the nursing notes.  Pertinent labs & imaging results that were available during my care of the patient were reviewed by me and considered in my medical decision making (see chart for details).    MDM Rules/Calculators/A&P                           Patient's labs here today without any significant abnormalities no electrolyte abnormalities.  Normal white blood cell count.  Hemoglobin 11.6.  EKG here sinus bradycardia.  No arrhythmias noted while being monitored cardiac wise.  Head CT was done since MRI not available.  No acute findings on head CT.  Patient's dizziness does seem to follow some of her past vertigo complaint.  And  certainly has been going on for a while so no reason for acute MRI.  But outpatient MRI would be appropriate.  Patient also may be hypothyroid.  TSH ordered to help gauge that.  That is not back patient will follow up on MyChart.  She will follow-up with cardiology as well as her primary care doctor.   Final Clinical Impression(s) / ED Diagnoses Final diagnoses:  Dizziness  Palpitations  Bradycardia    Rx / DC Orders ED Discharge Orders     None        Fredia Sorrow, MD 05/29/21 2058

## 2021-05-30 ENCOUNTER — Ambulatory Visit (INDEPENDENT_AMBULATORY_CARE_PROVIDER_SITE_OTHER): Payer: PPO

## 2021-05-30 DIAGNOSIS — I471 Supraventricular tachycardia: Secondary | ICD-10-CM

## 2021-05-30 LAB — TSH: TSH: 26.087 u[IU]/mL — ABNORMAL HIGH (ref 0.350–4.500)

## 2021-06-02 DIAGNOSIS — F419 Anxiety disorder, unspecified: Secondary | ICD-10-CM | POA: Diagnosis not present

## 2021-06-02 DIAGNOSIS — E039 Hypothyroidism, unspecified: Secondary | ICD-10-CM | POA: Diagnosis not present

## 2021-06-02 DIAGNOSIS — Z6841 Body Mass Index (BMI) 40.0 and over, adult: Secondary | ICD-10-CM | POA: Diagnosis not present

## 2021-06-02 DIAGNOSIS — R001 Bradycardia, unspecified: Secondary | ICD-10-CM | POA: Diagnosis not present

## 2021-06-13 ENCOUNTER — Telehealth: Payer: Self-pay | Admitting: Cardiology

## 2021-06-13 ENCOUNTER — Ambulatory Visit: Payer: PPO

## 2021-06-13 NOTE — Telephone Encounter (Signed)
  Per MyChart scheduling message:  Good Friday Morning,   I had an allergic reaction to the adhesive when I first placed my monitor on my chest..Marland KitchenIt was itchy and uncomfortable, but bearable.  But my entire chest got very angry, painful and broke out in a rash and "burn" spots when I replaced it after a week. I immediately removed it, which caused my irritation. I am attaching a photo to show you how it looks today. It is much improved, but not well. I have see a dermatologist appt  this Tuesday 16th.    I have received the skin sensitive units to wear. Never occurred to me to ask for them. ? Hopefully Dr Rozann Lesches can recommend something to treat the area and get it healed.    I just wanted to update you on the situation. I will see April Holding at [place] at 9am 1 week from today.   See attached.   Thank you, [name] [phone]

## 2021-06-17 DIAGNOSIS — D225 Melanocytic nevi of trunk: Secondary | ICD-10-CM | POA: Diagnosis not present

## 2021-06-17 DIAGNOSIS — Z1283 Encounter for screening for malignant neoplasm of skin: Secondary | ICD-10-CM | POA: Diagnosis not present

## 2021-06-17 DIAGNOSIS — L293 Anogenital pruritus, unspecified: Secondary | ICD-10-CM | POA: Diagnosis not present

## 2021-06-19 ENCOUNTER — Other Ambulatory Visit: Payer: Self-pay

## 2021-06-19 ENCOUNTER — Ambulatory Visit
Admission: RE | Admit: 2021-06-19 | Discharge: 2021-06-19 | Disposition: A | Discharge status: Home / Self Care | Payer: PPO | Source: Ambulatory Visit | Attending: Internal Medicine | Admitting: Internal Medicine

## 2021-06-19 DIAGNOSIS — Z1231 Encounter for screening mammogram for malignant neoplasm of breast: Secondary | ICD-10-CM | POA: Diagnosis not present

## 2021-06-20 ENCOUNTER — Ambulatory Visit (HOSPITAL_BASED_OUTPATIENT_CLINIC_OR_DEPARTMENT_OTHER): Payer: PPO | Admitting: Family

## 2021-06-20 ENCOUNTER — Encounter (HOSPITAL_BASED_OUTPATIENT_CLINIC_OR_DEPARTMENT_OTHER): Payer: Self-pay | Admitting: Family

## 2021-06-20 VITALS — BP 122/72 | HR 63 | Ht 66.0 in | Wt 246.0 lb

## 2021-06-20 DIAGNOSIS — I5032 Chronic diastolic (congestive) heart failure: Secondary | ICD-10-CM

## 2021-06-20 DIAGNOSIS — R42 Dizziness and giddiness: Secondary | ICD-10-CM

## 2021-06-20 DIAGNOSIS — I471 Supraventricular tachycardia: Secondary | ICD-10-CM

## 2021-06-20 DIAGNOSIS — I10 Essential (primary) hypertension: Secondary | ICD-10-CM

## 2021-06-20 NOTE — Patient Instructions (Signed)
Medication Instructions:  Continue your current medications.   *If you need a refill on your cardiac medications before your next appointment, please call your pharmacy*  Lab Work: None ordered today  Testing/Procedures: Your EKG today showed normal sinus rhythm which is a great result!   Follow-Up: At [facility], you and your health needs are our priority.  As part of our continuing mission to provide you with exceptional heart care, we have created designated Provider Care Teams.  These Care Teams include your primary Cardiologist (physician) and Advanced Practice Providers (APPs -  Physician Assistants and Nurse Practitioners) who all work together to provide you with the care you need, when you need it.  We recommend signing up for the patient portal called "MyChart".  Sign up information is provided on this After Visit Summary.  MyChart is used to connect with patients for Virtual Visits (Telemedicine).  Patients are able to view lab/test results, encounter notes, upcoming appointments, etc.  Non-urgent messages can be sent to your provider as well.   To learn more about what you can do with MyChart, go to [email or web address].    Your next appointment:   12/09/2020 at 1 pm with Dr. Radford Pax   Other Instructions  Heart Healthy Diet Recommendations: A low-salt diet is recommended. Meats should be grilled, baked, or boiled. Avoid fried foods. Focus on lean protein sources like fish or chicken with vegetables and fruits. The American Heart Association is a Microbiologist!    Exercise recommendations: The American Heart Association recommends 150 minutes of moderate intensity exercise weekly. Try 30 minutes of moderate intensity exercise 4-5 times per week. This could include walking, jogging, or swimming.

## 2021-06-20 NOTE — Progress Notes (Signed)
Office Visit    Patient Name: [name] Date of Encounter: 06/20/2021  PCP:  Haywood Pao, MD   [place]  Cardiologist:  Fransico Him, MD  Advanced Practice Provider:  No care team member to display Electrophysiologist:  None   Chief Complaint    [name] is a 65 y.o. female with a hx of PSVT, mildly dilated ascending aorta, thyroid adenoma, remote breast cancer, former tobacco abuse, DLD, GERD, HTN, obesity, arthritis, Barrett's esophagus presents today for dizziness   Past Medical History    Past Medical History:  Diagnosis Date   Anxiety    Arthritis    lower back and thumbs   Breast cancer of upper-outer quadrant of left female breast ([place]) 09/19/2013   Cancer ([place])    left breast-(12-12-09 radiation and surgery)-Dr. Jana Hakim -oncology   Chronic diastolic CHF (congestive heart failure) ([place])    Colon polyps    sm aden p 10/06 (difficult exam, needs propofol);neg 07/2010   Dyslipidemia    Edema extremities 02/07/2016   GERD (gastroesophageal reflux disease)    Barrett's [facility]), neg for metaplasia '97, '08 (mod HH) 9.2011   HTN (hypertension)    Hypothyroidism, postsurgical 04/03/2013   Laryngitis 12-20-12   recent episode is resolving with Amoxicillin orally   Mild dilation of ascending aorta (HCC)    Obesity    Osteoporosis    Paroxysmal supraventricular tachycardia ([place]) 12/22/2013   Personal history of radiation therapy    2011   PONV (postoperative nausea and vomiting)    Thyroid adenoma 12-20-12   surgery planned 12-30-12   Vaginal atrophy 08/18/2013   Vaginitis and vulvovaginitis 08/18/2013   Past Surgical History:  Procedure Laterality Date   BREAST LUMPECTOMY Left    2011   BREAST SURGERY     left breast lumpectomy   EYE SURGERY  12-20-12   right eye retina tear repair   THYROIDECTOMY N/A 12/30/2012   Procedure: THYROIDECTOMY;  Surgeon: Earnstine Regal, MD;  Location: [facility];  Service:  General;  Laterality: N/A;    Allergies  Allergies  Allergen Reactions   Sulfa Antibiotics Hives   Sulfa Drugs Cross Reactors Hives   Venlafaxine     History of Present Illness    [name] is a 65 y.o. female with a hx of PSVT, mildly dilated ascending aorta, thyroid melanoma, remote breast cancer, former tobacco use, dyslipidemia, GERD, mild hypertension, obesity, arthritis, Barrett's esophagus last seen 12/11/2020 via video visit.  Echo 09/16/17 with normal LVEF 123456, grade 2 diastolic dysfunction, mildly dilated ascending aorta 41m.   She was last seen via video visit 12/2020 and doing well from cardiac perspective.  She contacted the office 05/20/2021 noting episode of SVT that lasted for about 5 seconds as well as bradycardia with heart rate in the 50s.  She additionally endorsed dizziness, headaches and multiple adjustments to Synthroid.  She was recommended for 30-day monitor.  Monitor was placed and she wore for 8 days between she went to replace the sticker she had skin irritation and follow dermatologist.  She has since been provided with sensitive skin patches but has not yet started to wear.  She presents today for follow up.  Tells me it was noted by her primary care office that she was sent in the wrong dose of levothyroxine.  This has since been corrected.  She is gradually increasing her dose and has been on and  increasing taper since August 1.  Notes that since this her symptoms have resolved.  She reports no bradycardia, palpitations.  Her dizziness and lightheadedness have completely resolved.  Her energy level is markedly improved. BP at home routinely in the 120s. She monitors her sodium level very carefully and has been reading labels carefully to follow a low-salt diet.  She plans to start walking with a friend at [place].  EKGs/Labs/Other Studies Reviewed:   The following studies were reviewed today:  EKG:  EKG is ordered today.  The ekg  ordered today demonstrates NSR 63 bpm with no acute St/T wave changes.   Recent Labs: 05/29/2021: BUN 15; Creatinine, Ser 1.00; Hemoglobin 11.6; Platelets 223; Potassium 3.5; Sodium 138; TSH 26.087  Recent Lipid Panel No results found for: CHOL, TRIG, HDL, CHOLHDL, VLDL, LDLCALC, LDLDIRECT  Home Medications   Current Meds  Medication Sig   alendronate (FOSAMAX) 70 MG tablet Take 70 mg by mouth once a week.   ALPRAZolam (XANAX) 1 MG tablet Take 1 mg by mouth at bedtime as needed.   aspirin EC 81 MG tablet Take 81 mg by mouth every other day. Sundays and Thursdays   Calcium-Vitamin D-Vitamin K (VIACTIV CALCIUM PLUS D) 650-12.5-40 MG-MCG-MCG CHEW Chew by mouth.   clobetasol (OLUX) 0.05 % topical foam APPLY TOPICALLY TO AFFECTED AREA SCALP UP TO TWICE A DAY AS NEEDED ( NOT TO FACE GROIN UNDERARM)   Coenzyme Q10 (CO Q 10 PO) Take 20 mg by mouth as directed.   cyclobenzaprine (FLEXERIL) 10 MG tablet TAKE 1 TABLET BY MOUTH AT BEDTIME AS NEEDED FOR MUSCLE SPASM   diltiazem (CARTIA XT) 120 MG 24 hr capsule TAKE 1 CAPSULE BY MOUTH IN THE MORNING . APPOINTMENT REQUIRED FOR FUTURE REFILLS WITH  DR.  Radford Pax   fluticasone (CUTIVATE) 0.05 % cream APPLY CREAM TOPICALLY TO AFFECTED AREA UP TO TWICE DAILY AS NEEDED   furosemide (LASIX) 20 MG tablet Take 1 tablet (20 mg total) by mouth daily.   hydrOXYzine (VISTARIL) 25 MG capsule Take 1 capsule by mouth daily as needed. itching   ketoconazole (NIZORAL) 2 % cream APPLY TOPICALLY TO AFFECTED AREA UP TO TWICE DAILY AS NEEDED   meclizine (ANTIVERT) 25 MG tablet Take 12.5 mg by mouth every morning.   Multiple Vitamin (MULTIVITAMIN) tablet Take 1 tablet by mouth daily.   nabumetone (RELAFEN) 500 MG tablet Take 1 tablet by mouth twice daily as needed   Omega-3 Fatty Acids (FISH OIL) 1200 MG CAPS Take 1 capsule daily by mouth.   omeprazole (PRILOSEC) 40 MG capsule Take 40 mg by mouth daily.   pantoprazole (PROTONIX) 40 MG tablet Take 1 tablet (40 mg total) daily by  mouth.   pravastatin (PRAVACHOL) 40 MG tablet Take 40 mg by mouth daily.   sertraline (ZOLOFT) 100 MG tablet Take 150 mg by mouth daily.   sodium chloride (OCEAN) 0.65 % SOLN nasal spray Place 1 spray into the nose at bedtime.   SYNTHROID 112 MCG tablet Take 224 mcg by mouth every morning.   tiZANidine (ZANAFLEX) 2 MG tablet TAKE 1 TO 2 TABLETS BY MOUTH EVERY 6 HOURS AS NEEDED FOR MUSCLE SPASM   traMADol (ULTRAM) 50 MG tablet Take 1-2 tablets (50-100 mg total) by mouth every 6 (six) hours as needed.   Vitamin D-Vitamin K (VITAMIN K2-VITAMIN D3 PO) Take by mouth.   [DISCONTINUED] levothyroxine (SYNTHROID, LEVOTHROID) 137 MCG tablet Take 274 mcg by mouth daily before breakfast.     Review of Systems  All other systems reviewed and are otherwise negative except as noted above.  Physical Exam    VS:  BP 122/72   Pulse 63   Ht '5\' 6"'$  (1.676 m)   Wt 246 lb (111.6 kg)   BMI 39.71 kg/m  , BMI Body mass index is 39.71 kg/m.  Wt Readings from Last 3 Encounters:  06/20/21 246 lb (111.6 kg)  05/29/21 238 lb (108 kg)  12/11/20 232 lb (105.2 kg)    GEN: Well nourished, well developed, in no acute distress. HEENT: normal. Neck: Supple, no JVD, carotid bruits, or masses. Cardiac: RRR, no murmurs, rubs, or gallops. No clubbing, cyanosis, edema.  Radials/PT 2+ and equal bilaterally.  Respiratory:  Respirations regular and unlabored, clear to auscultation bilaterally. GI: Soft, nontender, nondistended. MS: No deformity or atrophy. Skin: Warm and dry, no rash. Neuro:  Strength and sensation are intact. Psych: Normal affect.  Assessment & Plan    PSVT -previous bradycardia and extreme fatigue in the evening.  Direct dose of hypothyroidism.  No recurrent palpitations or bradycardia since levothyroxine dose has gradually been increased.  As symptoms have resolved, will defer further [employer] monitoring and she will return her monitor.  We will continue her current dose of diltiazem 120 mg  daily.  Hypothyroidism-Previous thyroidectomy.  Recently was sent in a wrong dose of levothyroxine by primary care provider leading to TSH changing from 19 to 26.  PCP office has since corrected and starting 3 weeks ago she was on a taper to gradually increase dose of levothyroxine.  Chronic diastolic heart failure - Euvolemic and well compensated on exam.  Reports improvement in lower extremity edema since following more strict low-salt diet.  Continue current dose frusemide 20 mg daily.  HTN - BP well controlled. Continue current antihypertensive regimen.    Disposition: Follow up  12/09/2021  with Dr. Radford Pax or APP.  Signed, Loel Dubonnet, NP 06/20/2021, 1:10 PM [place]

## 2021-06-23 ENCOUNTER — Other Ambulatory Visit: Payer: Self-pay | Admitting: Internal Medicine

## 2021-06-23 DIAGNOSIS — R928 Other abnormal and inconclusive findings on diagnostic imaging of breast: Secondary | ICD-10-CM

## 2021-07-02 ENCOUNTER — Ambulatory Visit
Admission: RE | Admit: 2021-07-02 | Discharge: 2021-07-02 | Disposition: A | Discharge status: Home / Self Care | Payer: PPO | Source: Ambulatory Visit | Attending: Internal Medicine | Admitting: Internal Medicine

## 2021-07-02 ENCOUNTER — Other Ambulatory Visit: Payer: Self-pay

## 2021-07-02 ENCOUNTER — Other Ambulatory Visit: Payer: Self-pay | Admitting: Internal Medicine

## 2021-07-02 DIAGNOSIS — N6489 Other specified disorders of breast: Secondary | ICD-10-CM

## 2021-07-02 DIAGNOSIS — R922 Inconclusive mammogram: Secondary | ICD-10-CM | POA: Diagnosis not present

## 2021-07-02 DIAGNOSIS — R928 Other abnormal and inconclusive findings on diagnostic imaging of breast: Secondary | ICD-10-CM

## 2021-07-04 ENCOUNTER — Other Ambulatory Visit: Payer: PPO

## 2021-07-11 ENCOUNTER — Ambulatory Visit
Admission: RE | Admit: 2021-07-11 | Discharge: 2021-07-11 | Disposition: A | Discharge status: Home / Self Care | Payer: PPO | Source: Ambulatory Visit | Attending: Internal Medicine | Admitting: Internal Medicine

## 2021-07-11 ENCOUNTER — Other Ambulatory Visit: Payer: Self-pay

## 2021-07-11 DIAGNOSIS — N6313 Unspecified lump in the right breast, lower outer quadrant: Secondary | ICD-10-CM | POA: Diagnosis not present

## 2021-07-11 DIAGNOSIS — N6489 Other specified disorders of breast: Secondary | ICD-10-CM

## 2021-07-11 DIAGNOSIS — N6011 Diffuse cystic mastopathy of right breast: Secondary | ICD-10-CM | POA: Diagnosis not present

## 2021-07-11 DIAGNOSIS — M26609 Unspecified temporomandibular joint disorder, unspecified side: Secondary | ICD-10-CM | POA: Diagnosis not present

## 2021-07-11 DIAGNOSIS — H8111 Benign paroxysmal vertigo, right ear: Secondary | ICD-10-CM | POA: Diagnosis not present

## 2021-07-11 DIAGNOSIS — H9202 Otalgia, left ear: Secondary | ICD-10-CM | POA: Diagnosis not present

## 2021-07-31 DIAGNOSIS — E039 Hypothyroidism, unspecified: Secondary | ICD-10-CM | POA: Diagnosis not present

## 2021-08-04 DIAGNOSIS — E039 Hypothyroidism, unspecified: Secondary | ICD-10-CM | POA: Diagnosis not present

## 2021-08-04 DIAGNOSIS — F419 Anxiety disorder, unspecified: Secondary | ICD-10-CM | POA: Diagnosis not present

## 2021-08-04 DIAGNOSIS — R001 Bradycardia, unspecified: Secondary | ICD-10-CM | POA: Diagnosis not present

## 2021-08-04 DIAGNOSIS — Z6841 Body Mass Index (BMI) 40.0 and over, adult: Secondary | ICD-10-CM | POA: Diagnosis not present

## 2021-08-04 DIAGNOSIS — D649 Anemia, unspecified: Secondary | ICD-10-CM | POA: Diagnosis not present

## 2021-08-04 DIAGNOSIS — M25561 Pain in right knee: Secondary | ICD-10-CM | POA: Diagnosis not present

## 2021-08-08 ENCOUNTER — Other Ambulatory Visit: Payer: Self-pay

## 2021-08-08 ENCOUNTER — Ambulatory Visit: Payer: Self-pay

## 2021-08-08 ENCOUNTER — Ambulatory Visit (INDEPENDENT_AMBULATORY_CARE_PROVIDER_SITE_OTHER): Payer: PPO | Admitting: Orthopedic Surgery

## 2021-08-08 DIAGNOSIS — M25561 Pain in right knee: Secondary | ICD-10-CM | POA: Diagnosis not present

## 2021-08-08 DIAGNOSIS — G8929 Other chronic pain: Secondary | ICD-10-CM | POA: Diagnosis not present

## 2021-08-08 MED ORDER — NABUMETONE 500 MG PO TABS: ORAL_TABLET | ORAL | 0 refills | Status: DC

## 2021-08-09 ENCOUNTER — Encounter: Payer: Self-pay | Admitting: Orthopedic Surgery

## 2021-08-09 NOTE — Progress Notes (Signed)
Office Visit Note   Patient: [name]           Date of Birth: [birth year]           MRN: [id number] Visit Date: 08/08/2021 Requested by: Haywood Pao, MD [address] [place],  [place] [address] PCP: Osborne Casco Fransico Him, MD  Subjective: Chief Complaint  Patient presents with   Right Knee - Follow-up    HPI: [name] is an ambulatory 65 year old patient with right knee pain.  She has had knee pain for several months.  She has tripped up the stairs on 2 occasions over the past several months.  Direct impact to the patellar region.  Having some aching and throbbing in the knee.  Last 3 weeks the pain has been in the posterior aspect of her knee.  Patient states the pain does wake her from sleep at night with the whole leg aching at times.  Denies any groin pain.  Takes Tylenol for symptoms but has taken Relafen in the past with good relief.  Going from sitting to standing is difficult.  Denies any discrete mechanical symptoms or giving way in the knee.              ROS: All systems reviewed are negative as they relate to the chief complaint within the history of present illness.  Patient denies  fevers or chills.   Assessment & Plan: Visit Diagnoses:  1. Chronic pain of right knee     Plan: Impression is right knee pain with mild patellofemoral arthritis on radiographs but no fracture.  No effusion in the knee.  [name] wants to try Relafen for about 5 days.  If that does not help I think intra-articular cortisone injection would be the next step.  Continue with nonweightbearing quad strengthening exercises and follow-up as needed.  No acute structural abnormality detected in the knee at this time.  Follow-Up Instructions: No follow-ups on file.   Orders:  Orders Placed This Encounter  Procedures   XR Knee 1-2 Views Right   Meds ordered this encounter  Medications   nabumetone (RELAFEN) 500 MG tablet    Sig: 1 po bid x 5 days    Dispense:  30 tablet     Refill:  0      Procedures: No procedures performed   Clinical Data: No additional findings.  Objective: Vital Signs: There were no vitals taken for this visit.  Physical Exam:   Constitutional: Patient appears well-developed HEENT:  Head: Normocephalic Eyes:EOM are normal Neck: Normal range of motion Cardiovascular: Normal rate Pulmonary/chest: Effort normal Neurologic: Patient is alert Skin: Skin is warm Psychiatric: Patient has normal mood and affect   Ortho Exam: Ortho exam demonstrates full range of motion of the ankle and knee.  Extensor mechanism is intact.  Collateral and cruciate ligaments are stable.  No masses lymphadenopathy or skin changes noted in the right knee region including no Baker's cyst.  No effusion in the knee.  Patient has mild symmetric bilateral patellofemoral crepitus.  Specialty Comments:  No specialty comments available.  Imaging: No results found.   PMFS History: Patient Active Problem List   Diagnosis Date Noted   Abscess of vulva 11/15/2018   Anxiety 11/15/2018   Insomnia 11/15/2018   Chronic diastolic heart failure ([place]) 04/18/2018   Benign essential HTN 04/18/2018   Congestive heart failure ([place]) 03/23/2018   Mild dilation of ascending aorta (HCC)    Elevated blood pressure reading without diagnosis of hypertension  Abnormal EKG 02/07/2016   Edema extremities 02/07/2016   Joint pain 03/19/2014   Paroxysmal supraventricular tachycardia (HCC) 12/22/2013   Breast cancer of upper-outer quadrant of left female breast ([place]) 09/19/2013   Vaginal atrophy 08/18/2013   Vaginitis and vulvovaginitis 08/18/2013   Pain, pelvic, female 08/18/2013   Hypothyroidism, postsurgical 04/03/2013   Past Medical History:  Diagnosis Date   Anxiety    Arthritis    lower back and thumbs   Breast cancer of upper-outer quadrant of left female breast ([place]) 09/19/2013   Cancer ([place])    left breast-(12-12-09 radiation and surgery)-Dr. Jana Hakim  -oncology   Chronic diastolic CHF (congestive heart failure) ([place])    Colon polyps    sm aden p 10/06 (difficult exam, needs propofol);neg 07/2010   Dyslipidemia    Edema extremities 02/07/2016   GERD (gastroesophageal reflux disease)    [facility]'s [facility]), neg for metaplasia '97, '08 (mod HH) 9.2011   HTN (hypertension)    Hypothyroidism, postsurgical 04/03/2013   Laryngitis 12-20-12   recent episode is resolving with Amoxicillin orally   Mild dilation of ascending aorta (HCC)    Obesity    Osteoporosis    Paroxysmal supraventricular tachycardia ([place]) 12/22/2013   Personal history of radiation therapy    2011   PONV (postoperative nausea and vomiting)    Thyroid adenoma 12-20-12   surgery planned 12-30-12   Vaginal atrophy 08/18/2013   Vaginitis and vulvovaginitis 08/18/2013    Family History  Problem Relation Age of Onset   Stroke Mother    Heart disease Father    Cancer Brother        melanoma    Past Surgical History:  Procedure Laterality Date   BREAST LUMPECTOMY Left    2011   BREAST SURGERY     left breast lumpectomy   EYE SURGERY  12-20-12   right eye retina tear repair   THYROIDECTOMY N/A 12/30/2012   Procedure: THYROIDECTOMY;  Surgeon: Earnstine Regal, MD;  Location: [facility];  Service: General;  Laterality: N/A;   Social History   Occupational History   Not on file  Tobacco Use   Smoking status: Former    Types: Cigarettes    Quit date: 01/31/2010    Years since quitting: 11.5   Smokeless tobacco: Never  Vaping Use   Vaping Use: Never used  Substance and Sexual Activity   Alcohol use: Yes    Comment: social- occ   Drug use: No   Sexual activity: Yes    Birth control/protection: Post-menopausal

## 2021-08-15 DIAGNOSIS — H8111 Benign paroxysmal vertigo, right ear: Secondary | ICD-10-CM | POA: Diagnosis not present

## 2021-09-03 DIAGNOSIS — M810 Age-related osteoporosis without current pathological fracture: Secondary | ICD-10-CM | POA: Diagnosis not present

## 2021-09-03 DIAGNOSIS — I471 Supraventricular tachycardia: Secondary | ICD-10-CM | POA: Diagnosis not present

## 2021-09-03 DIAGNOSIS — E7800 Pure hypercholesterolemia, unspecified: Secondary | ICD-10-CM | POA: Diagnosis not present

## 2021-09-03 DIAGNOSIS — E041 Nontoxic single thyroid nodule: Secondary | ICD-10-CM | POA: Diagnosis not present

## 2021-09-03 DIAGNOSIS — F419 Anxiety disorder, unspecified: Secondary | ICD-10-CM | POA: Diagnosis not present

## 2021-09-03 DIAGNOSIS — I77810 Thoracic aortic ectasia: Secondary | ICD-10-CM | POA: Diagnosis not present

## 2021-09-03 DIAGNOSIS — I519 Heart disease, unspecified: Secondary | ICD-10-CM | POA: Diagnosis not present

## 2021-09-03 DIAGNOSIS — C50912 Malignant neoplasm of unspecified site of left female breast: Secondary | ICD-10-CM | POA: Diagnosis not present

## 2021-09-03 DIAGNOSIS — E039 Hypothyroidism, unspecified: Secondary | ICD-10-CM | POA: Diagnosis not present

## 2021-09-04 ENCOUNTER — Telehealth: Payer: Self-pay | Admitting: Orthopedic Surgery

## 2021-09-04 NOTE — Telephone Encounter (Signed)
scheduled

## 2021-09-04 NOTE — Telephone Encounter (Signed)
Pt calling saying she is in a lot of pain. Dr. Randel Pigg next opening is 11/21 and [facility] is 11/16. Pt is previous urgent referral from Earle Gell and wanted to be seen immediately but did not know if she needs to be worked in with [facility] prior or if he has a provider here he would recommend she go to. The best call back number is [phone].

## 2021-09-05 ENCOUNTER — Ambulatory Visit: Payer: PPO | Admitting: Surgical

## 2021-09-24 ENCOUNTER — Ambulatory Visit: Payer: PPO | Admitting: Orthopedic Surgery

## 2021-11-16 ENCOUNTER — Other Ambulatory Visit: Payer: Self-pay | Admitting: Cardiology

## 2021-12-09 ENCOUNTER — Ambulatory Visit: Payer: PPO | Admitting: Cardiology

## 2021-12-09 ENCOUNTER — Other Ambulatory Visit: Payer: Self-pay

## 2021-12-09 ENCOUNTER — Encounter: Payer: Self-pay | Admitting: Cardiology

## 2021-12-09 VITALS — BP 114/64 | HR 78 | Ht 66.0 in | Wt 247.0 lb

## 2021-12-09 DIAGNOSIS — I10 Essential (primary) hypertension: Secondary | ICD-10-CM

## 2021-12-09 DIAGNOSIS — M79605 Pain in left leg: Secondary | ICD-10-CM | POA: Diagnosis not present

## 2021-12-09 DIAGNOSIS — I5032 Chronic diastolic (congestive) heart failure: Secondary | ICD-10-CM | POA: Diagnosis not present

## 2021-12-09 DIAGNOSIS — I77810 Thoracic aortic ectasia: Secondary | ICD-10-CM | POA: Diagnosis not present

## 2021-12-09 DIAGNOSIS — I471 Supraventricular tachycardia: Secondary | ICD-10-CM | POA: Diagnosis not present

## 2021-12-09 DIAGNOSIS — M79604 Pain in right leg: Secondary | ICD-10-CM

## 2021-12-09 MED ORDER — DILTIAZEM HCL ER COATED BEADS 120 MG PO CP24: ORAL_CAPSULE | ORAL | 3 refills | Status: DC

## 2021-12-09 MED ORDER — FUROSEMIDE 20 MG PO TABS: 20.0000 mg | ORAL_TABLET | Freq: Every day | ORAL | 3 refills | Status: DC

## 2021-12-09 NOTE — Addendum Note (Signed)
Addended by: Antonieta Iba on: 12/09/2021 01:48 PM   Modules accepted: Orders

## 2021-12-09 NOTE — Addendum Note (Signed)
Addended by: Molli Barrows on: 12/09/2021 01:46 PM   Modules accepted: Orders

## 2021-12-09 NOTE — Progress Notes (Signed)
Date:  12/09/2021   ID:  [name], DOB 05-27-1956, MRN [id number] The patient was identified using 2 identifiers.  PCP:  Tisovec, Fransico Him, MD  Cardiologist:  Fransico Him, MD  Electrophysiologist:  None   Evaluation Performed:  Follow-Up Visit  Chief Complaint:  SVT, HTN, dilated aorta, CHF  History of Present Illness:    [name] is a 66 y.o. female with a hx of PSVT, mildly dilated ascending aorta, thyroid adenoma, remote breast CA, former tobacco abuse, dyslipidemia (followed by PCP), GERD, recent mild HTN, obesity.  Her last echo on 09/16/2017 showed normal LV function with EF 60 to 65% with grade 2 diastolic dysfunction, mildly dilated ascending aorta at 38 mm.  She is here today for followup and is doing well.  She denies any chest pain or pressure, SOB, DOE, PND, orthopnea, dizziness, palpitations or syncope.  She has been having  problems with increased LE edema.  She says that she also has been having problems with cold feet, numbness in her toes and has to sleep in socks because they are so cold and pain in her leg. She is compliant with her meds and is tolerating meds with no SE.     Past Medical History:  Diagnosis Date   Anxiety    Arthritis    lower back and thumbs   Breast cancer of upper-outer quadrant of left female breast ([place]) 09/19/2013   Cancer ([place])    left breast-(12-12-09 radiation and surgery)-Dr. Jana Hakim -oncology   Chronic diastolic CHF (congestive heart failure) (HCC)    Colon polyps    sm aden p 10/06 (difficult exam, needs propofol);neg 07/2010   Dyslipidemia    Edema extremities 02/07/2016   GERD (gastroesophageal reflux disease)    Barrett's [facility]), neg for metaplasia '97, '08 (mod HH) 9.2011   HTN (hypertension)    Hypothyroidism, postsurgical 04/03/2013   Laryngitis 12-20-12   recent episode is resolving with Amoxicillin orally   Mild dilation of ascending aorta (HCC)    Obesity    Osteoporosis     Paroxysmal supraventricular tachycardia ([place]) 12/22/2013   Personal history of radiation therapy    2011   PONV (postoperative nausea and vomiting)    Thyroid adenoma 12-20-12   surgery planned 12-30-12   Vaginal atrophy 08/18/2013   Vaginitis and vulvovaginitis 08/18/2013   Past Surgical History:  Procedure Laterality Date   BREAST LUMPECTOMY Left    2011   BREAST SURGERY     left breast lumpectomy   EYE SURGERY  12-20-12   right eye retina tear repair   THYROIDECTOMY N/A 12/30/2012   Procedure: THYROIDECTOMY;  Surgeon: Earnstine Regal, MD;  Location: [facility];  Service: General;  Laterality: N/A;     Current Meds  Medication Sig   alendronate (FOSAMAX) 70 MG tablet Take 70 mg by mouth once a week.   ALPRAZolam (XANAX) 1 MG tablet Take 1 mg by mouth at bedtime as needed.   aspirin EC 81 MG tablet Take 81 mg by mouth every other day. Sundays and Thursdays   Calcium-Vitamin D-Vitamin K (VIACTIV CALCIUM PLUS D) 650-12.5-40 MG-MCG-MCG CHEW Chew by mouth.   clobetasol (OLUX) 0.05 % topical foam APPLY TOPICALLY TO AFFECTED AREA SCALP UP TO TWICE A DAY AS NEEDED ( NOT TO FACE GROIN UNDERARM)   Coenzyme Q10 (CO Q 10 PO) Take 20 mg by mouth as directed.   cyclobenzaprine (FLEXERIL) 10 MG tablet TAKE 1 TABLET BY MOUTH  AT BEDTIME AS NEEDED FOR MUSCLE SPASM   diltiazem (CARDIZEM CD) 120 MG 24 hr capsule TAKE 1 CAPSULE BY MOUTH ONCE DAILY IN THE MORNING .   ELDERBERRY PO Take by mouth. With zinc. 2 chewables daily   fluticasone (CUTIVATE) 0.05 % cream APPLY CREAM TOPICALLY TO AFFECTED AREA UP TO TWICE DAILY AS NEEDED   furosemide (LASIX) 20 MG tablet Take 1 tablet (20 mg total) by mouth daily.   hydrOXYzine (VISTARIL) 25 MG capsule Take 1 capsule by mouth daily as needed. itching   ketoconazole (NIZORAL) 2 % cream APPLY TOPICALLY TO AFFECTED AREA UP TO TWICE DAILY AS NEEDED   Multiple Vitamin (MULTIVITAMIN) tablet Take 1 tablet by mouth daily.   nabumetone (RELAFEN) 500 MG tablet Take 1 tablet by  mouth twice daily as needed   Omega-3 Fatty Acids (FISH OIL) 1200 MG CAPS Take 1 capsule daily by mouth.   omeprazole (PRILOSEC) 40 MG capsule Take 40 mg by mouth daily.   pantoprazole (PROTONIX) 40 MG tablet Take 1 tablet (40 mg total) daily by mouth.   pravastatin (PRAVACHOL) 40 MG tablet Take 40 mg by mouth daily.   sertraline (ZOLOFT) 100 MG tablet Take 150 mg by mouth daily.   sodium chloride (OCEAN) 0.65 % SOLN nasal spray Place 1 spray into the nose at bedtime.   SYNTHROID 112 MCG tablet Take 224 mcg by mouth every morning.   tiZANidine (ZANAFLEX) 2 MG tablet TAKE 1 TO 2 TABLETS BY MOUTH EVERY 6 HOURS AS NEEDED FOR MUSCLE SPASM   traMADol (ULTRAM) 50 MG tablet Take 1-2 tablets (50-100 mg total) by mouth every 6 (six) hours as needed.   Vitamin D-Vitamin K (VITAMIN K2-VITAMIN D3 PO) Take by mouth.     Allergies:   Sulfa antibiotics, Sulfa drugs cross reactors, and Venlafaxine   Social History   Tobacco Use   Smoking status: Former    Types: Cigarettes    Quit date: 01/31/2010    Years since quitting: 11.8   Smokeless tobacco: Never  Vaping Use   Vaping Use: Never used  Substance Use Topics   Alcohol use: Yes    Comment: social- occ   Drug use: No     Family Hx: The patient's family history includes Cancer in her brother; Heart disease in her father; Stroke in her mother.  ROS:   Please see the history of present illness.     All other systems reviewed and are negative.   Prior CV studies:   The following studies were reviewed today:  none  Labs/Other Tests and Data Reviewed:    EKG:  No ECG reviewed.  Recent Labs: 05/29/2021: BUN 15; Creatinine, Ser 1.00; Hemoglobin 11.6; Platelets 223; Potassium 3.5; Sodium 138; TSH 26.087   Recent Lipid Panel No results found for: CHOL, TRIG, HDL, CHOLHDL, LDLCALC, LDLDIRECT  Wt Readings from Last 3 Encounters:  12/09/21 247 lb (112 kg)  06/20/21 246 lb (111.6 kg)  05/29/21 238 lb (108 kg)     Risk  Assessment/Calculations:      Objective:    Vital Signs:  BP 114/64    Pulse 78    Ht 5\' 6"  (1.676 m)    Wt 247 lb (112 kg)    SpO2 96%    BMI 39.87 kg/m   GEN: Well nourished, well developed in no acute distress HEENT: Normal NECK: No JVD; No carotid bruits LYMPHATICS: No lymphadenopathy CARDIAC:RRR, no murmurs, rubs, gallops RESPIRATORY:  Clear to auscultation without rales, wheezing or rhonchi  ABDOMEN: Soft, non-tender, non-distended MUSCULOSKELETAL:  No edema; No deformity  SKIN: Warm and dry NEUROLOGIC:  Alert and oriented x 3 PSYCHIATRIC:  Normal affect   ASSESSMENT & PLAN:    1.  PSVT -Her PSVT has been very quiescent and she denies any palpitations since I saw her last -Continue prescription drug therapy with Cardizem CD 120 mg daily with as needed refills   2.  Dilated ascending aorta -2D echo with normal aortic root and ascending aortic diameter   3.  Chronic diastolic CHF -Her weight remains very stable and she denies any shortness of breath but has had some problems with what she thought was pitting LE edema recently but what she thinks is edema on exam today, she does not have any pitting edema. She does have problems with varicose veins that may be causing her pain.  - she will continue to use compression hose during the day -Continue prescription drug management with Lasix 20 mg daily with as needed refills -Check BMET  4.  HTN -BP is well controlled on exam today -continue on Cardizem CD 120mg  daily  5.  Bilateral leg pain -she is concerned that she could have DVTs as she is very sedentary with her job -I suspect that the coldness and numbness of her legs if more related to neuropathy but I will get LE arterial and venous dopplers to rule out PAD and DVT   Medication Adjustments/Labs and Tests Ordered: Current medicines are reviewed at length with the patient today.  Concerns regarding medicines are outlined above.   Tests Ordered: No orders of the  defined types were placed in this encounter.   Medication Changes: No orders of the defined types were placed in this encounter.   Follow Up:  In Person in 1 year(s)  Signed, Fransico Him, MD  12/09/2021 1:39 PM    [facility]

## 2021-12-09 NOTE — Patient Instructions (Signed)
Medication Instructions:  Your physician recommends that you continue on your current medications as directed. Please refer to the Current Medication list given to you today.  *If you need a refill on your cardiac medications before your next appointment, please call your pharmacy*   Lab Work: TODAY: BMET If you have labs (blood work) drawn today and your tests are completely normal, you will receive your results only by: [facility] (if you have MyChart) OR A paper copy in the mail If you have any lab test that is abnormal or we need to change your treatment, we will call you to review the results.   Testing/Procedures: Your physician has requested that you have a lower extremity arterial duplex. This test is an ultrasound of the arteries in the legs or arms. It looks at arterial blood flow in the legs and arms. Allow one hour for Lower and Upper Arterial scans. There are no restrictions or special instructions   Your physician has requested that you have a lower extremity venous duplex. This test is an ultrasound of the veins in the legs or arms. It looks at venous blood flow that carries blood from the heart to the legs or arms. Allow one hour for a Lower Venous exam. Allow thirty minutes for an Upper Venous exam. There are no restrictions or special instructions.     Follow-Up: At [facility], you and your health needs are our priority.  As part of our continuing mission to provide you with exceptional heart care, we have created designated Provider Care Teams.  These Care Teams include your primary Cardiologist (physician) and Advanced Practice Providers (APPs -  Physician Assistants and Nurse Practitioners) who all work together to provide you with the care you need, when you need it.  Your next appointment:   1 year(s)  The format for your next appointment:   In Person  Provider:   Fransico Him, MD

## 2021-12-10 LAB — BASIC METABOLIC PANEL
BUN/Creatinine Ratio: 15 (ref 12–28)
BUN: 11 mg/dL (ref 8–27)
CO2: 24 mmol/L (ref 20–29)
Calcium: 9.7 mg/dL (ref 8.7–10.3)
Chloride: 102 mmol/L (ref 96–106)
Creatinine, Ser: 0.74 mg/dL (ref 0.57–1.00)
Glucose: 97 mg/dL (ref 70–99)
Potassium: 4.1 mmol/L (ref 3.5–5.2)
Sodium: 143 mmol/L (ref 134–144)
eGFR: 89 mL/min/{1.73_m2} (ref >59)

## 2021-12-12 ENCOUNTER — Other Ambulatory Visit: Payer: Self-pay | Admitting: Cardiology

## 2021-12-12 ENCOUNTER — Other Ambulatory Visit: Payer: Self-pay

## 2021-12-12 ENCOUNTER — Ambulatory Visit (HOSPITAL_COMMUNITY)
Admission: RE | Admit: 2021-12-12 | Discharge: 2021-12-12 | Disposition: A | Discharge status: Home / Self Care | Payer: PPO | Source: Ambulatory Visit | Attending: Internal Medicine | Admitting: Internal Medicine

## 2021-12-12 DIAGNOSIS — I77810 Thoracic aortic ectasia: Secondary | ICD-10-CM

## 2021-12-12 DIAGNOSIS — I10 Essential (primary) hypertension: Secondary | ICD-10-CM | POA: Insufficient documentation

## 2021-12-12 DIAGNOSIS — I5032 Chronic diastolic (congestive) heart failure: Secondary | ICD-10-CM | POA: Diagnosis not present

## 2021-12-12 DIAGNOSIS — M79604 Pain in right leg: Secondary | ICD-10-CM | POA: Diagnosis not present

## 2021-12-12 DIAGNOSIS — M79605 Pain in left leg: Secondary | ICD-10-CM | POA: Insufficient documentation

## 2021-12-12 DIAGNOSIS — I471 Supraventricular tachycardia: Secondary | ICD-10-CM | POA: Insufficient documentation

## 2021-12-18 ENCOUNTER — Other Ambulatory Visit: Payer: Self-pay | Admitting: Cardiology

## 2021-12-18 ENCOUNTER — Ambulatory Visit (HOSPITAL_COMMUNITY)
Admission: RE | Admit: 2021-12-18 | Discharge: 2021-12-18 | Disposition: A | Discharge status: Home / Self Care | Payer: PPO | Source: Ambulatory Visit | Attending: Cardiology | Admitting: Cardiology

## 2021-12-18 ENCOUNTER — Other Ambulatory Visit: Payer: Self-pay

## 2021-12-18 DIAGNOSIS — I5032 Chronic diastolic (congestive) heart failure: Secondary | ICD-10-CM | POA: Insufficient documentation

## 2021-12-18 DIAGNOSIS — M79604 Pain in right leg: Secondary | ICD-10-CM | POA: Diagnosis not present

## 2021-12-18 DIAGNOSIS — I77810 Thoracic aortic ectasia: Secondary | ICD-10-CM | POA: Diagnosis not present

## 2021-12-18 DIAGNOSIS — I471 Supraventricular tachycardia: Secondary | ICD-10-CM | POA: Diagnosis not present

## 2021-12-18 DIAGNOSIS — R200 Anesthesia of skin: Secondary | ICD-10-CM | POA: Diagnosis not present

## 2021-12-18 DIAGNOSIS — M79605 Pain in left leg: Secondary | ICD-10-CM | POA: Insufficient documentation

## 2021-12-18 DIAGNOSIS — R202 Paresthesia of skin: Secondary | ICD-10-CM | POA: Insufficient documentation

## 2021-12-18 DIAGNOSIS — I10 Essential (primary) hypertension: Secondary | ICD-10-CM | POA: Insufficient documentation

## 2021-12-31 ENCOUNTER — Ambulatory Visit: Payer: PPO | Admitting: Orthopedic Surgery

## 2021-12-31 ENCOUNTER — Ambulatory Visit (INDEPENDENT_AMBULATORY_CARE_PROVIDER_SITE_OTHER): Payer: PPO

## 2021-12-31 ENCOUNTER — Encounter: Payer: Self-pay | Admitting: Orthopedic Surgery

## 2021-12-31 ENCOUNTER — Ambulatory Visit: Payer: Self-pay

## 2021-12-31 ENCOUNTER — Other Ambulatory Visit: Payer: Self-pay

## 2021-12-31 DIAGNOSIS — M79604 Pain in right leg: Secondary | ICD-10-CM

## 2021-12-31 MED ORDER — NABUMETONE 500 MG PO TABS: ORAL_TABLET | ORAL | 0 refills | Status: DC

## 2021-12-31 MED ORDER — TIZANIDINE HCL 2 MG PO TABS: 2.0000 mg | ORAL_TABLET | Freq: Two times a day (BID) | ORAL | 0 refills | Status: DC | PRN

## 2021-12-31 NOTE — Progress Notes (Signed)
? ?Office Visit Note ?  ?Patient: [name]           ?Date of Birth: 09-01-1956           ?MRN: [id number] ?Visit Date: 12/31/2021 ?Requested by: Haywood Pao, MD ?[address] ?[place],  [place] [address] ?PCP: Haywood Pao, MD ? ?Subjective: ?Chief Complaint  ?Patient presents with  ? Right Hip - Pain  ? Lower Back - Pain  ? ? ?HPI: [name] is a 66 year old patient with low back pain and hip pain.  She has chronic back problems but this episode started over 6 weeks ago with no injury.  Pain is a constant dull ache which is a little worse on the right-hand side just above the iliac crest region posteriorly.  Reports some very occasional leg pain on the right but no numbness and tingling.  Takes anti-inflammatories Tylenol ice but gets no relief from heat.  Ice also helps. ?             ?ROS: All systems reviewed are negative as they relate to the chief complaint within the history of present illness.  Patient denies  fevers or chills. ? ? ?Assessment & Plan: ?Visit Diagnoses:  ?1. Pain in right leg   ? ? ?Plan: Impression is low back pain which looks to be more facet arthritis mediated based on the fairly focal nature of the pain.  No nerve root tension signs today.  Does not really look radicular.  Based on radiographs I think this is likely degenerative in nature and facet mediated for the most part.  Been ongoing for longer than 6 weeks.  She has tried a home exercise program of walking and stretching and that has not helped.  Has also taken a lot of medication which has not helped.  Best bet now is MRI scan of the lumbar spine to evaluate L5-S1 osteoarthritis and facet arthritis and to prepare her for epidural steroid injection.  Refill tizanidine and Relafen.  Once I get those scan results I can review them and then set her up to see Dr. Ernestina Patches to save her a trip. ? ?Follow-Up Instructions: No follow-ups on file.  ? ?Orders:  ?Orders Placed This Encounter  ?Procedures  ? XR HIP  UNILAT W OR W/O PELVIS 2-3 VIEWS RIGHT  ? XR Lumbar Spine 2-3 Views  ? MR Lumbar Spine w/o contrast  ? ?Meds ordered this encounter  ?Medications  ? tiZANidine (ZANAFLEX) 2 MG tablet  ?  Sig: Take 1 tablet (2 mg total) by mouth every 12 (twelve) hours as needed for muscle spasms.  ?  Dispense:  30 tablet  ?  Refill:  0  ? nabumetone (RELAFEN) 500 MG tablet  ?  Sig: 1 po q d prn  ?  Dispense:  30 tablet  ?  Refill:  0  ? ? ? ? Procedures: ?No procedures performed ? ? ?Clinical Data: ?No additional findings. ? ?Objective: ?Vital Signs: There were no vitals taken for this visit. ? ?Physical Exam:  ? ?Constitutional: Patient appears well-developed ?HEENT:  ?Head: Normocephalic ?Eyes:EOM are normal ?Neck: Normal range of motion ?Cardiovascular: Normal rate ?Pulmonary/chest: Effort normal ?Neurologic: Patient is alert ?Skin: Skin is warm ?Psychiatric: Patient has normal mood and affect ? ? ?Ortho Exam: Ortho exam demonstrates full active and passive range of motion of the knees and hips.  Has 5 out of 5 ankle dorsiflexion plantarflexion quad hamstring strength with no nerve root tension signs.  No  paresthesias L1 S1 bilaterally.  Pedal pulses palpable.  Mild trochanteric tenderness on the right not on the left.  Does have some pain with forward and lateral bending.  No masses lymphadenopathy or skin changes noted in the back region. ? ?Specialty Comments:  ?No specialty comments available. ? ?Imaging: ?XR Lumbar Spine 2-3 Views ? ?Result Date: 12/31/2021 ?AP lateral radiographs lumbar spine reviewed.  Significant arthritis at L5-S1 noted with joint space narrowing.  Facet arthritis also present in the lower lumbosacral region.  No acute fracture.  Joint spaces otherwise well-maintained in the upper lumbar regions.  ? ? ?PMFS History: ?Patient Active Problem List  ? Diagnosis Date Noted  ? Abscess of vulva 11/15/2018  ? Anxiety 11/15/2018  ? Insomnia 11/15/2018  ? Chronic diastolic heart failure ([place]) 04/18/2018  ? Benign  essential HTN 04/18/2018  ? Congestive heart failure ([place]) 03/23/2018  ? Mild dilation of ascending aorta (HCC)   ? Elevated blood pressure reading without diagnosis of hypertension   ? Abnormal EKG 02/07/2016  ? Edema extremities 02/07/2016  ? Joint pain 03/19/2014  ? Paroxysmal supraventricular tachycardia ([place]) 12/22/2013  ? Breast cancer of upper-outer quadrant of left female breast ([place]) 09/19/2013  ? Vaginal atrophy 08/18/2013  ? Vaginitis and vulvovaginitis 08/18/2013  ? Pain, pelvic, female 08/18/2013  ? Hypothyroidism, postsurgical 04/03/2013  ? ?Past Medical History:  ?Diagnosis Date  ? Anxiety   ? Arthritis   ? lower back and thumbs  ? Breast cancer of upper-outer quadrant of left female breast ([place]) 09/19/2013  ? Cancer [facility])   ? left breast-(12-12-09 radiation and surgery)-Dr. Jana Hakim -oncology  ? Chronic diastolic CHF (congestive heart failure) ([place])   ? Colon polyps   ? sm aden p 10/06 (difficult exam, needs propofol);neg 07/2010  ? Dyslipidemia   ? Edema extremities 02/07/2016  ? GERD (gastroesophageal reflux disease)   ? [facility]'s [facility]), neg for metaplasia '97, '08 (mod HH) 9.2011  ? HTN (hypertension)   ? Hypothyroidism, postsurgical 04/03/2013  ? Laryngitis 12-20-12  ? recent episode is resolving with Amoxicillin orally  ? Mild dilation of ascending aorta (HCC)   ? Obesity   ? Osteoporosis   ? Paroxysmal supraventricular tachycardia ([place]) 12/22/2013  ? Personal history of radiation therapy   ? 2011  ? PONV (postoperative nausea and vomiting)   ? Thyroid adenoma 12-20-12  ? surgery planned 12-30-12  ? Vaginal atrophy 08/18/2013  ? Vaginitis and vulvovaginitis 08/18/2013  ?  ?Family History  ?Problem Relation Age of Onset  ? Stroke Mother   ? Heart disease Father   ? Cancer Brother   ?     melanoma  ?  ?Past Surgical History:  ?Procedure Laterality Date  ? BREAST LUMPECTOMY Left   ? 2011  ? BREAST SURGERY    ? left breast lumpectomy  ? EYE SURGERY  12-20-12  ? right eye retina tear repair  ? THYROIDECTOMY  N/A 12/30/2012  ? Procedure: THYROIDECTOMY;  Surgeon: Earnstine Regal, MD;  Location: [facility];  Service: General;  Laterality: N/A;  ? ?Social History  ? ?Occupational History  ? Not on file  ?Tobacco Use  ? Smoking status: Former  ?  Types: Cigarettes  ?  Quit date: 01/31/2010  ?  Years since quitting: 11.9  ? Smokeless tobacco: Never  ?Vaping Use  ? Vaping Use: Never used  ?Substance and Sexual Activity  ? Alcohol use: Yes  ?  Comment: social- occ  ? Drug use: No  ? Sexual activity: Yes  ?  Birth control/protection: Post-menopausal  ? ? ? ? ? ?

## 2022-01-04 ENCOUNTER — Other Ambulatory Visit: Payer: Self-pay

## 2022-01-04 ENCOUNTER — Ambulatory Visit
Admission: RE | Admit: 2022-01-04 | Discharge: 2022-01-04 | Disposition: A | Discharge status: Home / Self Care | Payer: PPO | Source: Ambulatory Visit | Attending: Orthopedic Surgery | Admitting: Orthopedic Surgery

## 2022-01-04 DIAGNOSIS — M48061 Spinal stenosis, lumbar region without neurogenic claudication: Secondary | ICD-10-CM | POA: Diagnosis not present

## 2022-01-04 DIAGNOSIS — M545 Low back pain, unspecified: Secondary | ICD-10-CM | POA: Diagnosis not present

## 2022-01-04 DIAGNOSIS — M25551 Pain in right hip: Secondary | ICD-10-CM | POA: Diagnosis not present

## 2022-01-04 DIAGNOSIS — M4807 Spinal stenosis, lumbosacral region: Secondary | ICD-10-CM | POA: Diagnosis not present

## 2022-01-04 DIAGNOSIS — M79604 Pain in right leg: Secondary | ICD-10-CM

## 2022-01-05 NOTE — Progress Notes (Signed)
Does she have f/u?

## 2022-01-14 ENCOUNTER — Ambulatory Visit: Payer: PPO | Admitting: Orthopedic Surgery

## 2022-01-14 ENCOUNTER — Encounter: Payer: Self-pay | Admitting: Orthopedic Surgery

## 2022-01-14 ENCOUNTER — Other Ambulatory Visit: Payer: Self-pay

## 2022-01-14 DIAGNOSIS — M79604 Pain in right leg: Secondary | ICD-10-CM

## 2022-01-14 MED ORDER — NABUMETONE 500 MG PO TABS: 500.0000 mg | ORAL_TABLET | Freq: Every day | ORAL | 0 refills | Status: DC

## 2022-01-14 MED ORDER — TIZANIDINE HCL 2 MG PO TABS: 2.0000 mg | ORAL_TABLET | Freq: Two times a day (BID) | ORAL | 0 refills | Status: DC | PRN

## 2022-01-14 MED ORDER — NABUMETONE 500 MG PO TABS: ORAL_TABLET | ORAL | 0 refills | Status: DC

## 2022-01-14 NOTE — Progress Notes (Signed)
? ?Office Visit Note ?  ?Patient: [name]           ?Date of Birth: 1956-06-11           ?MRN: [id number] ?Visit Date: 01/14/2022 ?Requested by: Haywood Pao, MD ?[address] ?[place],  [place] [address] ?PCP: Haywood Pao, MD ? ?Subjective: ?Chief Complaint  ?Patient presents with  ? Other  ?  Scan review  ? ? ?HPI: [name] is a 66 year old patient with low back pain.  Since she was last seen she has had an MRI scan of her lumbar spine which shows chronic T12 compression fracture.  She also has some neuroforaminal impingement which is mild in the lower lumbar spine regions.  No edema around that vertebral body indicating chronic type situation.  She states the dog did not occur down on October 16, 2021 but she did not really have immediate pain with that until later in January.  Unlikely based on that history that the compression fracture occurred at that time.  She has water aerobics pending.  She has a memory foam pillow which helps her to rest.  She has been taking tizanidine and Relafen and we talked today about the trade-offs of taking that sporadically versus consistently. ?             ?ROS: All systems reviewed are negative as they relate to the chief complaint within the history of present illness.  Patient denies  fevers or chills. ? ? ?Assessment & Plan: ?Visit Diagnoses:  ?1. Pain in right leg   ? ? ?Plan: Impression is pain in the right hip and leg which is likely related to the back.  I think epidural steroid injection would be a reasonable next step if her symptoms warrant.  She wants to try the least invasive thing first.  She is going to try to do some stretching and conditioning and core strengthening which I think could also help with her back situation.  Tizanidine and Relafen refilled.  We will see her back as needed. ? ?Follow-Up Instructions: Return if symptoms worsen or fail to improve.  ? ?Orders:  ?No orders of the defined types were placed in this  encounter. ? ?No orders of the defined types were placed in this encounter. ? ? ? ? Procedures: ?No procedures performed ? ? ?Clinical Data: ?No additional findings. ? ?Objective: ?Vital Signs: There were no vitals taken for this visit. ? ?Physical Exam:  ? ?Constitutional: Patient appears well-developed ?HEENT:  ?Head: Normocephalic ?Eyes:EOM are normal ?Neck: Normal range of motion ?Cardiovascular: Normal rate ?Pulmonary/chest: Effort normal ?Neurologic: Patient is alert ?Skin: Skin is warm ?Psychiatric: Patient has normal mood and affect ? ? ?Ortho Exam: Ortho exam demonstrates full active and passive range of motion of the right and left hip.  Patient has good hip flexion abduction adduction strength.  No nerve root tension signs.  Ankle dorsiflexion plantarflexion quad hamstring strength intact.  Rest of her exam is unchanged. ? ?Specialty Comments:  ?No specialty comments available. ? ?Imaging: ?No results found. ? ? ?PMFS History: ?Patient Active Problem List  ? Diagnosis Date Noted  ? Abscess of vulva 11/15/2018  ? Anxiety 11/15/2018  ? Insomnia 11/15/2018  ? Chronic diastolic heart failure ([place]) 04/18/2018  ? Benign essential HTN 04/18/2018  ? Congestive heart failure ([place]) 03/23/2018  ? Mild dilation of ascending aorta (HCC)   ? Elevated blood pressure reading without diagnosis of hypertension   ? Abnormal EKG 02/07/2016  ?  Edema extremities 02/07/2016  ? Joint pain 03/19/2014  ? Paroxysmal supraventricular tachycardia ([place]) 12/22/2013  ? Breast cancer of upper-outer quadrant of left female breast ([place]) 09/19/2013  ? Vaginal atrophy 08/18/2013  ? Vaginitis and vulvovaginitis 08/18/2013  ? Pain, pelvic, female 08/18/2013  ? Hypothyroidism, postsurgical 04/03/2013  ? ?Past Medical History:  ?Diagnosis Date  ? Anxiety   ? Arthritis   ? lower back and thumbs  ? Breast cancer of upper-outer quadrant of left female breast ([place]) 09/19/2013  ? Cancer [facility])   ? left breast-(12-12-09 radiation and surgery)-Dr.  Jana Hakim -oncology  ? Chronic diastolic CHF (congestive heart failure) ([place])   ? Colon polyps   ? sm aden p 10/06 (difficult exam, needs propofol);neg 07/2010  ? Dyslipidemia   ? Edema extremities 02/07/2016  ? GERD (gastroesophageal reflux disease)   ? [address] [facility]), neg for metaplasia '97, '08 (mod HH) 9.2011  ? HTN (hypertension)   ? Hypothyroidism, postsurgical 04/03/2013  ? Laryngitis 12-20-12  ? recent episode is resolving with Amoxicillin orally  ? Mild dilation of ascending aorta (HCC)   ? Obesity   ? Osteoporosis   ? Paroxysmal supraventricular tachycardia ([place]) 12/22/2013  ? Personal history of radiation therapy   ? 2011  ? PONV (postoperative nausea and vomiting)   ? Thyroid adenoma 12-20-12  ? surgery planned 12-30-12  ? Vaginal atrophy 08/18/2013  ? Vaginitis and vulvovaginitis 08/18/2013  ?  ?Family History  ?Problem Relation Age of Onset  ? Stroke Mother   ? Heart disease Father   ? Cancer Brother   ?     melanoma  ?  ?Past Surgical History:  ?Procedure Laterality Date  ? BREAST LUMPECTOMY Left   ? 2011  ? BREAST SURGERY    ? left breast lumpectomy  ? EYE SURGERY  12-20-12  ? right eye retina tear repair  ? THYROIDECTOMY N/A 12/30/2012  ? Procedure: THYROIDECTOMY;  Surgeon: Earnstine Regal, MD;  Location: [facility];  Service: General;  Laterality: N/A;  ? ?Social History  ? ?Occupational History  ? Not on file  ?Tobacco Use  ? Smoking status: Former  ?  Types: Cigarettes  ?  Quit date: 01/31/2010  ?  Years since quitting: 11.9  ? Smokeless tobacco: Never  ?Vaping Use  ? Vaping Use: Never used  ?Substance and Sexual Activity  ? Alcohol use: Yes  ?  Comment: social- occ  ? Drug use: No  ? Sexual activity: Yes  ?  Birth control/protection: Post-menopausal  ? ? ? ? ? ?

## 2022-02-06 ENCOUNTER — Other Ambulatory Visit: Payer: Self-pay | Admitting: Orthopedic Surgery

## 2022-02-06 DIAGNOSIS — M79604 Pain in right leg: Secondary | ICD-10-CM

## 2022-02-09 NOTE — Telephone Encounter (Signed)
Ok to rf x 1 pls claal thx

## 2022-02-09 NOTE — Telephone Encounter (Signed)
done

## 2022-02-22 ENCOUNTER — Other Ambulatory Visit: Payer: Self-pay | Admitting: Orthopedic Surgery

## 2022-02-22 DIAGNOSIS — M79604 Pain in right leg: Secondary | ICD-10-CM

## 2022-02-26 ENCOUNTER — Telehealth: Payer: Self-pay | Admitting: Orthopedic Surgery

## 2022-02-26 NOTE — Telephone Encounter (Signed)
Patient called. She would like a refill on Pending Medication Requests ? ? ?tiZANidine HCl 2 MG TAKE 1 TABLET BY MOUTH EVERY 12 HOURS AS NEEDED FOR MUSCLE SPASM ?

## 2022-02-27 ENCOUNTER — Other Ambulatory Visit: Payer: Self-pay | Admitting: Surgical

## 2022-02-27 DIAGNOSIS — M79604 Pain in right leg: Secondary | ICD-10-CM

## 2022-02-27 MED ORDER — TIZANIDINE HCL 2 MG PO TABS: ORAL_TABLET | ORAL | 0 refills | Status: DC

## 2022-02-27 NOTE — Telephone Encounter (Signed)
IC advised. Patient verbalized understanding 

## 2022-02-27 NOTE — Telephone Encounter (Signed)
I sent in refill for patient.  Just let her know she should not be taking the Flexeril that is listed in her chart with this medication.  Thank you

## 2022-03-02 NOTE — Telephone Encounter (Signed)
Prescriptions done 3 days ago by Lurena Joiner on the 28th thanks please call her thanks

## 2022-03-17 DIAGNOSIS — E7800 Pure hypercholesterolemia, unspecified: Secondary | ICD-10-CM | POA: Diagnosis not present

## 2022-03-17 DIAGNOSIS — M810 Age-related osteoporosis without current pathological fracture: Secondary | ICD-10-CM | POA: Diagnosis not present

## 2022-03-17 DIAGNOSIS — E039 Hypothyroidism, unspecified: Secondary | ICD-10-CM | POA: Diagnosis not present

## 2022-03-17 DIAGNOSIS — I519 Heart disease, unspecified: Secondary | ICD-10-CM | POA: Diagnosis not present

## 2022-03-18 ENCOUNTER — Ambulatory Visit: Payer: PPO | Admitting: Orthopedic Surgery

## 2022-03-18 DIAGNOSIS — R222 Localized swelling, mass and lump, trunk: Secondary | ICD-10-CM | POA: Diagnosis not present

## 2022-03-18 DIAGNOSIS — M25512 Pain in left shoulder: Secondary | ICD-10-CM

## 2022-03-19 ENCOUNTER — Other Ambulatory Visit: Payer: Self-pay | Admitting: Surgical

## 2022-03-19 DIAGNOSIS — M79604 Pain in right leg: Secondary | ICD-10-CM

## 2022-03-21 ENCOUNTER — Encounter: Payer: Self-pay | Admitting: Orthopedic Surgery

## 2022-03-21 NOTE — Progress Notes (Signed)
Office Visit Note   Patient: [name]           Date of Birth: Apr 24, 1956           MRN: [id number] Visit Date: 03/18/2022 Requested by: Haywood Pao, MD [address] [place],  [place] [address] PCP: Haywood Pao, MD  Subjective: Chief Complaint  Patient presents with   Lower Back - Follow-up   Other    Eval left chest wall mass     HPI: [name] is a 66 year old patient here to recheck lumbar spine as well as chest wall area which she is having more problems with.  She states her pain is improving but she still is having some discomfort.  Has a chronic T12 compression fracture with mild neuroforaminal impingement.  Taking Relafen every other day as well as tizanidine.  She is doing some pool work for walking.  She does have a history of osteoporosis.  Overall the back is something she can live with.  We talked about injections but she wants to hold off for now on that.  She is slightly more concerned about left chest wall lipoma which has become painful for her.  She feels like it is getting larger and would like to have it looked at again.  Describes new burning type pain around that chest wall lipoma.              ROS: All systems reviewed are negative as they relate to the chief complaint within the history of present illness.  Patient denies  fevers or chills.   Assessment & Plan: Visit Diagnoses:  1. Left shoulder pain, unspecified chronicity   2. Nodule of left anterior chest wall     Plan: Impression is lumbar spine which is stable.  Continue with walking stretching and home exercises.  Chest wall lipoma is larger.  Having pain which is atypical for lipomas along with burning which may indicate some nerve compression.  The left chest wall MRI with contrast IV to evaluate this mass.  Follow-up after that study.  Follow-Up Instructions: Return for after MRI.   Orders:  Orders Placed This Encounter  Procedures   MR CHEST W WO CONTRAST   No  orders of the defined types were placed in this encounter.     Procedures: No procedures performed   Clinical Data: No additional findings.  Objective: Vital Signs: There were no vitals taken for this visit.  Physical Exam:   Constitutional: Patient appears well-developed HEENT:  Head: Normocephalic Eyes:EOM are normal Neck: Normal range of motion Cardiovascular: Normal rate Pulmonary/chest: Effort normal Neurologic: Patient is alert Skin: Skin is warm Psychiatric: Patient has normal mood and affect   Ortho Exam: Ortho exam demonstrates no nerve root tension signs with 5 out of 5 ankle dorsiflexion plantarflexion quad hamstring strength.  No groin pain with internal/external [place] of the leg.  Pedal pulses palpable.  No trochanteric tenderness.  Mild pain with forward lateral bending.  Chest wall mass on the left measures about 4 or 5 cm in diameter and has mild tenderness to touch.  Appears mobile without firm attachments to the underlying fascia.  No discoloration around the lipoma.  Specialty Comments:  No specialty comments available.  Imaging: No results found.   PMFS History: Patient Active Problem List   Diagnosis Date Noted   Abscess of vulva 11/15/2018   Anxiety 11/15/2018   Insomnia 11/15/2018   Chronic diastolic heart failure ([place]) 04/18/2018   Benign  essential HTN 04/18/2018   Congestive heart failure ([place]) 03/23/2018   Mild dilation of ascending aorta (HCC)    Elevated blood pressure reading without diagnosis of hypertension    Abnormal EKG 02/07/2016   Edema extremities 02/07/2016   Joint pain 03/19/2014   Paroxysmal supraventricular tachycardia ([place]) 12/22/2013   Breast cancer of upper-outer quadrant of left female breast ([place]) 09/19/2013   Vaginal atrophy 08/18/2013   Vaginitis and vulvovaginitis 08/18/2013   Pain, pelvic, female 08/18/2013   Hypothyroidism, postsurgical 04/03/2013   Past Medical History:  Diagnosis Date   Anxiety     Arthritis    lower back and thumbs   Breast cancer of upper-outer quadrant of left female breast ([place]) 09/19/2013   Cancer ([place])    left breast-(12-12-09 radiation and surgery)-Dr. Jana Hakim -oncology   Chronic diastolic CHF (congestive heart failure) (HCC)    Colon polyps    sm aden p 10/06 (difficult exam, needs propofol);neg 07/2010   Dyslipidemia    Edema extremities 02/07/2016   GERD (gastroesophageal reflux disease)    [facility] [address] [facility]), neg for metaplasia '97, '08 (mod HH) 9.2011   HTN (hypertension)    Hypothyroidism, postsurgical 04/03/2013   Laryngitis 12-20-12   recent episode is resolving with Amoxicillin orally   Mild dilation of ascending aorta (HCC)    Obesity    Osteoporosis    Paroxysmal supraventricular tachycardia ([place]) 12/22/2013   Personal history of radiation therapy    2011   PONV (postoperative nausea and vomiting)    Thyroid adenoma 12-20-12   surgery planned 12-30-12   Vaginal atrophy 08/18/2013   Vaginitis and vulvovaginitis 08/18/2013    Family History  Problem Relation Age of Onset   Stroke Mother    Heart disease Father    Cancer Brother        melanoma    Past Surgical History:  Procedure Laterality Date   BREAST LUMPECTOMY Left    2011   BREAST SURGERY     left breast lumpectomy   EYE SURGERY  12-20-12   right eye retina tear repair   THYROIDECTOMY N/A 12/30/2012   Procedure: THYROIDECTOMY;  Surgeon: Earnstine Regal, MD;  Location: [facility];  Service: General;  Laterality: N/A;   Social History   Occupational History   Not on file  Tobacco Use   Smoking status: Former    Types: Cigarettes    Quit date: 01/31/2010    Years since quitting: 12.1   Smokeless tobacco: Never  Vaping Use   Vaping Use: Never used  Substance and Sexual Activity   Alcohol use: Yes    Comment: social- occ   Drug use: No   Sexual activity: Yes    Birth control/protection: Post-menopausal

## 2022-03-24 DIAGNOSIS — Z0000 Encounter for general adult medical examination without abnormal findings: Secondary | ICD-10-CM | POA: Diagnosis not present

## 2022-03-24 DIAGNOSIS — F419 Anxiety disorder, unspecified: Secondary | ICD-10-CM | POA: Diagnosis not present

## 2022-03-24 DIAGNOSIS — I519 Heart disease, unspecified: Secondary | ICD-10-CM | POA: Diagnosis not present

## 2022-03-24 DIAGNOSIS — M810 Age-related osteoporosis without current pathological fracture: Secondary | ICD-10-CM | POA: Diagnosis not present

## 2022-03-24 DIAGNOSIS — E7800 Pure hypercholesterolemia, unspecified: Secondary | ICD-10-CM | POA: Diagnosis not present

## 2022-03-24 DIAGNOSIS — I77810 Thoracic aortic ectasia: Secondary | ICD-10-CM | POA: Diagnosis not present

## 2022-03-24 DIAGNOSIS — I471 Supraventricular tachycardia: Secondary | ICD-10-CM | POA: Diagnosis not present

## 2022-03-24 DIAGNOSIS — R82998 Other abnormal findings in urine: Secondary | ICD-10-CM | POA: Diagnosis not present

## 2022-03-24 DIAGNOSIS — C50912 Malignant neoplasm of unspecified site of left female breast: Secondary | ICD-10-CM | POA: Diagnosis not present

## 2022-03-24 DIAGNOSIS — E039 Hypothyroidism, unspecified: Secondary | ICD-10-CM | POA: Diagnosis not present

## 2022-03-24 DIAGNOSIS — I83813 Varicose veins of bilateral lower extremities with pain: Secondary | ICD-10-CM | POA: Diagnosis not present

## 2022-03-29 ENCOUNTER — Ambulatory Visit
Admission: RE | Admit: 2022-03-29 | Discharge: 2022-03-29 | Disposition: A | Discharge status: Home / Self Care | Payer: PPO | Source: Ambulatory Visit | Attending: Orthopedic Surgery | Admitting: Orthopedic Surgery

## 2022-03-29 DIAGNOSIS — K449 Diaphragmatic hernia without obstruction or gangrene: Secondary | ICD-10-CM | POA: Diagnosis not present

## 2022-03-29 DIAGNOSIS — R222 Localized swelling, mass and lump, trunk: Secondary | ICD-10-CM | POA: Diagnosis not present

## 2022-03-29 MED ORDER — GADOBENATE DIMEGLUMINE 529 MG/ML IV SOLN
18.0000 mL | Freq: Once | INTRAVENOUS | Status: AC | PRN
  Administered 2022-03-29: 18 mL via INTRAVENOUS

## 2022-04-09 ENCOUNTER — Encounter: Payer: Self-pay | Admitting: Orthopedic Surgery

## 2022-04-09 ENCOUNTER — Ambulatory Visit: Payer: PPO | Admitting: Orthopedic Surgery

## 2022-04-09 ENCOUNTER — Other Ambulatory Visit: Payer: Self-pay | Admitting: Orthopedic Surgery

## 2022-04-09 DIAGNOSIS — R222 Localized swelling, mass and lump, trunk: Secondary | ICD-10-CM

## 2022-04-09 NOTE — Progress Notes (Signed)
Office Visit Note   Patient: [name]           Date of Birth: [birth year]           MRN: [id number] Visit Date: 04/09/2022 Requested by: Haywood Pao, MD [address] [place],  [place] [address] PCP: Osborne Casco Fransico Him, MD  Subjective: Chief Complaint  Patient presents with   Other    Scan review    HPI: [name] is a 66 year old patient here to review MRI scan of her left chest wall for enlarging lipoma.  MRI scan is reviewed with the patient.  She does have a discrete mass and they are measuring about 4 x 5 cm but this is not well demonstrated on the MRI scan.  She still is having back problems and went from osteoporosis to osteopenia recently.  Does have a known history of compression fractures in her back.  Taking tizanidine and Relafen.  Thinking about having the lipoma removed from her left breast area.  She has a history of breast cancer in that region.              ROS: All systems reviewed are negative as they relate to the chief complaint within the history of present illness.  Patient denies  fevers or chills.   Assessment & Plan: Visit Diagnoses:  1. Nodule of left anterior chest wall     Plan: Impression is palpable lipoma in the left anterior proximal breast region.  Freely mobile but not well demonstrated on MRI scan.  Recommend referral to Dr. Serita Grammes for further evaluation and management.  Relevant enzymes and anemia refilled.  Follow-up with me as needed.  I think with her history of osteoarthritis and compression fractures in the lumbar spine she may be a candidate for aqua therapy to do some loadbearing exercises which do not stress her back very much.  Follow-up with me as needed  Follow-Up Instructions: Return if symptoms worsen or fail to improve.   Orders:  No orders of the defined types were placed in this encounter.  No orders of the defined types were placed in this encounter.     Procedures: No procedures  performed   Clinical Data: No additional findings.  Objective: Vital Signs: There were no vitals taken for this visit.  Physical Exam:   Constitutional: Patient appears well-developed HEENT:  Head: Normocephalic Eyes:EOM are normal Neck: Normal range of motion Cardiovascular: Normal rate Pulmonary/chest: Effort normal Neurologic: Patient is alert Skin: Skin is warm Psychiatric: Patient has normal mood and affect   Ortho Exam:Ortho exam demonstrates palpable left breast mass measuring 4 x 5 cm.  Freely mobile and not fixed to underlying tissue.  No axillary lymphadenopathy.  This is definitely asymmetric left versus right chest wall region.  Gait is normal.  Specialty Comments:  No specialty comments available.  Imaging: No results found.   PMFS History: Patient Active Problem List   Diagnosis Date Noted   Abscess of vulva 11/15/2018   Anxiety 11/15/2018   Insomnia 11/15/2018   Chronic diastolic heart failure ([place]) 04/18/2018   Benign essential HTN 04/18/2018   Congestive heart failure ([place]) 03/23/2018   Mild dilation of ascending aorta (HCC)    Elevated blood pressure reading without diagnosis of hypertension    Abnormal EKG 02/07/2016   Edema extremities 02/07/2016   Joint pain 03/19/2014   Paroxysmal supraventricular tachycardia ([place]) 12/22/2013   Breast cancer of upper-outer quadrant of left female breast ([place]) 09/19/2013  Vaginal atrophy 08/18/2013   Vaginitis and vulvovaginitis 08/18/2013   Pain, pelvic, female 08/18/2013   Hypothyroidism, postsurgical 04/03/2013   Past Medical History:  Diagnosis Date   Anxiety    Arthritis    lower back and thumbs   Breast cancer of upper-outer quadrant of left female breast ([place]) 09/19/2013   Cancer ([place])    left breast-(12-12-09 radiation and surgery)-Dr. Jana Hakim -oncology   Chronic diastolic CHF (congestive heart failure) (HCC)    Colon polyps    sm aden p 10/06 (difficult exam, needs propofol);neg 07/2010    Dyslipidemia    Edema extremities 02/07/2016   GERD (gastroesophageal reflux disease)    [facility] [facility]), neg for metaplasia '97, '08 (mod HH) 9.2011   HTN (hypertension)    Hypothyroidism, postsurgical 04/03/2013   Laryngitis 12-20-12   recent episode is resolving with Amoxicillin orally   Mild dilation of ascending aorta (HCC)    Obesity    Osteoporosis    Paroxysmal supraventricular tachycardia ([place]) 12/22/2013   Personal history of radiation therapy    2011   PONV (postoperative nausea and vomiting)    Thyroid adenoma 12-20-12   surgery planned 12-30-12   Vaginal atrophy 08/18/2013   Vaginitis and vulvovaginitis 08/18/2013    Family History  Problem Relation Age of Onset   Stroke Mother    Heart disease Father    Cancer Brother        melanoma    Past Surgical History:  Procedure Laterality Date   BREAST LUMPECTOMY Left    2011   BREAST SURGERY     left breast lumpectomy   EYE SURGERY  12-20-12   right eye retina tear repair   THYROIDECTOMY N/A 12/30/2012   Procedure: THYROIDECTOMY;  Surgeon: Earnstine Regal, MD;  Location: [facility];  Service: General;  Laterality: N/A;   Social History   Occupational History   Not on file  Tobacco Use   Smoking status: Former    Types: Cigarettes    Quit date: 01/31/2010    Years since quitting: 12.1   Smokeless tobacco: Never  Vaping Use   Vaping Use: Never used  Substance and Sexual Activity   Alcohol use: Yes    Comment: social- occ   Drug use: No   Sexual activity: Yes    Birth control/protection: Post-menopausal

## 2022-04-19 ENCOUNTER — Other Ambulatory Visit: Payer: Self-pay | Admitting: Surgical

## 2022-04-19 DIAGNOSIS — M79604 Pain in right leg: Secondary | ICD-10-CM

## 2022-04-21 ENCOUNTER — Telehealth: Payer: Self-pay | Admitting: Orthopedic Surgery

## 2022-04-21 DIAGNOSIS — M545 Low back pain, unspecified: Secondary | ICD-10-CM

## 2022-04-21 NOTE — Telephone Encounter (Signed)
Patient called advised she is ready to be referred to Dr. Ernestina Patches for her back. The number to contact patient is [phone]

## 2022-04-21 NOTE — Telephone Encounter (Signed)
Yes.  Neuroforaminal impingement from T12 compression fracture in the lumbar spine.  That would be the indication.  Thanks.  Compression fracture is chronic.

## 2022-04-22 ENCOUNTER — Telehealth: Payer: Self-pay | Admitting: Physical Medicine and Rehabilitation

## 2022-04-22 NOTE — Telephone Encounter (Signed)
Pt returned call to [facility] for a referral appt. Please call pt at [phone].

## 2022-04-22 NOTE — Telephone Encounter (Signed)
LMVM advising order placed for ESI

## 2022-04-27 DIAGNOSIS — L400 Psoriasis vulgaris: Secondary | ICD-10-CM | POA: Diagnosis not present

## 2022-04-28 ENCOUNTER — Telehealth: Payer: Self-pay | Admitting: Physical Medicine and Rehabilitation

## 2022-04-30 ENCOUNTER — Encounter: Payer: Self-pay | Admitting: Physical Medicine and Rehabilitation

## 2022-04-30 ENCOUNTER — Ambulatory Visit (INDEPENDENT_AMBULATORY_CARE_PROVIDER_SITE_OTHER): Payer: PPO | Admitting: Physical Medicine and Rehabilitation

## 2022-04-30 DIAGNOSIS — G8929 Other chronic pain: Secondary | ICD-10-CM

## 2022-04-30 DIAGNOSIS — M545 Low back pain, unspecified: Secondary | ICD-10-CM | POA: Diagnosis not present

## 2022-04-30 DIAGNOSIS — M47816 Spondylosis without myelopathy or radiculopathy, lumbar region: Secondary | ICD-10-CM | POA: Diagnosis not present

## 2022-04-30 NOTE — Progress Notes (Signed)
[name] - 66 y.o. female MRN [id number]  Date of birth: [birth year]  Virtual Visit via Telephone Note:  I connected with [name] 04/30/2022 at 10:15 AM EDT by telephone and verified that I am speaking with the correct person using two identifiers.   I discussed the limitations, risks, security and privacy concerns of performing an evaluation and management service by telephone and the availability of in person appointments. I also discussed with the patient that there may be a patient responsible charge related to this service. The patient expressed understanding and agreed to proceed.  Visit Date: 04/30/2022 PCP: Haywood Pao, MD Referred by: Haywood Pao, MD  Subjective: No chief complaint on file.  HPI: [name] is a 66 y.o. female. at the request of Dr. Alphonzo Severance for evaluation of chronic, worsening and severe bilateral lower back pain. Patient reports pain has been ongoing for several years and is exacerbated by prolonged sitting and changes in routine such as use of new pillows and new tennis shoes.  She describes her pain as a sore, stiff and aching sensation, currently rates as 7 out of 10.  Patient states her pain is more severe in the mornings after waking up and does tend to improve with movement and activity as her day progresses.  Some relief of pain with home exercise regimen, rest, use of ice and medications.  Patient states she does attend [employer] in [place] multiple times a week where she participates in water aerobics.  Patient's recent lumbar MRI exhibits multilevel degenerate disc disease most prominent at L4-L5 and L5-S1 where there is moderate facet joint arthropathy. There is also mild bilateral foraminal stenosis at the level of L5-S1. No high grade spinal canal stenosis noted. Patient had no history of lumbar surgery/lumbar injections. Patient denies focal weakness, numbness and  tingling. Patient denies recent trama or falls.    Review of Systems  Musculoskeletal:  Positive for back pain.  Neurological:  Negative for tingling, sensory change, focal weakness and weakness.  All other systems reviewed and are negative.  Otherwise per HPI.  Objective/Observation:  Assessment & Plan: Visit Diagnoses:  1. Chronic bilateral low back pain without sciatica   2. Spondylosis without myelopathy or radiculopathy, lumbar region   3. Facet hypertrophy of lumbar region     Plan: Findings:  Chronic, worsening and severe bilateral axial back pain. No radicular symptoms. Patient continues to have severe pain despite good conservative therapy such as home exercise regimen, rest, use of ice and medications.  Patient's clinical presentation is consistent with facet mediated pain.  She does have moderate facet arthropathy at the levels of L4-L5 and L5-S1 on recent lumbar MRI imaging.  We believe the next step is to perform diagnostic and hopefully therapeutic bilateral L4-L5 and L5-S1 facet joint/medial branch blocks under fluoroscopic guidance.  If patient gets significant pain relief with diagnostic facet injections we did discuss the possibility of longer sustained pain relief with radiofrequency ablation procedure.  I did discuss facet joint injection procedure in detail with patient today, she has no further questions at this time.  Patient does voice anxiety related to procedure, currently taking Xanax as needed, I instructed her to take this medication 1 hour prior to lumbar injection procedure.  Patient did not voice any red flag/concerning symptoms during our telephone visit today.  I did instruct her to contact us if she has any further questions or concerns.  Meds & Orders: No orders of the defined types were placed in this encounter.  No orders of the defined types were placed in this encounter.   Follow-up: Return for Bilateral L4-L5 and L5-S1 facet joint/medial branch  blocks.  Follow Up Instructions:    I discussed the assessment and treatment plan with the patient. The patient was provided an opportunity to ask questions and all were answered. The patient agreed with the plan and demonstrated an understanding of the instructions.   The patient was advised to call back or seek an in-person evaluation if the symptoms worsen or if the condition fails to improve as anticipated.  I provided 25 minutes of non-face-to-face time during this encounter.   Lorine Bears, NP    Clinical History: MRI LUMBAR SPINE WITHOUT CONTRAST   TECHNIQUE: Multiplanar, multisequence MR imaging of the lumbar spine was performed. No intravenous contrast was administered.   COMPARISON:  Radiographs dated December 31, 2021   FINDINGS: Segmentation:  Standard.   Alignment: Mild acute kyphotic angulation at T11-T12 due to compression fracture of T12. Mild anterolisthesis of L4.   Vertebrae: Compression deformity of T12 vertebral body with approximately 50% vertebral body height loss, without evidence of edema likely a chronic process.   Conus medullaris and cauda equina: Conus extends to the L1-L2 level. Conus and cauda equina appear normal.   Paraspinal and other soft tissues: Nonspecific subcutaneous soft tissue edema.   Disc spaces:   T11-T12: Disc height loss with disc protrusion into the central canal. Mild central canal narrowing. No significant neural foraminal narrowing. Mild facet joint arthropathy.   T12-L1: No significant disc bulge. No neural foraminal stenosis. No central canal stenosis.   L1-L2: No significant disc bulge. No neural foraminal stenosis. No central canal stenosis.   L2-L3: No significant disc bulge. No neural foraminal stenosis. No central canal stenosis. Bilateral facet joint arthropathy, left worse than the right.   L3-L4: Mild disc protrusion with mild narrowing of lateral recesses bilaterally. Mild ligamentum flavum  hypertrophy. Moderate facet joint arthropathy. No significant neural foraminal stenosis.   L4-L5: Moderate circumferential disc protrusion and ligamentum flavum hypertrophy. Mild narrowing of lateral recesses bilaterally. Moderate facet joint arthropathy. No significant neural foraminal stenosis.   L5-S1: Disc height loss and broad-base disc protrusion. Narrowing of bilateral lateral recesses. Moderate facet joint hypertrophy. Mild bilateral neural foraminal stenosis, right worse than the left.   IMPRESSION: 1. Compression deformity of T12 vertebral body with approximately 50% vertebral body height loss, likely a chronic process.   2. Multilevel degenerate disc disease most prominent at L4-L5 and L5-S1 with mild bilateral neural foraminal stenosis at L5-S1.     Electronically Signed   By: Keane Police D.O.   On: 01/05/2022 09:00   She reports that she quit smoking about 12 years ago. Her smoking use included cigarettes. She has never used smokeless tobacco. No results for input(s): "HGBA1C", "LABURIC" in the last 8760 hours.  Past Medical/Family/Surgical/Social History: Medications & Allergies reviewed per EMR, new medications updated. Patient Active Problem List   Diagnosis Date Noted   Abscess of vulva 11/15/2018   Anxiety 11/15/2018   Insomnia 11/15/2018   Chronic diastolic heart failure ([facility]) 04/18/2018   Benign essential HTN 04/18/2018   Congestive heart failure ([facility]) 03/23/2018   Mild dilation of ascending aorta ([facility])    Elevated blood pressure reading without diagnosis of hypertension    Abnormal EKG 02/07/2016   Edema extremities 02/07/2016   Joint pain 03/19/2014   Paroxysmal supraventricular  tachycardia ([facility] [place]) 12/22/2013   Breast cancer of upper-outer quadrant of left female breast ([facility]) 09/19/2013   Vaginal atrophy 08/18/2013   Vaginitis and vulvovaginitis 08/18/2013   Pain, pelvic, female 08/18/2013   Hypothyroidism, postsurgical 04/03/2013   Past Medical  History:  Diagnosis Date   Anxiety    Arthritis    lower back and thumbs   Breast cancer of upper-outer quadrant of left female breast ([facility]) 09/19/2013   Cancer ([facility] [place])    left breast-(12-12-09 radiation and surgery)-Dr. Jana Hakim -oncology   Chronic diastolic CHF (congestive heart failure) ([facility])    Colon polyps    sm aden p 10/06 (difficult exam, needs propofol);neg 07/2010   Dyslipidemia    Edema extremities 02/07/2016   GERD (gastroesophageal reflux disease)    [facility]), neg for metaplasia '97, '08 (mod HH) 9.2011   HTN (hypertension)    Hypothyroidism, postsurgical 04/03/2013   Laryngitis 12-20-12   recent episode is resolving with Amoxicillin orally   Mild dilation of ascending aorta ([facility]CC)    Obesity    Osteoporosis    Paroxysmal supraventricular tachycardia ([place]) 12/22/2013   Personal history of radiation therapy    2011   PONV (postoperative nausea and vomiting)    Thyroid adenoma 12-20-12   surgery planned 12-30-12   Vaginal atrophy 08/18/2013   Vaginitis and vulvovaginitis 08/18/2013   Family History  Problem Relation Age of Onset   Stroke Mother    Heart disease Father    Cancer Brother        melanoma   Past Surgical History:  Procedure Laterality Date   BREAST LUMPECTOMY Left    2011   BREAST SURGERY     left breast lumpectomy   EYE SURGERY  12-20-12   right eye retina tear repair   THYROIDECTOMY N/A 12/30/2012   Procedure: THYROIDECTOMY;  Surgeon: Earnstine Regal, MD;  Location: [facility];  Service: General;  Laterality: N/A;   Social History   Occupational History   Not on file  Tobacco Use   Smoking status: Former    Types: Cigarettes    Quit date: 01/31/2010    Years since quitting: 12.2   Smokeless tobacco: Never  Vaping Use   Vaping Use: Never used  Substance and Sexual Activity   Alcohol use: Yes    Comment: social- occ   Drug use: No   Sexual activity: Yes    Birth control/protection: Post-menopausal

## 2022-05-13 ENCOUNTER — Telehealth: Payer: Self-pay | Admitting: Cardiology

## 2022-05-13 DIAGNOSIS — D171 Benign lipomatous neoplasm of skin and subcutaneous tissue of trunk: Secondary | ICD-10-CM | POA: Diagnosis not present

## 2022-05-13 NOTE — Telephone Encounter (Signed)
Called pt and left message informing pt that her medications were already sent to her pharmacy. I called the pharmacy and they stated that pt's medications were ready for pt to pick up. I advised the pt of this and advised the pt that if she has any other problems, questions or concerns, to give our office a call back.

## 2022-05-13 NOTE — Telephone Encounter (Signed)
 *  STAT* If patient is at the pharmacy, call can be transferred to refill team.   1. Which medications need to be refilled? (please list name of each medication and dose if known) diltiazem (CARDIZEM CD) 120 MG 24 hr capsule furosemide (LASIX) 20 MG tablet  2. Which pharmacy/location (including street and city if local pharmacy) is medication to be sent to? [place], [place] - [address]  3. Do they need a 30 day or 90 day supply? 90 days  Pt is out of meds and needs refill today

## 2022-05-19 ENCOUNTER — Encounter: Payer: Self-pay | Admitting: Physical Medicine and Rehabilitation

## 2022-06-01 ENCOUNTER — Encounter: Payer: Self-pay | Admitting: Physical Medicine and Rehabilitation

## 2022-06-01 ENCOUNTER — Ambulatory Visit: Payer: Self-pay

## 2022-06-01 ENCOUNTER — Ambulatory Visit: Payer: PPO | Admitting: Physical Medicine and Rehabilitation

## 2022-06-01 VITALS — BP 168/73 | HR 90

## 2022-06-01 DIAGNOSIS — E7800 Pure hypercholesterolemia, unspecified: Secondary | ICD-10-CM | POA: Diagnosis not present

## 2022-06-01 DIAGNOSIS — M47816 Spondylosis without myelopathy or radiculopathy, lumbar region: Secondary | ICD-10-CM

## 2022-06-01 DIAGNOSIS — F419 Anxiety disorder, unspecified: Secondary | ICD-10-CM | POA: Diagnosis not present

## 2022-06-01 MED ORDER — BUPIVACAINE HCL 0.5 % IJ SOLN
3.0000 mL | Freq: Once | INTRAMUSCULAR | Status: AC
  Administered 2022-06-01: 3 mL

## 2022-06-01 NOTE — Progress Notes (Unsigned)
Pt state lower back pain. Pt state sitting and bending makes the pain worse. Pt state she takes over the counter pain meds to help ease her pain.  Numeric Pain Rating Scale and Functional Assessment Average Pain 1   In the last MONTH (on 0-10 scale) has pain interfered with the following?  1. General activity like being  able to carry out your everyday physical activities such as walking, climbing stairs, carrying groceries, or moving a chair?  Rating(4)   +Driver, -BT, -Dye Allergies.

## 2022-06-01 NOTE — Patient Instructions (Signed)

## 2022-06-02 NOTE — Progress Notes (Signed)
[name] - 66 y.o. female MRN [id number]  Date of birth: 12-Apr-1956  Office Visit Note: Visit Date: 06/01/2022 PCP: Haywood Pao, MD Referred by: Haywood Pao, MD  Subjective: Chief Complaint  Patient presents with   Lower Back - Pain   HPI:  [name] Medico is a 66 y.o. female who comes in today at the request of Barnet Pall, FNP for planned Bilateral L4-5 and L5-S1 lumbar facet/medial branch block with fluoroscopic guidance.  The patient has failed conservative care including home exercise, medications, time and activity modification.  This injection will be diagnostic and hopefully therapeutic.  Please see requesting physician notes for further details and justification.  Exam has shown concordant pain with facet joint loading.   ROS Otherwise per HPI.  Assessment & Plan: Visit Diagnoses:    ICD-10-CM   1. Spondylosis without myelopathy or radiculopathy, lumbar region  M47.816 XR C-ARM NO REPORT    Facet Injection    bupivacaine (MARCAINE) 0.5 % (with pres) injection 3 mL      Plan: No additional findings.   Meds & Orders:  Meds ordered this encounter  Medications   bupivacaine (MARCAINE) 0.5 % (with pres) injection 3 mL    Orders Placed This Encounter  Procedures   Facet Injection   XR C-ARM NO REPORT    Follow-up: Return for Review Pain Diary.   Procedures: No procedures performed  Lumbar Diagnostic Facet Joint Nerve Block with Fluoroscopic Guidance   Patient: [name]      Date of Birth: 02-17-1956 MRN: [id number] PCP: Haywood Pao, MD      Visit Date: 06/01/2022   Universal Protocol:    Date/Time: 08/01/237:40 AM  Consent Given By: the patient  Position: PRONE  Additional Comments: Vital signs were monitored before and after the procedure. Patient was prepped and draped in the usual sterile fashion. The correct patient, procedure, and site was verified.   Injection Procedure  Details:   Procedure diagnoses:  1. Spondylosis without myelopathy or radiculopathy, lumbar region      Meds Administered:  Meds ordered this encounter  Medications   bupivacaine (MARCAINE) 0.5 % (with pres) injection 3 mL     Laterality: Bilateral  Location/Site: L4-L5, L3 and L4 medial branches and L5-S1, L4 medial branch and L5 dorsal ramus  Needle: 5.0 in., 25 ga.  Short bevel or Quincke spinal needle  Needle Placement: Oblique pedical  Findings:   -Comments: There was excellent flow of contrast along the articular pillars without intravascular flow.  Procedure Details: The fluoroscope beam is vertically oriented in AP and then obliqued 15 to 20 degrees to the ipsilateral side of the desired nerve to achieve the "Scotty dog" appearance.  The skin over the target area of the junction of the superior articulating process and the transverse process (sacral ala if blocking the L5 dorsal rami) was locally anesthetized with a 1 ml volume of 1% Lidocaine without Epinephrine.  The spinal needle was inserted and advanced in a trajectory view down to the target.   After contact with periosteum and negative aspirate for blood and CSF, correct placement without intravascular or epidural spread was confirmed by injecting 0.5 ml. of Isovue-250.  A spot radiograph was obtained of this image.    Next, a 0.5 ml. volume of the injectate described above was injected. The needle was then redirected to the other facet joint nerves mentioned above if needed.  Prior to the  procedure, the patient was given a Pain Diary which was completed for baseline measurements.  After the procedure, the patient rated their pain every 30 minutes and will continue rating at this frequency for a total of 5 hours.  The patient has been asked to complete the Diary and return to [place] by mail, fax or hand delivered as soon as possible.   Additional Comments:  The patient tolerated the procedure well Dressing: 2 x 2  sterile gauze and Band-Aid    Post-procedure details: Patient was observed during the procedure. Post-procedure instructions were reviewed.  Patient left the clinic in stable condition.   Clinical History: MRI LUMBAR SPINE WITHOUT CONTRAST   TECHNIQUE: Multiplanar, multisequence MR imaging of the lumbar spine was performed. No intravenous contrast was administered.   COMPARISON:  Radiographs dated December 31, 2021   FINDINGS: Segmentation:  Standard.   Alignment: Mild acute kyphotic angulation at T11-T12 due to compression fracture of T12. Mild anterolisthesis of L4.   Vertebrae: Compression deformity of T12 vertebral body with approximately 50% vertebral body height loss, without evidence of edema likely a chronic process.   Conus medullaris and cauda equina: Conus extends to the L1-L2 level. Conus and cauda equina appear normal.   Paraspinal and other soft tissues: Nonspecific subcutaneous soft tissue edema.   Disc spaces:   T11-T12: Disc height loss with disc protrusion into the central canal. Mild central canal narrowing. No significant neural foraminal narrowing. Mild facet joint arthropathy.   T12-L1: No significant disc bulge. No neural foraminal stenosis. No central canal stenosis.   L1-L2: No significant disc bulge. No neural foraminal stenosis. No central canal stenosis.   L2-L3: No significant disc bulge. No neural foraminal stenosis. No central canal stenosis. Bilateral facet joint arthropathy, left worse than the right.   L3-L4: Mild disc protrusion with mild narrowing of lateral recesses bilaterally. Mild ligamentum flavum hypertrophy. Moderate facet joint arthropathy. No significant neural foraminal stenosis.   L4-L5: Moderate circumferential disc protrusion and ligamentum flavum hypertrophy. Mild narrowing of lateral recesses bilaterally. Moderate facet joint arthropathy. No significant neural foraminal stenosis.   L5-S1: Disc height loss and  broad-base disc protrusion. Narrowing of bilateral lateral recesses. Moderate facet joint hypertrophy. Mild bilateral neural foraminal stenosis, right worse than the left.   IMPRESSION: 1. Compression deformity of T12 vertebral body with approximately 50% vertebral body height loss, likely a chronic process.   2. Multilevel degenerate disc disease most prominent at L4-L5 and L5-S1 with mild bilateral neural foraminal stenosis at L5-S1.     Electronically Signed   By: Keane Police D.O.   On: 01/05/2022 09:00     Objective:  VS:  HT:    WT:   BMI:     BP:(!) 168/73  HR:90bpm  TEMP: ( )  RESP:  Physical Exam Vitals and nursing note reviewed.  Constitutional:      General: She is not in acute distress.    Appearance: Normal appearance. She is not ill-appearing.  HENT:     Head: Normocephalic and atraumatic.     Right Ear: External ear normal.     Left Ear: External ear normal.  Eyes:     Extraocular Movements: Extraocular movements intact.  Cardiovascular:     Rate and Rhythm: Normal rate.     Pulses: Normal pulses.  Pulmonary:     Effort: Pulmonary effort is normal. No respiratory distress.  Abdominal:     General: There is no distension.     Palpations: Abdomen is soft.  Musculoskeletal:        General: Tenderness present.     Cervical back: Neck supple.     Right lower leg: No edema.     Left lower leg: No edema.     Comments: Patient has good distal strength with no pain over the greater trochanters.  No clonus or focal weakness.  Skin:    Findings: No erythema, lesion or rash.  Neurological:     General: No focal deficit present.     Mental Status: She is alert and oriented to person, place, and time.     Sensory: No sensory deficit.     Motor: No weakness or abnormal muscle tone.     Coordination: Coordination normal.  Psychiatric:        Mood and Affect: Mood normal.        Behavior: Behavior normal.      Imaging: XR C-ARM NO REPORT  Result Date:  06/01/2022 Please see Notes tab for imaging impression.

## 2022-06-02 NOTE — Procedures (Signed)
Lumbar Diagnostic Facet Joint Nerve Block with Fluoroscopic Guidance   Patient: [name]      Date of Birth: Jun 27, 1956 MRN: [id number] PCP: Haywood Pao, MD      Visit Date: 06/01/2022   Universal Protocol:    Date/Time: 08/01/237:40 AM  Consent Given By: the patient  Position: PRONE  Additional Comments: Vital signs were monitored before and after the procedure. Patient was prepped and draped in the usual sterile fashion. The correct patient, procedure, and site was verified.   Injection Procedure Details:   Procedure diagnoses:  1. Spondylosis without myelopathy or radiculopathy, lumbar region      Meds Administered:  Meds ordered this encounter  Medications   bupivacaine (MARCAINE) 0.5 % (with pres) injection 3 mL     Laterality: Bilateral  Location/Site: L4-L5, L3 and L4 medial branches and L5-S1, L4 medial branch and L5 dorsal ramus  Needle: 5.0 in., 25 ga.  Short bevel or Quincke spinal needle  Needle Placement: Oblique pedical  Findings:   -Comments: There was excellent flow of contrast along the articular pillars without intravascular flow.  Procedure Details: The fluoroscope beam is vertically oriented in AP and then obliqued 15 to 20 degrees to the ipsilateral side of the desired nerve to achieve the "Scotty dog" appearance.  The skin over the target area of the junction of the superior articulating process and the transverse process (sacral ala if blocking the L5 dorsal rami) was locally anesthetized with a 1 ml volume of 1% Lidocaine without Epinephrine.  The spinal needle was inserted and advanced in a trajectory view down to the target.   After contact with periosteum and negative aspirate for blood and CSF, correct placement without intravascular or epidural spread was confirmed by injecting 0.5 ml. of Isovue-250.  A spot radiograph was obtained of this image.    Next, a 0.5 ml. volume of the injectate described above was  injected. The needle was then redirected to the other facet joint nerves mentioned above if needed.  Prior to the procedure, the patient was given a Pain Diary which was completed for baseline measurements.  After the procedure, the patient rated their pain every 30 minutes and will continue rating at this frequency for a total of 5 hours.  The patient has been asked to complete the Diary and return to [place] by mail, fax or hand delivered as soon as possible.   Additional Comments:  The patient tolerated the procedure well Dressing: 2 x 2 sterile gauze and Band-Aid    Post-procedure details: Patient was observed during the procedure. Post-procedure instructions were reviewed.  Patient left the clinic in stable condition.

## 2022-06-13 ENCOUNTER — Encounter: Payer: Self-pay | Admitting: Physical Medicine and Rehabilitation

## 2022-07-09 ENCOUNTER — Telehealth: Payer: Self-pay | Admitting: Physical Medicine and Rehabilitation

## 2022-07-09 NOTE — Telephone Encounter (Signed)
Pt called requesting an appt. Please call pt at [phone].

## 2022-07-21 ENCOUNTER — Other Ambulatory Visit: Payer: Self-pay | Admitting: Internal Medicine

## 2022-07-21 DIAGNOSIS — Z1231 Encounter for screening mammogram for malignant neoplasm of breast: Secondary | ICD-10-CM

## 2022-07-28 DIAGNOSIS — K219 Gastro-esophageal reflux disease without esophagitis: Secondary | ICD-10-CM | POA: Diagnosis not present

## 2022-07-28 DIAGNOSIS — Z86010 Personal history of colonic polyps: Secondary | ICD-10-CM | POA: Diagnosis not present

## 2022-08-10 ENCOUNTER — Ambulatory Visit: Payer: PPO | Admitting: Physical Medicine and Rehabilitation

## 2022-08-10 ENCOUNTER — Encounter: Payer: Self-pay | Admitting: Physical Medicine and Rehabilitation

## 2022-08-10 ENCOUNTER — Ambulatory Visit: Payer: Self-pay

## 2022-08-10 VITALS — BP 164/77 | HR 119

## 2022-08-10 DIAGNOSIS — M47816 Spondylosis without myelopathy or radiculopathy, lumbar region: Secondary | ICD-10-CM

## 2022-08-10 MED ORDER — BUPIVACAINE HCL 0.5 % IJ SOLN
3.0000 mL | Freq: Once | INTRAMUSCULAR | Status: AC
  Administered 2022-08-10: 3 mL

## 2022-08-10 NOTE — Patient Instructions (Signed)

## 2022-08-10 NOTE — Progress Notes (Signed)
Numeric Pain Rating Scale and Functional Assessment Average Pain 4   In the last MONTH (on 0-10 scale) has pain interfered with the following?  1. General activity like being  able to carry out your everyday physical activities such as walking, climbing stairs, carrying groceries, or moving a chair?  Rating(6). The next morning after is when she hurts the most.    +Driver, +BT. Baby aspirin, -Dye Allergies.  She has more pain with sitting. Pain level 5. Pain into both hips and into both legs below the buttock. Same in both lower extremities. No numbness or tingling.

## 2022-08-11 ENCOUNTER — Other Ambulatory Visit: Payer: Self-pay | Admitting: Physical Medicine and Rehabilitation

## 2022-08-11 DIAGNOSIS — M545 Low back pain, unspecified: Secondary | ICD-10-CM

## 2022-08-11 DIAGNOSIS — M47816 Spondylosis without myelopathy or radiculopathy, lumbar region: Secondary | ICD-10-CM

## 2022-08-13 ENCOUNTER — Ambulatory Visit
Admission: RE | Admit: 2022-08-13 | Discharge: 2022-08-13 | Disposition: A | Discharge status: Home / Self Care | Payer: PPO | Source: Ambulatory Visit | Attending: Internal Medicine | Admitting: Internal Medicine

## 2022-08-13 DIAGNOSIS — Z1231 Encounter for screening mammogram for malignant neoplasm of breast: Secondary | ICD-10-CM | POA: Diagnosis not present

## 2022-08-18 NOTE — Progress Notes (Signed)
[name] - 65 y.o. female MRN [id number]  Date of birth: 07-04-1956  Office Visit Note: Visit Date: 08/10/2022 PCP: Haywood Pao, MD Referred by: Haywood Pao, MD  Subjective: Chief Complaint  Patient presents with   Lower Back - Pain   HPI:  [name] is a 66 y.o. female who comes in today for planned repeat Bilateral L4-5 and L5-S1 Lumbar facet/medial branch block with fluoroscopic guidance.  The patient has failed conservative care including home exercise, medications, time and activity modification.  This injection will be diagnostic and hopefully therapeutic.  Please see requesting physician notes for further details and justification.  Exam shows concordant low back pain with facet joint loading and extension. Patient received more than 80% pain relief from prior injection. This would be the second block in a diagnostic double block paradigm.     Referring:Megan Jimmye Norman, FNP   ROS Otherwise per HPI.  Assessment & Plan: Visit Diagnoses:    ICD-10-CM   1. Spondylosis without myelopathy or radiculopathy, lumbar region  M47.816 XR C-ARM NO REPORT    Facet Injection    bupivacaine (MARCAINE) 0.5 % (with pres) injection 3 mL      Plan: No additional findings.   Meds & Orders:  Meds ordered this encounter  Medications   bupivacaine (MARCAINE) 0.5 % (with pres) injection 3 mL    Orders Placed This Encounter  Procedures   Facet Injection   XR C-ARM NO REPORT    Follow-up: Return for Review Pain Diary.   Procedures: No procedures performed  Lumbar Diagnostic Facet Joint Nerve Block with Fluoroscopic Guidance   Patient: [name]      Date of Birth: [birth year] MRN: [id number] PCP: Haywood Pao, MD      Visit Date: 08/10/2022   Universal Protocol:    Date/Time: 10/17/238:27 AM  Consent Given By: the patient  Position: PRONE  Additional Comments: Vital signs were monitored before and after  the procedure. Patient was prepped and draped in the usual sterile fashion. The correct patient, procedure, and site was verified.   Injection Procedure Details:   Procedure diagnoses:  1. Spondylosis without myelopathy or radiculopathy, lumbar region      Meds Administered:  Meds ordered this encounter  Medications   bupivacaine (MARCAINE) 0.5 % (with pres) injection 3 mL     Laterality: Bilateral  Location/Site: L4-L5, L3 and L4 medial branches and L5-S1, L4 medial branch and L5 dorsal ramus  Needle: 5.0 in., 25 ga.  Short bevel or Quincke spinal needle  Needle Placement: Oblique pedical  Findings:   -Comments: There was excellent flow of contrast along the articular pillars without intravascular flow.  Procedure Details: The fluoroscope beam is vertically oriented in AP and then obliqued 15 to 20 degrees to the ipsilateral side of the desired nerve to achieve the "Scotty dog" appearance.  The skin over the target area of the junction of the superior articulating process and the transverse process (sacral ala if blocking the L5 dorsal rami) was locally anesthetized with a 1 ml volume of 1% Lidocaine without Epinephrine.  The spinal needle was inserted and advanced in a trajectory view down to the target.   After contact with periosteum and negative aspirate for blood and CSF, correct placement without intravascular or epidural spread was confirmed by injecting 0.5 ml. of Isovue-250.  A spot radiograph was obtained of this image.    Next, a 0.5 ml. volume  of the injectate described above was injected. The needle was then redirected to the other facet joint nerves mentioned above if needed.  Prior to the procedure, the patient was given a Pain Diary which was completed for baseline measurements.  After the procedure, the patient rated their pain every 30 minutes and will continue rating at this frequency for a total of 5 hours.  The patient has been asked to complete the Diary and  return to [place] by mail, fax or hand delivered as soon as possible.   Additional Comments:  The patient tolerated the procedure well Dressing: 2 x 2 sterile gauze and Band-Aid    Post-procedure details: Patient was observed during the procedure. Post-procedure instructions were reviewed.  Patient left the clinic in stable condition.   Clinical History: MRI LUMBAR SPINE WITHOUT CONTRAST   TECHNIQUE: Multiplanar, multisequence MR imaging of the lumbar spine was performed. No intravenous contrast was administered.   COMPARISON:  Radiographs dated December 31, 2021   FINDINGS: Segmentation:  Standard.   Alignment: Mild acute kyphotic angulation at T11-T12 due to compression fracture of T12. Mild anterolisthesis of L4.   Vertebrae: Compression deformity of T12 vertebral body with approximately 50% vertebral body height loss, without evidence of edema likely a chronic process.   Conus medullaris and cauda equina: Conus extends to the L1-L2 level. Conus and cauda equina appear normal.   Paraspinal and other soft tissues: Nonspecific subcutaneous soft tissue edema.   Disc spaces:   T11-T12: Disc height loss with disc protrusion into the central canal. Mild central canal narrowing. No significant neural foraminal narrowing. Mild facet joint arthropathy.   T12-L1: No significant disc bulge. No neural foraminal stenosis. No central canal stenosis.   L1-L2: No significant disc bulge. No neural foraminal stenosis. No central canal stenosis.   L2-L3: No significant disc bulge. No neural foraminal stenosis. No central canal stenosis. Bilateral facet joint arthropathy, left worse than the right.   L3-L4: Mild disc protrusion with mild narrowing of lateral recesses bilaterally. Mild ligamentum flavum hypertrophy. Moderate facet joint arthropathy. No significant neural foraminal stenosis.   L4-L5: Moderate circumferential disc protrusion and ligamentum flavum hypertrophy. Mild  narrowing of lateral recesses bilaterally. Moderate facet joint arthropathy. No significant neural foraminal stenosis.   L5-S1: Disc height loss and broad-base disc protrusion. Narrowing of bilateral lateral recesses. Moderate facet joint hypertrophy. Mild bilateral neural foraminal stenosis, right worse than the left.   IMPRESSION: 1. Compression deformity of T12 vertebral body with approximately 50% vertebral body height loss, likely a chronic process.   2. Multilevel degenerate disc disease most prominent at L4-L5 and L5-S1 with mild bilateral neural foraminal stenosis at L5-S1.     Electronically Signed   By: Keane Police D.O.   On: 01/05/2022 09:00     Objective:  VS:  HT:    WT:   BMI:     BP:(!) 164/77  HR:(!) 119bpm  TEMP: ( )  RESP:  Physical Exam Vitals and nursing note reviewed.  Constitutional:      General: She is not in acute distress.    Appearance: Normal appearance. She is not ill-appearing.  HENT:     Head: Normocephalic and atraumatic.     Right Ear: External ear normal.     Left Ear: External ear normal.  Eyes:     Extraocular Movements: Extraocular movements intact.  Cardiovascular:     Rate and Rhythm: Normal rate.     Pulses: Normal pulses.  Pulmonary:  Effort: Pulmonary effort is normal. No respiratory distress.  Abdominal:     General: There is no distension.     Palpations: Abdomen is soft.  Musculoskeletal:        General: Tenderness present.     Cervical back: Neck supple.     Right lower leg: No edema.     Left lower leg: No edema.     Comments: Patient has good distal strength with no pain over the greater trochanters.  No clonus or focal weakness.  Skin:    Findings: No erythema, lesion or rash.  Neurological:     General: No focal deficit present.     Mental Status: She is alert and oriented to person, place, and time.     Sensory: No sensory deficit.     Motor: No weakness or abnormal muscle tone.     Coordination:  Coordination normal.  Psychiatric:        Mood and Affect: Mood normal.        Behavior: Behavior normal.      Imaging: No results found.

## 2022-08-18 NOTE — Procedures (Signed)
Lumbar Diagnostic Facet Joint Nerve Block with Fluoroscopic Guidance   Patient: [name]      Date of Birth: 12-24-1955 MRN: [id number] PCP: Haywood Pao, MD      Visit Date: 08/10/2022   Universal Protocol:    Date/Time: 10/17/238:27 AM  Consent Given By: the patient  Position: PRONE  Additional Comments: Vital signs were monitored before and after the procedure. Patient was prepped and draped in the usual sterile fashion. The correct patient, procedure, and site was verified.   Injection Procedure Details:   Procedure diagnoses:  1. Spondylosis without myelopathy or radiculopathy, lumbar region      Meds Administered:  Meds ordered this encounter  Medications   bupivacaine (MARCAINE) 0.5 % (with pres) injection 3 mL     Laterality: Bilateral  Location/Site: L4-L5, L3 and L4 medial branches and L5-S1, L4 medial branch and L5 dorsal ramus  Needle: 5.0 in., 25 ga.  Short bevel or Quincke spinal needle  Needle Placement: Oblique pedical  Findings:   -Comments: There was excellent flow of contrast along the articular pillars without intravascular flow.  Procedure Details: The fluoroscope beam is vertically oriented in AP and then obliqued 15 to 20 degrees to the ipsilateral side of the desired nerve to achieve the "Scotty dog" appearance.  The skin over the target area of the junction of the superior articulating process and the transverse process (sacral ala if blocking the L5 dorsal rami) was locally anesthetized with a 1 ml volume of 1% Lidocaine without Epinephrine.  The spinal needle was inserted and advanced in a trajectory view down to the target.   After contact with periosteum and negative aspirate for blood and CSF, correct placement without intravascular or epidural spread was confirmed by injecting 0.5 ml. of Isovue-250.  A spot radiograph was obtained of this image.    Next, a 0.5 ml. volume of the injectate described above was  injected. The needle was then redirected to the other facet joint nerves mentioned above if needed.  Prior to the procedure, the patient was given a Pain Diary which was completed for baseline measurements.  After the procedure, the patient rated their pain every 30 minutes and will continue rating at this frequency for a total of 5 hours.  The patient has been asked to complete the Diary and return to [place] by mail, fax or hand delivered as soon as possible.   Additional Comments:  The patient tolerated the procedure well Dressing: 2 x 2 sterile gauze and Band-Aid    Post-procedure details: Patient was observed during the procedure. Post-procedure instructions were reviewed.  Patient left the clinic in stable condition.

## 2022-10-02 DIAGNOSIS — I83813 Varicose veins of bilateral lower extremities with pain: Secondary | ICD-10-CM | POA: Diagnosis not present

## 2022-10-02 DIAGNOSIS — J309 Allergic rhinitis, unspecified: Secondary | ICD-10-CM | POA: Diagnosis not present

## 2022-10-02 DIAGNOSIS — M810 Age-related osteoporosis without current pathological fracture: Secondary | ICD-10-CM | POA: Diagnosis not present

## 2022-10-02 DIAGNOSIS — I471 Supraventricular tachycardia, unspecified: Secondary | ICD-10-CM | POA: Diagnosis not present

## 2022-10-02 DIAGNOSIS — K219 Gastro-esophageal reflux disease without esophagitis: Secondary | ICD-10-CM | POA: Diagnosis not present

## 2022-10-02 DIAGNOSIS — E7800 Pure hypercholesterolemia, unspecified: Secondary | ICD-10-CM | POA: Diagnosis not present

## 2022-10-02 DIAGNOSIS — I77810 Thoracic aortic ectasia: Secondary | ICD-10-CM | POA: Diagnosis not present

## 2022-10-02 DIAGNOSIS — I519 Heart disease, unspecified: Secondary | ICD-10-CM | POA: Diagnosis not present

## 2022-10-02 DIAGNOSIS — C50912 Malignant neoplasm of unspecified site of left female breast: Secondary | ICD-10-CM | POA: Diagnosis not present

## 2022-10-02 DIAGNOSIS — E041 Nontoxic single thyroid nodule: Secondary | ICD-10-CM | POA: Diagnosis not present

## 2022-10-02 DIAGNOSIS — E039 Hypothyroidism, unspecified: Secondary | ICD-10-CM | POA: Diagnosis not present

## 2022-10-09 DIAGNOSIS — H40053 Ocular hypertension, bilateral: Secondary | ICD-10-CM | POA: Diagnosis not present

## 2022-10-09 DIAGNOSIS — H5203 Hypermetropia, bilateral: Secondary | ICD-10-CM | POA: Diagnosis not present

## 2022-10-09 DIAGNOSIS — H524 Presbyopia: Secondary | ICD-10-CM | POA: Diagnosis not present

## 2022-10-09 DIAGNOSIS — H52223 Regular astigmatism, bilateral: Secondary | ICD-10-CM | POA: Diagnosis not present

## 2022-10-09 DIAGNOSIS — H40013 Open angle with borderline findings, low risk, bilateral: Secondary | ICD-10-CM | POA: Diagnosis not present

## 2022-10-14 ENCOUNTER — Telehealth: Payer: Self-pay | Admitting: Physical Medicine and Rehabilitation

## 2022-10-14 NOTE — Telephone Encounter (Signed)
Pt called to set an appt. Please call pt at [phone]

## 2022-10-14 NOTE — Telephone Encounter (Signed)
Spoke with patient and she stated her back is starting to hurt again and she is ready to get RFA. Scheduled for 11/04/22 and 11/11/22

## 2022-10-19 DIAGNOSIS — L400 Psoriasis vulgaris: Secondary | ICD-10-CM | POA: Diagnosis not present

## 2022-10-20 DIAGNOSIS — Z111 Encounter for screening for respiratory tuberculosis: Secondary | ICD-10-CM | POA: Diagnosis not present

## 2022-10-22 DIAGNOSIS — E7800 Pure hypercholesterolemia, unspecified: Secondary | ICD-10-CM | POA: Diagnosis not present

## 2022-11-03 ENCOUNTER — Telehealth: Payer: Self-pay | Admitting: Physical Medicine and Rehabilitation

## 2022-11-03 NOTE — Telephone Encounter (Signed)
Spoke with patient and informed her that the methotrexate was added to medication list and it should not interfere with injection

## 2022-11-03 NOTE — Telephone Encounter (Signed)
Patient started a new medication and just wanted to make Dr. Ernestina Patches , aware prior to appt tomorrow..Methotrexate '10mg'$  q weekly atarted last week..please advise..[phone]

## 2022-11-04 ENCOUNTER — Ambulatory Visit (INDEPENDENT_AMBULATORY_CARE_PROVIDER_SITE_OTHER): Payer: PPO | Admitting: Physical Medicine and Rehabilitation

## 2022-11-04 ENCOUNTER — Ambulatory Visit: Payer: Self-pay

## 2022-11-04 VITALS — BP 143/81 | HR 61

## 2022-11-04 DIAGNOSIS — M47816 Spondylosis without myelopathy or radiculopathy, lumbar region: Secondary | ICD-10-CM

## 2022-11-04 MED ORDER — METHYLPREDNISOLONE ACETATE 80 MG/ML IJ SUSP
80.0000 mg | Freq: Once | INTRAMUSCULAR | Status: AC
  Administered 2022-11-04: 80 mg

## 2022-11-04 NOTE — Progress Notes (Signed)
Functional Pain Scale - descriptive words and definitions  Distracting (5)    Aware of pain/able to complete some ADL's but limited by pain/sleep is affected and active distractions are only slightly useful. Moderate range order  Average Pain 7   +Driver, -BT, -Dye Allergies.  Lower back pain all the way across. Left side is worse than right. Has radiated down leg

## 2022-11-04 NOTE — Patient Instructions (Signed)

## 2022-11-08 NOTE — Procedures (Signed)
Lumbar Facet Joint Nerve Denervation  Patient: [name]      Date of Birth: [birth year] MRN: [id number] PCP: Haywood Pao, MD      Visit Date: 11/04/2022   Universal Protocol:    Date/Time: 01/07/242:04 PM  Consent Given By: the patient  Position: PRONE  Additional Comments: Vital signs were monitored before and after the procedure. Patient was prepped and draped in the usual sterile fashion. The correct patient, procedure, and site was verified.   Injection Procedure Details:   Procedure diagnoses:  1. Spondylosis without myelopathy or radiculopathy, lumbar region      Meds Administered:  Meds ordered this encounter  Medications   methylPREDNISolone acetate (DEPO-MEDROL) injection 80 mg     Laterality: Left  Location/Site:  L4-L5, L3 and L4 medial branches and L5-S1, L4 medial branch and L5 dorsal ramus  Needle: 18 ga.,  35m active tip, 1512mRF Cannula  Needle Placement: Along juncture of superior articular process and transverse pocess  Findings:  -Comments:  Procedure Details: For each desired target nerve, the corresponding transverse process (sacral ala for the L5 dorsal rami) was identified and the fluoroscope was positioned to square off the endplates of the corresponding vertebral body to achieve a true AP midline view.  The beam was then obliqued 15 to 20 degrees and caudally tilted 15 to 20 degrees to line up a trajectory along the target nerves. The skin over the target of the junction of superior articulating process and transverse process (sacral ala for the L5 dorsal rami) was infiltrated with 3m24mf 1% Lidocaine without Epinephrine.  The 18 gauge 27m38mtive tip outer cannula was advanced in trajectory view to the target.  This procedure was repeated for each target nerve.  Then, for all levels, the outer cannula placement was fine-tuned and the position was then confirmed with bi-planar imaging.    Test stimulation was done both  at sensory and motor levels to ensure there was no radicular stimulation. The target tissues were then infiltrated with 1 ml of 1% Lidocaine without Epinephrine. Subsequently, a percutaneous neurotomy was carried out for 90 seconds at 80 degrees Celsius.  After the completion of the lesion, 1 ml of injectate was delivered. It was then repeated for each facet joint nerve mentioned above. Appropriate radiographs were obtained to verify the probe placement during the neurotomy.   Additional Comments:  No complications occurred Dressing: 2 x 2 sterile gauze and Band-Aid    Post-procedure details: Patient was observed during the procedure. Post-procedure instructions were reviewed.  Patient left the clinic in stable condition.

## 2022-11-08 NOTE — Progress Notes (Signed)
[name] - 67 y.o. female MRN [id number]  Date of birth: [birth year]  Office Visit Note: Visit Date: 11/04/2022 PCP: Haywood Pao, MD Referred by: Haywood Pao, MD  Subjective: Chief Complaint  Patient presents with   Lower Back - Pain   HPI:  [name] is a 67 y.o. female who comes in todayfor planned radiofrequency ablation of the Left L4-5 and L5-S1 Lumbar facet joints. This would be ablation of the corresponding medial branches and/or dorsal rami.  Patient has had double diagnostic blocks with more than 50% relief.  These are documented on pain diary.  They have had chronic back pain for quite some time, more than 3 months, which has been an ongoing situation with recalcitrant axial back pain.  They have no radicular pain.  Their axial pain is worse with standing and ambulating and on exam today with facet loading.  They have had physical therapy as well as home exercise program.  The imaging noted in the chart below indicated facet pathology. Accordingly they meet all the criteria and qualification for for radiofrequency ablation and we are going to complete this today hopefully for more longer term relief as part of comprehensive management program.   ROS Otherwise per HPI.  Assessment & Plan: Visit Diagnoses:    ICD-10-CM   1. Spondylosis without myelopathy or radiculopathy, lumbar region  M47.816 XR C-ARM NO REPORT    Radiofrequency,Lumbar    methylPREDNISolone acetate (DEPO-MEDROL) injection 80 mg      Plan: No additional findings.   Meds & Orders:  Meds ordered this encounter  Medications   methylPREDNISolone acetate (DEPO-MEDROL) injection 80 mg    Orders Placed This Encounter  Procedures   Radiofrequency,Lumbar   XR C-ARM NO REPORT    Follow-up: Return for visit to requesting provider as needed.   Procedures: No procedures performed  Lumbar Facet Joint Nerve Denervation  Patient: [name]      Date of Birth: 03/23/1956 MRN: [id number] PCP: Haywood Pao, MD      Visit Date: 11/04/2022   Universal Protocol:    Date/Time: 01/07/242:04 PM  Consent Given By: the patient  Position: PRONE  Additional Comments: Vital signs were monitored before and after the procedure. Patient was prepped and draped in the usual sterile fashion. The correct patient, procedure, and site was verified.   Injection Procedure Details:   Procedure diagnoses:  1. Spondylosis without myelopathy or radiculopathy, lumbar region      Meds Administered:  Meds ordered this encounter  Medications   methylPREDNISolone acetate (DEPO-MEDROL) injection 80 mg     Laterality: Left  Location/Site:  L4-L5, L3 and L4 medial branches and L5-S1, L4 medial branch and L5 dorsal ramus  Needle: 18 ga.,  54m active tip, [id number] Cannula  Needle Placement: Along juncture of superior articular process and transverse pocess  Findings:  -Comments:  Procedure Details: For each desired target nerve, the corresponding transverse process (sacral ala for the L5 dorsal rami) was identified and the fluoroscope was positioned to square off the endplates of the corresponding vertebral body to achieve a true AP midline view.  The beam was then obliqued 15 to 20 degrees and caudally tilted 15 to 20 degrees to line up a trajectory along the target nerves. The skin over the target of the junction of superior articulating process and transverse process (sacral ala for the L5 dorsal rami) was infiltrated with 68m868mf 1% Lidocaine  without Epinephrine.  The 18 gauge 26m active tip outer cannula was advanced in trajectory view to the target.  This procedure was repeated for each target nerve.  Then, for all levels, the outer cannula placement was fine-tuned and the position was then confirmed with bi-planar imaging.    Test stimulation was done both at sensory and motor levels to ensure there was no radicular  stimulation. The target tissues were then infiltrated with 1 ml of 1% Lidocaine without Epinephrine. Subsequently, a percutaneous neurotomy was carried out for 90 seconds at 80 degrees Celsius.  After the completion of the lesion, 1 ml of injectate was delivered. It was then repeated for each facet joint nerve mentioned above. Appropriate radiographs were obtained to verify the probe placement during the neurotomy.   Additional Comments:  No complications occurred Dressing: 2 x 2 sterile gauze and Band-Aid    Post-procedure details: Patient was observed during the procedure. Post-procedure instructions were reviewed.  Patient left the clinic in stable condition.      Clinical History: MRI LUMBAR SPINE WITHOUT CONTRAST   TECHNIQUE: Multiplanar, multisequence MR imaging of the lumbar spine was performed. No intravenous contrast was administered.   COMPARISON:  Radiographs dated December 31, 2021   FINDINGS: Segmentation:  Standard.   Alignment: Mild acute kyphotic angulation at T11-T12 due to compression fracture of T12. Mild anterolisthesis of L4.   Vertebrae: Compression deformity of T12 vertebral body with approximately 50% vertebral body height loss, without evidence of edema likely a chronic process.   Conus medullaris and cauda equina: Conus extends to the L1-L2 level. Conus and cauda equina appear normal.   Paraspinal and other soft tissues: Nonspecific subcutaneous soft tissue edema.   Disc spaces:   T11-T12: Disc height loss with disc protrusion into the central canal. Mild central canal narrowing. No significant neural foraminal narrowing. Mild facet joint arthropathy.   T12-L1: No significant disc bulge. No neural foraminal stenosis. No central canal stenosis.   L1-L2: No significant disc bulge. No neural foraminal stenosis. No central canal stenosis.   L2-L3: No significant disc bulge. No neural foraminal stenosis. No central canal stenosis. Bilateral  facet joint arthropathy, left worse than the right.   L3-L4: Mild disc protrusion with mild narrowing of lateral recesses bilaterally. Mild ligamentum flavum hypertrophy. Moderate facet joint arthropathy. No significant neural foraminal stenosis.   L4-L5: Moderate circumferential disc protrusion and ligamentum flavum hypertrophy. Mild narrowing of lateral recesses bilaterally. Moderate facet joint arthropathy. No significant neural foraminal stenosis.   L5-S1: Disc height loss and broad-base disc protrusion. Narrowing of bilateral lateral recesses. Moderate facet joint hypertrophy. Mild bilateral neural foraminal stenosis, right worse than the left.   IMPRESSION: 1. Compression deformity of T12 vertebral body with approximately 50% vertebral body height loss, likely a chronic process.   2. Multilevel degenerate disc disease most prominent at L4-L5 and L5-S1 with mild bilateral neural foraminal stenosis at L5-S1.     Electronically Signed   By: IKeane PoliceD.O.   On: 01/05/2022 09:00     Objective:  VS:  HT:    WT:   BMI:     BP:(!) 143/81  HR:61bpm  TEMP: ( )  RESP:  Physical Exam Vitals and nursing note reviewed.  Constitutional:      General: She is not in acute distress.    Appearance: Normal appearance. She is obese. She is not ill-appearing.  HENT:     Head: Normocephalic and atraumatic.     Right Ear: External ear  normal.     Left Ear: External ear normal.  Eyes:     Extraocular Movements: Extraocular movements intact.  Cardiovascular:     Rate and Rhythm: Normal rate.     Pulses: Normal pulses.  Pulmonary:     Effort: Pulmonary effort is normal. No respiratory distress.  Abdominal:     General: There is no distension.     Palpations: Abdomen is soft.  Musculoskeletal:        General: Tenderness present.     Cervical back: Neck supple.     Right lower leg: No edema.     Left lower leg: No edema.     Comments: Patient has good distal strength with no  pain over the greater trochanters.  No clonus or focal weakness.  Skin:    Findings: No erythema, lesion or rash.  Neurological:     General: No focal deficit present.     Mental Status: She is alert and oriented to person, place, and time.     Sensory: No sensory deficit.     Motor: No weakness or abnormal muscle tone.     Coordination: Coordination normal.  Psychiatric:        Mood and Affect: Mood normal.        Behavior: Behavior normal.      Imaging: No results found.

## 2022-11-11 ENCOUNTER — Ambulatory Visit: Payer: Self-pay

## 2022-11-11 ENCOUNTER — Ambulatory Visit: Payer: PPO | Admitting: Physical Medicine and Rehabilitation

## 2022-11-11 VITALS — BP 162/85 | HR 65

## 2022-11-11 DIAGNOSIS — M47816 Spondylosis without myelopathy or radiculopathy, lumbar region: Secondary | ICD-10-CM | POA: Diagnosis not present

## 2022-11-11 MED ORDER — METHYLPREDNISOLONE ACETATE 80 MG/ML IJ SUSP
80.0000 mg | Freq: Once | INTRAMUSCULAR | Status: AC
  Administered 2022-11-11: 80 mg

## 2022-11-11 NOTE — Patient Instructions (Signed)

## 2022-11-11 NOTE — Progress Notes (Signed)
Functional Pain Scale - descriptive words and definitions  Moderate (4)   Constantly aware of pain, can complete ADLs with modification/sleep marginally affected at times/passive distraction is of no use, but active distraction gives some relief. Moderate range order  Average Pain 4   +Driver, -BT, -Dye Allergies.  Lower back pain the side of last RFA, but above it

## 2022-11-22 NOTE — Procedures (Signed)
Lumbar Facet Joint Nerve Denervation  Patient: [name]      Date of Birth: 1955-12-10 MRN: [id number] PCP: Haywood Pao, MD      Visit Date: 11/11/2022   Universal Protocol:    Date/Time: 01/21/245:36 PM  Consent Given By: the patient  Position: PRONE  Additional Comments: Vital signs were monitored before and after the procedure. Patient was prepped and draped in the usual sterile fashion. The correct patient, procedure, and site was verified.   Injection Procedure Details:   Procedure diagnoses:  1. Spondylosis without myelopathy or radiculopathy, lumbar region      Meds Administered:  Meds ordered this encounter  Medications   methylPREDNISolone acetate (DEPO-MEDROL) injection 80 mg     Laterality: Right  Location/Site:  L4-L5, L3 and L4 medial branches and L5-S1, L4 medial branch and L5 dorsal ramus  Needle: 18 ga.,  552m active tip, 1539mRF Cannula  Needle Placement: Along juncture of superior articular process and transverse pocess  Findings:  -Comments:  Procedure Details: For each desired target nerve, the corresponding transverse process (sacral ala for the L5 dorsal rami) was identified and the fluoroscope was positioned to square off the endplates of the corresponding vertebral body to achieve a true AP midline view.  The beam was then obliqued 15 to 20 degrees and caudally tilted 15 to 20 degrees to line up a trajectory along the target nerves. The skin over the target of the junction of superior articulating process and transverse process (sacral ala for the L5 dorsal rami) was infiltrated with 52m40mf 1% Lidocaine without Epinephrine.  The 18 gauge 28m1052mtive tip outer cannula was advanced in trajectory view to the target.  This procedure was repeated for each target nerve.  Then, for all levels, the outer cannula placement was fine-tuned and the position was then confirmed with bi-planar imaging.    Test stimulation was done both  at sensory and motor levels to ensure there was no radicular stimulation. The target tissues were then infiltrated with 1 ml of 1% Lidocaine without Epinephrine. Subsequently, a percutaneous neurotomy was carried out for 90 seconds at 80 degrees Celsius.  After the completion of the lesion, 1 ml of injectate was delivered. It was then repeated for each facet joint nerve mentioned above. Appropriate radiographs were obtained to verify the probe placement during the neurotomy.   Additional Comments:  No complications occurred Dressing: 2 x 2 sterile gauze and Band-Aid    Post-procedure details: Patient was observed during the procedure. Post-procedure instructions were reviewed.  Patient left the clinic in stable condition.

## 2022-11-22 NOTE — Progress Notes (Signed)
[name] - 67 y.o. female MRN [id number]  Date of birth: April 03, 1956  Office Visit Note: Visit Date: 11/11/2022 PCP: Haywood Pao, MD Referred by: Haywood Pao, MD  Subjective: Chief Complaint  Patient presents with   Lower Back - Pain   HPI:  [name] is a 67 y.o. female who comes in todayfor planned radiofrequency ablation of the Right L4-5 and L5-S1 Lumbar facet joints. This would be ablation of the corresponding medial branches and/or dorsal rami.  Patient has had double diagnostic blocks with more than 50% relief.  These are documented on pain diary.  They have had chronic back pain for quite some time, more than 3 months, which has been an ongoing situation with recalcitrant axial back pain.  They have no radicular pain.  Their axial pain is worse with standing and ambulating and on exam today with facet loading.  They have had physical therapy as well as home exercise program.  The imaging noted in the chart below indicated facet pathology. Accordingly they meet all the criteria and qualification for for radiofrequency ablation and we are going to complete this today hopefully for more longer term relief as part of comprehensive management program.   ROS Otherwise per HPI.  Assessment & Plan: Visit Diagnoses:    ICD-10-CM   1. Spondylosis without myelopathy or radiculopathy, lumbar region  M47.816 XR C-ARM NO REPORT    Radiofrequency,Lumbar    methylPREDNISolone acetate (DEPO-MEDROL) injection 80 mg      Plan: No additional findings.   Meds & Orders:  Meds ordered this encounter  Medications   methylPREDNISolone acetate (DEPO-MEDROL) injection 80 mg    Orders Placed This Encounter  Procedures   Radiofrequency,Lumbar   XR C-ARM NO REPORT    Follow-up: Return for visit to requesting provider as needed.   Procedures: No procedures performed  Lumbar Facet Joint Nerve Denervation  Patient: Tina Vanblarcom Der  Cameron      Date of Birth: 1956-01-31 MRN: [id number] PCP: Haywood Pao, MD      Visit Date: 11/11/2022   Universal Protocol:    Date/Time: 01/21/245:36 PM  Consent Given By: the patient  Position: PRONE  Additional Comments: Vital signs were monitored before and after the procedure. Patient was prepped and draped in the usual sterile fashion. The correct patient, procedure, and site was verified.   Injection Procedure Details:   Procedure diagnoses:  1. Spondylosis without myelopathy or radiculopathy, lumbar region      Meds Administered:  Meds ordered this encounter  Medications   methylPREDNISolone acetate (DEPO-MEDROL) injection 80 mg     Laterality: Right  Location/Site:  L4-L5, L3 and L4 medial branches and L5-S1, L4 medial branch and L5 dorsal ramus  Needle: 18 ga.,  39m active tip, [id number] Cannula  Needle Placement: Along juncture of superior articular process and transverse pocess  Findings:  -Comments:  Procedure Details: For each desired target nerve, the corresponding transverse process (sacral ala for the L5 dorsal rami) was identified and the fluoroscope was positioned to square off the endplates of the corresponding vertebral body to achieve a true AP midline view.  The beam was then obliqued 15 to 20 degrees and caudally tilted 15 to 20 degrees to line up a trajectory along the target nerves. The skin over the target of the junction of superior articulating process and transverse process (sacral ala for the L5 dorsal rami) was infiltrated with 61m53mf 1% Lidocaine  without Epinephrine.  The 18 gauge 71m active tip outer cannula was advanced in trajectory view to the target.  This procedure was repeated for each target nerve.  Then, for all levels, the outer cannula placement was fine-tuned and the position was then confirmed with bi-planar imaging.    Test stimulation was done both at sensory and motor levels to ensure there was no radicular  stimulation. The target tissues were then infiltrated with 1 ml of 1% Lidocaine without Epinephrine. Subsequently, a percutaneous neurotomy was carried out for 90 seconds at 80 degrees Celsius.  After the completion of the lesion, 1 ml of injectate was delivered. It was then repeated for each facet joint nerve mentioned above. Appropriate radiographs were obtained to verify the probe placement during the neurotomy.   Additional Comments:  No complications occurred Dressing: 2 x 2 sterile gauze and Band-Aid    Post-procedure details: Patient was observed during the procedure. Post-procedure instructions were reviewed.  Patient left the clinic in stable condition.      Clinical History: MRI LUMBAR SPINE WITHOUT CONTRAST   TECHNIQUE: Multiplanar, multisequence MR imaging of the lumbar spine was performed. No intravenous contrast was administered.   COMPARISON:  Radiographs dated December 31, 2021   FINDINGS: Segmentation:  Standard.   Alignment: Mild acute kyphotic angulation at T11-T12 due to compression fracture of T12. Mild anterolisthesis of L4.   Vertebrae: Compression deformity of T12 vertebral body with approximately 50% vertebral body height loss, without evidence of edema likely a chronic process.   Conus medullaris and cauda equina: Conus extends to the L1-L2 level. Conus and cauda equina appear normal.   Paraspinal and other soft tissues: Nonspecific subcutaneous soft tissue edema.   Disc spaces:   T11-T12: Disc height loss with disc protrusion into the central canal. Mild central canal narrowing. No significant neural foraminal narrowing. Mild facet joint arthropathy.   T12-L1: No significant disc bulge. No neural foraminal stenosis. No central canal stenosis.   L1-L2: No significant disc bulge. No neural foraminal stenosis. No central canal stenosis.   L2-L3: No significant disc bulge. No neural foraminal stenosis. No central canal stenosis. Bilateral  facet joint arthropathy, left worse than the right.   L3-L4: Mild disc protrusion with mild narrowing of lateral recesses bilaterally. Mild ligamentum flavum hypertrophy. Moderate facet joint arthropathy. No significant neural foraminal stenosis.   L4-L5: Moderate circumferential disc protrusion and ligamentum flavum hypertrophy. Mild narrowing of lateral recesses bilaterally. Moderate facet joint arthropathy. No significant neural foraminal stenosis.   L5-S1: Disc height loss and broad-base disc protrusion. Narrowing of bilateral lateral recesses. Moderate facet joint hypertrophy. Mild bilateral neural foraminal stenosis, right worse than the left.   IMPRESSION: 1. Compression deformity of T12 vertebral body with approximately 50% vertebral body height loss, likely a chronic process.   2. Multilevel degenerate disc disease most prominent at L4-L5 and L5-S1 with mild bilateral neural foraminal stenosis at L5-S1.     Electronically Signed   By: IKeane PoliceD.O.   On: 01/05/2022 09:00     Objective:  VS:  HT:    WT:   BMI:     BP:(!) 162/85  HR:65bpm  TEMP: ( )  RESP:  Physical Exam Vitals and nursing note reviewed.  Constitutional:      General: She is not in acute distress.    Appearance: Normal appearance. She is obese. She is not ill-appearing.  HENT:     Head: Normocephalic and atraumatic.     Right Ear: External ear  normal.     Left Ear: External ear normal.  Eyes:     Extraocular Movements: Extraocular movements intact.  Cardiovascular:     Rate and Rhythm: Normal rate.     Pulses: Normal pulses.  Pulmonary:     Effort: Pulmonary effort is normal. No respiratory distress.  Abdominal:     General: There is no distension.     Palpations: Abdomen is soft.  Musculoskeletal:        General: Tenderness present.     Cervical back: Neck supple.     Right lower leg: No edema.     Left lower leg: No edema.     Comments: Patient has good distal strength with no  pain over the greater trochanters.  No clonus or focal weakness.  Skin:    Findings: No erythema, lesion or rash.  Neurological:     General: No focal deficit present.     Mental Status: She is alert and oriented to person, place, and time.     Sensory: No sensory deficit.     Motor: No weakness or abnormal muscle tone.     Coordination: Coordination normal.  Psychiatric:        Mood and Affect: Mood normal.        Behavior: Behavior normal.      Imaging: No results found.

## 2022-11-26 DIAGNOSIS — E039 Hypothyroidism, unspecified: Secondary | ICD-10-CM | POA: Diagnosis not present

## 2022-11-26 DIAGNOSIS — E7800 Pure hypercholesterolemia, unspecified: Secondary | ICD-10-CM | POA: Diagnosis not present

## 2022-11-26 DIAGNOSIS — D649 Anemia, unspecified: Secondary | ICD-10-CM | POA: Diagnosis not present

## 2022-11-30 DIAGNOSIS — L400 Psoriasis vulgaris: Secondary | ICD-10-CM | POA: Diagnosis not present

## 2022-12-01 DIAGNOSIS — M79652 Pain in left thigh: Secondary | ICD-10-CM | POA: Diagnosis not present

## 2022-12-01 DIAGNOSIS — M79651 Pain in right thigh: Secondary | ICD-10-CM | POA: Diagnosis not present

## 2022-12-01 DIAGNOSIS — M791 Myalgia, unspecified site: Secondary | ICD-10-CM | POA: Diagnosis not present

## 2022-12-03 ENCOUNTER — Telehealth: Payer: Self-pay | Admitting: Cardiology

## 2022-12-03 NOTE — Telephone Encounter (Signed)
Called and spoke to patient about thigh pain that she reports she has been experiencing for 4 weeks. She states she has already seen her PCP about this and they ran some lab work. She states "they eliminated the possibility of it coming from the  bone or the muscle." She states she can bring that outside lab work with her to her visit with Terie Purser.  She states she only experiences this pain at night and it improves with getting up and walking around. She states she stopped her statin for 3 days and it improved. She knows she needs to continue this medication but she wants to talk to someone about whether her statin could be causing this pain that is 6-7/10.   She states she also wondered if it was her psoriasis which is on the back and side of her thighs. She states she just started methotrexate for this. Patient has been scheduled for visit with Laurann Montana at [facility] office on 12/07/22.

## 2022-12-03 NOTE — Telephone Encounter (Signed)
New Message:    Patient says she have been having pain in her thighs for over 4 weeks. She says the pain is so bad, it wakes her up; at night. She wanted to be seen. I made her an appointment for Monday(12-07-22) with Laurann Montana.

## 2022-12-07 ENCOUNTER — Ambulatory Visit (HOSPITAL_BASED_OUTPATIENT_CLINIC_OR_DEPARTMENT_OTHER): Payer: PPO | Admitting: Family

## 2022-12-07 ENCOUNTER — Encounter (HOSPITAL_BASED_OUTPATIENT_CLINIC_OR_DEPARTMENT_OTHER): Payer: Self-pay | Admitting: Family

## 2022-12-07 VITALS — BP 134/78 | HR 63 | Ht 66.0 in | Wt 239.0 lb

## 2022-12-07 DIAGNOSIS — I471 Supraventricular tachycardia, unspecified: Secondary | ICD-10-CM

## 2022-12-07 DIAGNOSIS — M79651 Pain in right thigh: Secondary | ICD-10-CM | POA: Diagnosis not present

## 2022-12-07 DIAGNOSIS — I5032 Chronic diastolic (congestive) heart failure: Secondary | ICD-10-CM | POA: Diagnosis not present

## 2022-12-07 DIAGNOSIS — Z6838 Body mass index (BMI) 38.0-38.9, adult: Secondary | ICD-10-CM

## 2022-12-07 DIAGNOSIS — M79652 Pain in left thigh: Secondary | ICD-10-CM | POA: Diagnosis not present

## 2022-12-07 DIAGNOSIS — I10 Essential (primary) hypertension: Secondary | ICD-10-CM

## 2022-12-07 DIAGNOSIS — R0683 Snoring: Secondary | ICD-10-CM | POA: Diagnosis not present

## 2022-12-07 NOTE — Progress Notes (Signed)
Office Visit    Patient Name: [name] Date of Encounter: 12/07/2022  PCP:  Haywood Pao, MD   [place]  Cardiologist:  Fransico Him, MD  Advanced Practice Provider:  No care team member to display Electrophysiologist:  None   Chief Complaint    [name] is a 67 y.o. female presents today for thigh pain.    Past Medical History    Past Medical History:  Diagnosis Date   Anxiety    Arthritis    lower back and thumbs   Breast cancer of upper-outer quadrant of left female breast ([place]) 09/19/2013   Cancer ([place])    left breast-(12-12-09 radiation and surgery)-Dr. Jana Hakim -oncology   Chronic diastolic CHF (congestive heart failure) (HCC)    Colon polyps    sm aden p 10/06 (difficult exam, needs propofol);neg 07/2010   Dyslipidemia    Edema extremities 02/07/2016   GERD (gastroesophageal reflux disease)    Barrett's [facility]), neg for metaplasia '97, '08 (mod HH) 9.2011   HTN (hypertension)    Hypothyroidism, postsurgical 04/03/2013   Laryngitis 12-20-12   recent episode is resolving with Amoxicillin orally   Mild dilation of ascending aorta (HCC)    Obesity    Osteoporosis    Paroxysmal supraventricular tachycardia 12/22/2013   Personal history of radiation therapy    2011   PONV (postoperative nausea and vomiting)    Thyroid adenoma 12-20-12   surgery planned 12-30-12   Vaginal atrophy 08/18/2013   Vaginitis and vulvovaginitis 08/18/2013   Past Surgical History:  Procedure Laterality Date   BREAST LUMPECTOMY Left    2011   BREAST SURGERY     left breast lumpectomy   EYE SURGERY  12-20-12   right eye retina tear repair   THYROIDECTOMY N/A 12/30/2012   Procedure: THYROIDECTOMY;  Surgeon: Earnstine Regal, MD;  Location: [facility] ORS;  Service: General;  Laterality: N/A;    Allergies  Allergies  Allergen Reactions   Sulfa Antibiotics Hives   Sulfa Drugs Cross Reactors Hives   Venlafaxine     History of  Present Illness    [name] is a 67 y.o. female with a hx of PSVT, mildly dilated ascending aorta, thyroid melanoma, remote breast cancer, former tobacco use, dyslipidemia, GERD, mild hypertension, obesity, arthritis, Barrett's esophagus last seen 12/09/21.  Echo 09/16/17 with normal LVEF 84-69%, grade 2 diastolic dysfunction, mildly dilated ascending aorta 36m. Monitor 05/20/21 ordered due to episode of SVT as well as bradycardia with predominantly NSR with average heart rate 61 bpm. She last saw Dr. TRadford Pax2/7/23 noting increasing LE edema. LE duplex with no DVT. ABI 12/18/21 were normal.   She contacted the office 12/03/22 noting bilateral thigh pain x 4 weeks waking her up at night that improved with moving.    She presents today for complaints of thigh pain in both legs that is waking her up at night. Started three weeks ago and describes as excruciating. She gets up and walks around and it alleviates the pain completely. She made a appointment with her PCP with  sed rate, CRP, ANA normal. She self discontinued her statins Jan 22nd - Feb 2nd. She resumed the statin and has not had her leg cramps since resuming statin. Her legs would be sore the next morning. She is not sure if it was leg cramp. No new difficulties walking. Feels she is slow to get going in the morning  but this improves by about 10 am. She does snore. Often takes a nap in the midday but this has been her routine for many years. She had a sleep study many years ago.   Has been having intermittent dizziness described as for months. Does have Meclizine on hand as needed. Longstanding swelling in lower leg extremity for years. She does wear compression stocking and elevates her legs. Notes right leg swelling is minimally increased from previous but overall not bothersome.  She watches her salt intake very well. She goes to the pool several times a week for exercise. Denies chest pain, shortness of breath.    EKGs/Labs/Other Studies Reviewed:   The following studies were reviewed today:  EKG:  EKG is ordered today.  The ekg ordered today demonstrates NSR 63 bpm with no acute ST/T wave changes.   Recent Labs: 12/09/2021: BUN 11; Creatinine, Ser 0.74; Potassium 4.1; Sodium 143  Recent Lipid Panel No results found for: "CHOL", "TRIG", "HDL", "CHOLHDL", "VLDL", "LDLCALC", "LDLDIRECT"  Home Medications   Current Meds  Medication Sig   alendronate (FOSAMAX) 70 MG tablet Take 70 mg by mouth once a week.   ALPRAZolam (XANAX) 1 MG tablet Take 1 mg by mouth at bedtime as needed.   aspirin EC 81 MG tablet Take 81 mg by mouth every other day. Sundays and Thursdays   Calcium Carbonate (CALCIUM 600 PO) Take by mouth daily.   diltiazem (CARDIZEM CD) 120 MG 24 hr capsule TAKE 1 CAPSULE BY MOUTH ONCE DAILY IN THE MORNING .   folic acid (FOLVITE) 1 MG tablet Take 1 mg by mouth daily.   furosemide (LASIX) 20 MG tablet Take 1 tablet (20 mg total) by mouth daily.   hydrOXYzine (VISTARIL) 25 MG capsule Take 1 capsule by mouth daily as needed. itching   methotrexate (RHEUMATREX) 2.5 MG tablet Take 10 mg by mouth once a week.   Multiple Vitamin (MULTIVITAMIN) tablet Take 1 tablet by mouth daily.   Omega 3 1000 MG CAPS Take 2,000 mg by mouth daily.   omeprazole (PRILOSEC) 40 MG capsule Take 40 mg by mouth daily.   pantoprazole (PROTONIX) 40 MG tablet Take 1 tablet (40 mg total) daily by mouth.   pravastatin (PRAVACHOL) 40 MG tablet Take 40 mg by mouth daily.   sertraline (ZOLOFT) 100 MG tablet Take 150 mg by mouth daily.   sodium chloride (OCEAN) 0.65 % SOLN nasal spray Place 1 spray into the nose at bedtime.   SYNTHROID 112 MCG tablet Take 224 mcg by mouth every morning.   tiZANidine (ZANAFLEX) 2 MG tablet TAKE 1 TABLET BY MOUTH EVERY 12 HOURS AS NEEDED FOR MUSCLE SPASM   Vitamin D-Vitamin K (VITAMIN K2-VITAMIN D3 PO) Take by mouth.     Review of Systems      All other systems reviewed and are otherwise  negative except as noted above.  Physical Exam    VS:  BP 134/78   Pulse 63   Ht '5\' 6"'$  (1.676 m)   Wt 239 lb (108.4 kg)   BMI 38.58 kg/m  , BMI Body mass index is 38.58 kg/m.  Wt Readings from Last 3 Encounters:  12/07/22 239 lb (108.4 kg)  12/09/21 247 lb (112 kg)  06/20/21 246 lb (111.6 kg)    GEN: Well nourished, well developed, in no acute distress. HEENT: normal. Neck: Supple, no JVD, carotid bruits, or masses. Cardiac: RRR, no murmurs, rubs, or gallops. No clubbing, cyanosis. Bilateral LE with trace nonpitting edema. .  Radials/PT  2+ and equal bilaterally.  Respiratory:  Respirations regular and unlabored, clear to auscultation bilaterally. GI: Soft, nontender, nondistended. MS: No deformity or atrophy. Skin: Warm and dry, no rash. Neuro:  Strength and sensation are intact. Psych: Normal affect.  Assessment & Plan    Thigh pain - 4 week history of bilateral thigh pain at night improved by walking. Atypical for PAD with ABI 12/2021 normal, no indication for repeat ABI. She notes her pain did improve when off her statin for 2 weeks but she has since resumed with no difficulty. Discussed that statin more likely to cause generalized myalgias and she had been on for 6 years without previous difficulty. She is agreeable to continue Pravastatin. She will let us know if leg pain recurs and could trial alternate statin if she wished. Does have known lumbar spondylosis followed by Dr. Ernestina Patches.   Snores / Sleep disordered breathing - STOP Bang 5. [place] home sleep study provided in clinic.   PSVT -Monitor 06/2021 no significant arrhythmias. Overall quiescent on Diltiazem 120 mg.   Hypothyroidism-Previous thyroidectomy.  Continue to follow with PCP.   Chronic diastolic heart failure - Euvolemic and well compensated on exam.  Continue current dose furosemide 20 mg daily.   Obesity / BMI 38 - Discussed that Medicare does not cover weight loss medication. Discussed possible referral to  [facility] or [facility] which she will consider. Referred to health coaching today for goal setting. She recently resumed exercising in the pool.   HTN - BP overall well controlled. Continue current antihypertensive regimen.    Disposition: Follow up  as scheduled with Dr. Radford Pax  per her preference  Signed, Loel Dubonnet, NP 12/07/2022, 11:04 AM [place]

## 2022-12-07 NOTE — Patient Instructions (Addendum)
Medication Instructions:  Your Physician recommend you continue on your current medication as directed.    *If you need a refill on your cardiac medications before your next appointment, please call your pharmacy*  Testing/Procedures: WatchPAT?  Is a FDA cleared portable home sleep study test that uses a watch and 3 points of contact to monitor 7 different channels, including your heart rate, oxygen saturations, body position, snoring, and chest motion.  The study is easy to use from the comfort of your own home and accurately detect sleep apnea.  Before bed, you attach the chest sensor, attached the sleep apnea bracelet to your nondominant hand, and attach the finger probe.  After the study, the raw data is downloaded from the watch and scored for apnea events.   For more information: [email or web address]  Patient Testing Instructions:  Do not put battery into the device until bedtime when you are ready to begin the test. Please call the support number if you need assistance after following the instructions below: 24 hour support line- [phone] or ITAMAR support at [phone] (option 2)  Download the [employer] AmericanWatchPAT One" app through the google play store or App Store  Be sure to turn on or enable access to bluetooth in settlings on your smartphone/ device  Make sure no other bluetooth devices are on and within the vicinity of your smartphone/ device and WatchPAT watch during testing.  Make sure to leave your smart phone/ device plugged in and charging all night.  When ready for bed:  Follow the instructions step by step in the WatchPAT One App to activate the testing device. For additional instructions, including video instruction, visit the WatchPAT One video on Youtube. You can search for [place] One within Youtube (video is 4 minutes and 18 seconds) or enter: https://youtube/watch?v=BCce_vbiwxE Please note: You will be prompted to enter a Pin to connect via  bluetooth when starting the test. The PIN will be assigned to you when you receive the test.  The device is disposable, but it recommended that you retain the device until you receive a call letting you know the study has been received and the results have been interpreted.  We will let you know if the study did not transmit to [place] properly after the test is completed. You do not need to call us to confirm the receipt of the test.  Please complete the test within 48 hours of receiving PIN.   Frequently Asked Questions:  What is Watch [place] one?  A single use fully disposable home sleep apnea testing device and will not need to be returned after completion.  What are the requirements to use WatchPAT one?  The be able to have a successful watchpat one sleep study, you should have your Watch pat one device, your smart phone, watch pat one app, your PIN number and Internet access What type of phone do I need?  You should have a smart phone that uses Android 5.1 and above or any Iphone with IOS 10 and above How can I download the WatchPAT one app?  Based on your device type search for WatchPAT one app either in google play for android devices or APP store for Iphone's Where will I get my PIN for the study?  Your PIN will be provided by your physician's office. It is used for authentication and if you lose/forget your PIN, please reach out to your providers office.  I do not have Internet at home. Can I do WatchPAT one study?  WatchPAT  One needs Internet connection throughout the night to be able to transmit the sleep data. You can use your home/local internet or your cellular's data package. However, it is always recommended to use home/local Internet. It is estimated that between 20MB-30MB will be used with each study.However, the application will be looking for 80MB space in the phone to start the study.  What happens if I lose internet or bluetooth connection?  During the internet disconnection, your  phone will not be able to transmit the sleep data. All the data, will be stored in your phone. As soon as the internet connection is back on, the phone will being sending the sleep data. During the bluetooth disconnection, WatchPAT one will not be able to to send the sleep data to your phone. Data will be kept in the [facility] one until two devices have bluetooth connection back on. As soon as the connection is back on, WatchPAT one will send the sleep data to the phone.  How long do I need to wear the WatchPAT one?  After you start the study, you should wear the device at least 6 hours.  How far should I keep my phone from the device?  During the night, your phone should be within 15 feet.  What happens if I leave the room for restroom or other reasons?  Leaving the room for any reason will not cause any problem. As soon as your get back to the room, both devices will reconnect and will continue to send the sleep data. Can I use my phone during the sleep study?  Yes, you can use your phone as usual during the study. But it is recommended to put your watchpat one on when you are ready to go to bed.  How will I get my study results?  A soon as you completed your study, your sleep data will be sent to the provider. They will then share the results with you when they are ready.   Follow-Up: At [facility], you and your health needs are our priority.  As part of our continuing mission to provide you with exceptional heart care, we have created designated Provider Care Teams.  These Care Teams include your primary Cardiologist (physician) and Advanced Practice Providers (APPs -  Physician Assistants and Nurse Practitioners) who all work together to provide you with the care you need, when you need it.  We recommend signing up for the patient portal called "MyChart".  Sign up information is provided on this After Visit Summary.  MyChart is used to connect with patients for Virtual Visits  (Telemedicine).  Patients are able to view lab/test results, encounter notes, upcoming appointments, etc.  Non-urgent messages can be sent to your provider as well.   To learn more about what you can do with MyChart, go to [email or web address].    Your next appointment:   Follow up as scheduled with Dr. Radford Pax   Other Instructions Let us know if you would like a referral to the PREP (physical referred exercise program) at the [facility]. This is a twice per week exercise and dietary education class led by nurses for a total of 3 months.  There is also the [facility] if you are ever interested in referral.

## 2022-12-08 ENCOUNTER — Encounter (HOSPITAL_BASED_OUTPATIENT_CLINIC_OR_DEPARTMENT_OTHER): Payer: Self-pay

## 2022-12-09 ENCOUNTER — Telehealth: Payer: Self-pay

## 2022-12-09 DIAGNOSIS — Z0000 Encounter for general adult medical examination without abnormal findings: Secondary | ICD-10-CM

## 2022-12-09 NOTE — Telephone Encounter (Signed)
Called patient per referral from Laurann Montana for health coaching. Left patient a message to return call to discuss health coaching for weight loss.  Avelino Leeds, MS, ERHD, [facility]  Care Guide, Health & Wellness Coach [address] [place] [address] Telephone: [phone] Email: [email or web address]

## 2022-12-10 ENCOUNTER — Telehealth: Payer: Self-pay | Admitting: *Deleted

## 2022-12-10 NOTE — Telephone Encounter (Signed)
Secure chat sent to Elonda Husky ok to activate itamar. No PA is required.

## 2022-12-14 ENCOUNTER — Encounter (INDEPENDENT_AMBULATORY_CARE_PROVIDER_SITE_OTHER): Payer: PPO | Admitting: Cardiology

## 2022-12-14 DIAGNOSIS — G4733 Obstructive sleep apnea (adult) (pediatric): Secondary | ICD-10-CM | POA: Diagnosis not present

## 2022-12-14 NOTE — Telephone Encounter (Signed)
Recommend trial 2 weeks off statin. If after 2 weeks she will let us know if her leg pain is better we can consider alternate statin or possibly Zetia at that time.   Okay to go ahead and complete sleep study   Loel Dubonnet, NP

## 2022-12-14 NOTE — Telephone Encounter (Signed)
Please advise for new cholesterol management

## 2022-12-18 ENCOUNTER — Ambulatory Visit: Payer: PPO | Attending: Cardiology | Admitting: Cardiology

## 2022-12-18 ENCOUNTER — Encounter: Payer: Self-pay | Admitting: Cardiology

## 2022-12-18 VITALS — BP 122/72 | HR 72 | Ht 66.0 in | Wt 238.8 lb

## 2022-12-18 DIAGNOSIS — I10 Essential (primary) hypertension: Secondary | ICD-10-CM

## 2022-12-18 DIAGNOSIS — I77810 Thoracic aortic ectasia: Secondary | ICD-10-CM

## 2022-12-18 DIAGNOSIS — I5032 Chronic diastolic (congestive) heart failure: Secondary | ICD-10-CM | POA: Diagnosis not present

## 2022-12-18 DIAGNOSIS — I509 Heart failure, unspecified: Secondary | ICD-10-CM | POA: Diagnosis not present

## 2022-12-18 DIAGNOSIS — I471 Supraventricular tachycardia, unspecified: Secondary | ICD-10-CM | POA: Diagnosis not present

## 2022-12-18 NOTE — Progress Notes (Signed)
Date:  12/18/2022   ID:  [name], DOB 1956-10-30, MRN [id number] The patient was identified using 2 identifiers.  PCP:  Tisovec, Fransico Him, MD  Cardiologist:  Fransico Him, MD  Electrophysiologist:  None   Evaluation Performed:  Follow-Up Visit  Chief Complaint:  SVT, HTN, dilated aorta, CHF  History of Present Illness:    [name] is a 67 y.o. female with a hx of PSVT, mildly dilated ascending aorta, thyroid adenoma, remote breast CA, former tobacco abuse, dyslipidemia (followed by PCP), GERD, recent mild HTN, obesity.  Her last echo on 09/16/2017 showed normal LV function with EF 60 to 65% with grade 2 diastolic dysfunction, mildly dilated ascending aorta at 38 mm.  Since I saw her last she had back ablations for chronic back pain.  After that she had a severe flare of her psoriasis in her LEs with severe pain in her thighs and legs.  She is now on methotrexate.  She was concerned that her statin might be contributing to her leg pain and she stopped her statin.  Her sx improved and so she restarted the statin and her sx came back.  She is now off the statin again and immediately her pain resolved. She saw Tina Montana, NP and complained of snoring and non restorative sleep and did a HST on Monday with results pending.   She is here today for followup and is doing well. She denies any chest pain or pressure, SOB, DOE, PND, orthopnea, LE edema or syncope. She has had some dizziness with the MTX.  She has had some flip flops in her heart beat at times but thinks it is related to stress and she has not had any sustained palpitations.  He is compliant with her meds and is tolerating meds with no SE.      Past Medical History:  Diagnosis Date   Anxiety    Arthritis    lower back and thumbs   Breast cancer of upper-outer quadrant of left female breast ([place]) 09/19/2013   Cancer ([place])    left breast-(12-12-09 radiation and surgery)-Dr. Jana Hakim -oncology    Chronic diastolic CHF (congestive heart failure) (HCC)    Colon polyps    sm aden p 10/06 (difficult exam, needs propofol);neg 07/2010   Dyslipidemia    Edema extremities 02/07/2016   GERD (gastroesophageal reflux disease)    [facility]), neg for metaplasia '97, '08 (mod HH) 9.2011   HTN (hypertension)    Hypothyroidism, postsurgical 04/03/2013   Laryngitis 12-20-12   recent episode is resolving with Amoxicillin orally   Mild dilation of ascending aorta (HCC)    Obesity    Osteoporosis    Paroxysmal supraventricular tachycardia 12/22/2013   Personal history of radiation therapy    2011   PONV (postoperative nausea and vomiting)    Thyroid adenoma 12-20-12   surgery planned 12-30-12   Vaginal atrophy 08/18/2013   Vaginitis and vulvovaginitis 08/18/2013   Past Surgical History:  Procedure Laterality Date   BREAST LUMPECTOMY Left    2011   BREAST SURGERY     left breast lumpectomy   EYE SURGERY  12-20-12   right eye retina tear repair   THYROIDECTOMY N/A 12/30/2012   Procedure: THYROIDECTOMY;  Surgeon: Earnstine Regal, MD;  Location: [facility] ORS;  Service: General;  Laterality: N/A;     Current Meds  Medication Sig   Multiple Vitamin (MULTIVITAMIN ADULT PO) Take 1 Dose by mouth daily. 2  gummies daily     Allergies:   Sulfa antibiotics, Sulfa drugs cross reactors, and Venlafaxine   Social History   Tobacco Use   Smoking status: Former    Types: Cigarettes    Quit date: 01/31/2010    Years since quitting: 12.8   Smokeless tobacco: Never  Vaping Use   Vaping Use: Never used  Substance Use Topics   Alcohol use: Yes    Comment: social- occ   Drug use: No     Family Hx: The patient's family history includes Cancer in her brother; Heart disease in her father; Stroke in her mother.  ROS:   Please see the history of present illness.     All other systems reviewed and are negative.   Prior CV studies:   The following studies were reviewed today:  none  Labs/Other  Tests and Data Reviewed:    EKG:  No ECG reviewed.  Recent Labs: No results found for requested labs within last 365 days.   Recent Lipid Panel No results found for: "CHOL", "TRIG", "HDL", "CHOLHDL", "LDLCALC", "LDLDIRECT"  Wt Readings from Last 3 Encounters:  12/18/22 238 lb 12.8 oz (108.3 kg)  12/07/22 239 lb (108.4 kg)  12/09/21 247 lb (112 kg)     Risk Assessment/Calculations:      Objective:    Vital Signs:  BP 122/72   Pulse 72   Ht 5' 6"$  (1.676 m)   Wt 238 lb 12.8 oz (108.3 kg)   SpO2 96%   BMI 38.54 kg/m   GEN: Well nourished, well developed in no acute distress HEENT: Normal NECK: No JVD; No carotid bruits LYMPHATICS: No lymphadenopathy CARDIAC:RRR, no murmurs, rubs, gallops RESPIRATORY:  Clear to auscultation without rales, wheezing or rhonchi  ABDOMEN: Soft, non-tender, non-distended MUSCULOSKELETAL:  No edema; No deformity  SKIN: Warm and dry NEUROLOGIC:  Alert and oriented x 3 PSYCHIATRIC:  Normal affect  ASSESSMENT & PLAN:    1.  PSVT -she has not had any further sustained palpitations but occasionally has a flip flop in her heart beat -continue prescription drug management with Cardizem CD 140m daily with PRN refills   2.  Dilated ascending aorta -2D echo with normal aortic root and ascending aortic diameter   3.  Chronic diastolic CHF -she has chronic SOB and LE edema which are very stable -weight is stable and actually down from 10lbs a year ago -she will continue to use compression hose during the day -continue prescription drug management lasix 270mdaily with PRN refills -check BMET  4.  HTN -BP controlled on exam today -continue prescription drug management with cardizem CD 12066maily with PRN refills  5.  HLD -LDL goal < 100 -she has been intolerant to Pravastatin due to leg pain -refer to lipid clinic for evaluation -I will get a coronary Ca score to assess cardiac risk>>If she has coronary Ca then would qualify for  PCSK9i   Medication Adjustments/Labs and Tests Ordered: Current medicines are reviewed at length with the patient today.  Concerns regarding medicines are outlined above.   Tests Ordered: No orders of the defined types were placed in this encounter.   Medication Changes: No orders of the defined types were placed in this encounter.   Follow Up:  In Person in 1 year(s)  Signed, TraFransico HimD  12/18/2022 9:53 AM    [place]

## 2022-12-18 NOTE — Addendum Note (Signed)
Addended by: Eather Colas L on: 12/18/2022 10:14 AM   Modules accepted: Orders

## 2022-12-18 NOTE — Addendum Note (Signed)
Addended by: Joni Reining on: 12/18/2022 10:17 AM   Modules accepted: Orders

## 2022-12-18 NOTE — Patient Instructions (Addendum)
Medication Instructions:  Your physician recommends that you continue on your current medications as directed. Please refer to the Current Medication list given to you today.  *If you need a refill on your cardiac medications before your next appointment, please call your pharmacy*   Lab Work: Please complete a BMET in our lab before you leave today.  If you have labs (blood work) drawn today and your tests are completely normal, you will receive your results only by: [place] (if you have MyChart) OR A paper copy in the mail If you have any lab test that is abnormal or we need to change your treatment, we will call you to review the results.   Testing/Procedures: Dr. Radford Pax has ordered a coronary calcium score CT for you. Patient responsibility will likely be $94-99. These test gives [place] information on your risk of coronary blockages among other things.    Follow-Up:    Your next appointment:   1 year(s)  Provider:   Fransico Him, MD

## 2022-12-19 ENCOUNTER — Ambulatory Visit: Payer: PPO | Attending: Family

## 2022-12-19 DIAGNOSIS — I471 Supraventricular tachycardia, unspecified: Secondary | ICD-10-CM

## 2022-12-19 DIAGNOSIS — Z6838 Body mass index (BMI) 38.0-38.9, adult: Secondary | ICD-10-CM

## 2022-12-19 DIAGNOSIS — R0683 Snoring: Secondary | ICD-10-CM

## 2022-12-19 DIAGNOSIS — I5032 Chronic diastolic (congestive) heart failure: Secondary | ICD-10-CM

## 2022-12-19 DIAGNOSIS — I10 Essential (primary) hypertension: Secondary | ICD-10-CM

## 2022-12-19 LAB — BASIC METABOLIC PANEL
BUN/Creatinine Ratio: 15 (ref 12–28)
BUN: 13 mg/dL (ref 8–27)
CO2: 26 mmol/L (ref 20–29)
Calcium: 9.2 mg/dL (ref 8.7–10.3)
Chloride: 103 mmol/L (ref 96–106)
Creatinine, Ser: 0.84 mg/dL (ref 0.57–1.00)
Glucose: 90 mg/dL (ref 70–99)
Potassium: 4.2 mmol/L (ref 3.5–5.2)
Sodium: 141 mmol/L (ref 134–144)
eGFR: 76 mL/min/{1.73_m2} (ref >59)

## 2022-12-19 NOTE — Procedures (Signed)
SLEEP STUDY REPORT Patient Information Study Date: 12/14/2022 Patient Name: [name] Patient ID: [id number] Birth Date: [birth year] Age: 67 Gender: Female BMI: 38.6 (W=240 lb, H=5' 6'') Referring Physician: Laurann Montana, NP  TEST DESCRIPTION:  Home sleep apnea testing was completed using the WatchPat, a Type 1 device, utilizing peripheral arterial tonometry (PAT), chest movement, actigraphy, pulse oximetry, pulse rate, body position and snore.  AHI was calculated with apnea and hypopnea using valid sleep time as the denominator. RDI includes apneas, hypopneas, and RERAs.  The data acquired and the scoring of sleep and all associated events were performed in accordance with the recommended standards and specifications as outlined in the AASM Manual for the Scoring of Sleep and Associated Events 2.2.0 (2015).  FINDINGS:  1.  Moderate Obstructive Sleep Apnea with AHI 23/hr.   2.  No Central Sleep Apnea with pAHIc 1.6/hr.  3.  Oxygen desaturations as low as 79%.  4.  Severe snoring was present. O2 sats were < 88% for 91.3 min.  5.  Total sleep time was 7 hrs and 5 min.  6.  22.7% of total sleep time was spent in REM sleep.   7.  Normal sleep onset latency at 16 min  8.  Prolonged REM sleep onset latency at 109 min.   9.  Total awakenings were 9.  10. Arrhythmia detection:  None.  DIAGNOSIS:   Moderate Obstructive Sleep Apnea (G47.33) Nocturnal Hypoxemia  RECOMMENDATIONS:   1.  Clinical correlation of these findings is necessary.  The decision to treat obstructive sleep apnea (OSA) is usually based on the presence of apnea symptoms or the presence of associated medical conditions such as Hypertension, Congestive Heart Failure, Atrial Fibrillation or Obesity.  The most common symptoms of OSA are snoring, gasping for breath while sleeping, daytime sleepiness and fatigue.   2.  Initiating apnea therapy is recommended given the presence of symptoms and/or associated  conditions. Recommend proceeding with one of the following:     a.  Auto-CPAP therapy with a pressure range of 5-20cm H2O.     b.  An oral appliance (OA) that can be obtained from certain dentists with expertise in sleep medicine.  These are primarily of use in non-obese patients with mild and moderate disease.     c.  An ENT consultation which may be useful to look for specific causes of obstruction and possible treatment options.     d.  If patient is intolerant to PAP therapy, consider referral to ENT for evaluation for hypoglossal nerve stimulator.   3.  Close follow-up is necessary to ensure success with CPAP or oral appliance therapy for maximum benefit.  4.  A follow-up oximetry study on CPAP is recommended to assess the adequacy of therapy and determine the need for supplemental oxygen or the potential need for Bi-level therapy.  An arterial blood gas to determine the adequacy of baseline ventilation and oxygenation should also be considered.  5.  Healthy sleep recommendations include:  adequate nightly sleep (normal 7-9 hrs/night), avoidance of caffeine after noon and alcohol near bedtime, and maintaining a sleep environment that is cool, dark and quiet.  6.  Weight loss for overweight patients is recommended.  Even modest amounts of weight loss can significantly improve the severity of sleep apnea.  7.  Snoring recommendations include:  weight loss where appropriate, side sleeping, and avoidance of alcohol before bed.  8.  Operation of motor vehicle should not be performed when sleepy.  Signature: Mikiyah Glasner  Radford Pax, MD; [facility]; [place], [place] Board of Sleep Medicine Electronically Signed: 12/19/2022

## 2022-12-22 ENCOUNTER — Telehealth: Payer: Self-pay

## 2022-12-22 NOTE — Telephone Encounter (Signed)
Left detailed message that her BMET was normal per DPR on file. Advised patient to continue medical regimen.

## 2022-12-22 NOTE — Telephone Encounter (Signed)
-----   Message from Sueanne Margarita, MD sent at 12/19/2022  9:01 AM EST ----- Please let patient know that labs were normal.  Continue current medical therapy.

## 2022-12-28 ENCOUNTER — Telehealth: Payer: Self-pay | Admitting: Orthopedic Surgery

## 2022-12-28 ENCOUNTER — Other Ambulatory Visit: Payer: Self-pay | Admitting: Surgical

## 2022-12-28 DIAGNOSIS — M79604 Pain in right leg: Secondary | ICD-10-CM

## 2022-12-28 MED ORDER — TIZANIDINE HCL 2 MG PO TABS: 2.0000 mg | ORAL_TABLET | Freq: Three times a day (TID) | ORAL | 0 refills | Status: DC | PRN

## 2022-12-28 NOTE — Telephone Encounter (Signed)
Patient sent MyChart message request on Rx refill  I'm having sciatic issues L hip in addition to thigh pain. I have 2.5 '2mg'$  of Tizanidine left.  I take .5 during the day but need '1mg'$  at night. Can you ask if he'd call in at least 15 for me to get through to the 13th. Please advise

## 2022-12-28 NOTE — Telephone Encounter (Signed)
Sent in

## 2022-12-29 NOTE — Telephone Encounter (Signed)
IC advised.  

## 2023-01-05 ENCOUNTER — Telehealth: Payer: Self-pay

## 2023-01-05 ENCOUNTER — Ambulatory Visit (HOSPITAL_COMMUNITY)
Admission: RE | Admit: 2023-01-05 | Discharge: 2023-01-05 | Disposition: A | Discharge status: Home / Self Care | Payer: PPO | Source: Ambulatory Visit | Attending: Cardiology | Admitting: Cardiology

## 2023-01-05 ENCOUNTER — Encounter (HOSPITAL_COMMUNITY): Payer: Self-pay

## 2023-01-05 DIAGNOSIS — I2510 Atherosclerotic heart disease of native coronary artery without angina pectoris: Secondary | ICD-10-CM

## 2023-01-05 DIAGNOSIS — I509 Heart failure, unspecified: Secondary | ICD-10-CM

## 2023-01-05 NOTE — Telephone Encounter (Signed)
-----   Message from Leeroy Bock, [place] sent at 01/05/2023  2:45 PM EST ----- Regarding: RE: Initiating PC2K9i I'd have her referred to lipid clinic to discuss PCSK9i, thanks! ----- Message ----- From: Joni Reining, RN Sent: 01/05/2023   2:11 PM EST To: [facility] Pharmd Subject: Initiating PC2K9i                              Hello, Would this be a lipid clinic referral or is there one you would recommend we go ahead and order for this lady?  Thanks, Danae Chen ----- Message ----- From: Werner Lean, MD Sent: 01/05/2023   1:55 PM EST To: Haywood Pao, MD; Joni Reining, RN  Results: High CAC score Plan: Should qualify for [id number]  Werner Lean, MD

## 2023-01-05 NOTE — Telephone Encounter (Signed)
Reviewed results with patient, who demonstrates elevated calcium score. Discussed recommendations for referral to lipid clinic. Patient verbalizes understanding and agrees to plan, orders placed.

## 2023-01-08 ENCOUNTER — Telehealth: Payer: Self-pay | Admitting: *Deleted

## 2023-01-08 DIAGNOSIS — I2510 Atherosclerotic heart disease of native coronary artery without angina pectoris: Secondary | ICD-10-CM

## 2023-01-08 DIAGNOSIS — I10 Essential (primary) hypertension: Secondary | ICD-10-CM

## 2023-01-08 DIAGNOSIS — G4733 Obstructive sleep apnea (adult) (pediatric): Secondary | ICD-10-CM

## 2023-01-08 DIAGNOSIS — I509 Heart failure, unspecified: Secondary | ICD-10-CM

## 2023-01-08 NOTE — Telephone Encounter (Signed)
The patient has been notified of the result. Left detailed message on voicemail and informed patient to call back..Yazlin Ekblad Green, CMA   

## 2023-01-08 NOTE — Telephone Encounter (Signed)
-----   Message from Lauralee Evener, [place] sent at 12/21/2022  8:41 AM EST -----  ----- Message ----- From: Sueanne Margarita, MD Sent: 12/19/2022   9:33 AM EST To: [facility] Sleep Studies  Please let patient know that they have sleep apnea.  Recommend therapeutic CPAP titration for treatment of patient's sleep disordered breathing.  If unable to perform an in lab titration then initiate ResMed auto CPAP from 4 to 15cm H2O with heated humidity and mask of choice and overnight pulse ox on CPAP.

## 2023-01-08 NOTE — Telephone Encounter (Signed)
The patient has been notified of the result and verbalized understanding.  All questions (if any) were answered. Marolyn Hammock, [place] 01/08/2023 [id number] PM    Will precert titration

## 2023-01-13 ENCOUNTER — Ambulatory Visit (INDEPENDENT_AMBULATORY_CARE_PROVIDER_SITE_OTHER): Payer: PPO | Admitting: Orthopedic Surgery

## 2023-01-13 ENCOUNTER — Encounter: Payer: Self-pay | Admitting: Orthopedic Surgery

## 2023-01-13 DIAGNOSIS — M79604 Pain in right leg: Secondary | ICD-10-CM | POA: Diagnosis not present

## 2023-01-13 DIAGNOSIS — M47816 Spondylosis without myelopathy or radiculopathy, lumbar region: Secondary | ICD-10-CM

## 2023-01-13 NOTE — Progress Notes (Signed)
Office Visit Note   Patient: [name]           Date of Birth: 06/24/1956           MRN: [id number] Visit Date: 01/13/2023 Requested by: Haywood Pao, MD [address] [place],  [place] [address] PCP: Osborne Casco Fransico Him, MD  Subjective: Chief Complaint  Patient presents with   Other    Bilateral thigh pain    HPI: [name] is a 67 y.o. female who presents to the office reporting right thigh pain.  Describes ongoing deep thigh pain for several months.  Back shots helped her.  Ablation also helped her.  She was diagnosed with a T12 compression fracture 723.  Aggravation of psoriatic symptoms has caused her thigh pain to increase.  Takes tizanidine for symptoms.  The pain is migrating but it is improving.  She denies any discrete back pain.  Denies any fevers chills or weakness.  She is off statins.  The pain waking her from sleep has gotten better..                ROS: All systems reviewed are negative as they relate to the chief complaint within the history of present illness.  Patient denies fevers or chills.  Assessment & Plan: Visit Diagnoses:  1. Spondylosis without myelopathy or radiculopathy, lumbar region     Plan: Impression is right thigh pain.  Patient had ablation with Dr. Ernestina Cameron 11/11/2022.  Facet arthritis was present.  Conceivably the compression deformity in the spine could be worsening but MRI scan will be next if her symptoms do not improve.  Tizanidine refilled.  Follow-up with [name] as needed.  Follow-Up Instructions: No follow-ups on file.   Orders:  No orders of the defined types were placed in this encounter.  No orders of the defined types were placed in this encounter.     Procedures: No procedures performed   Clinical Data: No additional findings.  Objective: Vital Signs: There were no vitals taken for this visit.  Physical Exam:  Constitutional: Patient appears well-developed HEENT:  Head:  Normocephalic Eyes:EOM are normal Neck: Normal range of motion Cardiovascular: Normal rate Pulmonary/chest: Effort normal Neurologic: Patient is alert Skin: Skin is warm Psychiatric: Patient has normal mood and affect  Ortho Exam: Ortho exam demonstrates normal gait alignment.  Patient has 5 out of 5 ankle dorsiflexion plantarflexion quad and hamstring strength.  Pedal pulses palpable.  No muscle atrophy in either leg.  Mild pain with forward lateral bending.  No trochanteric tenderness bilaterally.  No definite paresthesias L1-S1 bilaterally.  Specialty Comments:  MRI LUMBAR SPINE WITHOUT CONTRAST   TECHNIQUE: Multiplanar, multisequence MR imaging of the lumbar spine was performed. No intravenous contrast was administered.   COMPARISON:  Radiographs dated December 31, 2021   FINDINGS: Segmentation:  Standard.   Alignment: Mild acute kyphotic angulation at T11-T12 due to compression fracture of T12. Mild anterolisthesis of L4.   Vertebrae: Compression deformity of T12 vertebral body with approximately 50% vertebral body height loss, without evidence of edema likely a chronic process.   Conus medullaris and cauda equina: Conus extends to the L1-L2 level. Conus and cauda equina appear normal.   Paraspinal and other soft tissues: Nonspecific subcutaneous soft tissue edema.   Disc spaces:   T11-T12: Disc height loss with disc protrusion into the central canal. Mild central canal narrowing. No significant neural foraminal narrowing. Mild facet joint arthropathy.   T12-L1: No significant disc  bulge. No neural foraminal stenosis. No central canal stenosis.   L1-L2: No significant disc bulge. No neural foraminal stenosis. No central canal stenosis.   L2-L3: No significant disc bulge. No neural foraminal stenosis. No central canal stenosis. Bilateral facet joint arthropathy, left worse than the right.   L3-L4: Mild disc protrusion with mild narrowing of lateral  recesses bilaterally. Mild ligamentum flavum hypertrophy. Moderate facet joint arthropathy. No significant neural foraminal stenosis.   L4-L5: Moderate circumferential disc protrusion and ligamentum flavum hypertrophy. Mild narrowing of lateral recesses bilaterally. Moderate facet joint arthropathy. No significant neural foraminal stenosis.   L5-S1: Disc height loss and broad-base disc protrusion. Narrowing of bilateral lateral recesses. Moderate facet joint hypertrophy. Mild bilateral neural foraminal stenosis, right worse than the left.   IMPRESSION: 1. Compression deformity of T12 vertebral body with approximately 50% vertebral body height loss, likely a chronic process.   2. Multilevel degenerate disc disease most prominent at L4-L5 and L5-S1 with mild bilateral neural foraminal stenosis at L5-S1.     Electronically Signed   By: Keane Police D.O.   On: 01/05/2022 09:00  Imaging: No results found.   PMFS History: Patient Active Problem List   Diagnosis Date Noted   Abscess of vulva 11/15/2018   Anxiety 11/15/2018   Insomnia 11/15/2018   Chronic diastolic heart failure ([facility]) 04/18/2018   Benign essential HTN 04/18/2018   Congestive heart failure ([facility]) 03/23/2018   Mild dilation of ascending aorta (HCC)    Elevated blood pressure reading without diagnosis of hypertension    Abnormal EKG 02/07/2016   Edema extremities 02/07/2016   Joint pain 03/19/2014   Paroxysmal supraventricular tachycardia 12/22/2013   Breast cancer of upper-outer quadrant of left female breast ([facility]) 09/19/2013   Vaginal atrophy 08/18/2013   Vaginitis and vulvovaginitis 08/18/2013   Pain, pelvic, female 08/18/2013   Hypothyroidism, postsurgical 04/03/2013   Past Medical History:  Diagnosis Date   Anxiety    Arthritis    lower back and thumbs   Breast cancer of upper-outer quadrant of left female breast ([facility]) 09/19/2013   Cancer ([facility])    left breast-(12-12-09 radiation and surgery)-Dr.  Jana Hakim -oncology   Chronic diastolic CHF (congestive heart failure) (HCC)    Colon polyps    sm aden p 10/06 (difficult exam, needs propofol);neg 07/2010   Dyslipidemia    Edema extremities 02/07/2016   GERD (gastroesophageal reflux disease)    [facility]'s [facility]), neg for metaplasia '97, '08 (mod HH) 9.2011   HTN (hypertension)    Hypothyroidism, postsurgical 04/03/2013   Laryngitis 12-20-12   recent episode is resolving with Amoxicillin orally   Mild dilation of ascending aorta (HCC)    Obesity    Osteoporosis    Paroxysmal supraventricular tachycardia 12/22/2013   Personal history of radiation therapy    2011   PONV (postoperative nausea and vomiting)    Thyroid adenoma 12-20-12   surgery planned 12-30-12   Vaginal atrophy 08/18/2013   Vaginitis and vulvovaginitis 08/18/2013    Family History  Problem Relation Age of Onset   Stroke Mother    Heart disease Father    Cancer Brother        melanoma    Past Surgical History:  Procedure Laterality Date   BREAST LUMPECTOMY Left    2011   BREAST SURGERY     left breast lumpectomy   EYE SURGERY  12-20-12   right eye retina tear repair   THYROIDECTOMY N/A 12/30/2012   Procedure: THYROIDECTOMY;  Surgeon: Merlinda Frederick  Gerkin, MD;  Location: [facility];  Service: General;  Laterality: N/A;   Social History   Occupational History   Not on file  Tobacco Use   Smoking status: Former    Types: Cigarettes    Quit date: 01/31/2010    Years since quitting: 12.9   Smokeless tobacco: Never  Vaping Use   Vaping Use: Never used  Substance and Sexual Activity   Alcohol use: Yes    Comment: social- occ   Drug use: No   Sexual activity: Yes    Birth control/protection: Post-menopausal

## 2023-01-16 ENCOUNTER — Encounter: Payer: Self-pay | Admitting: Orthopedic Surgery

## 2023-01-16 MED ORDER — TIZANIDINE HCL 2 MG PO TABS: 2.0000 mg | ORAL_TABLET | Freq: Three times a day (TID) | ORAL | 0 refills | Status: DC | PRN

## 2023-01-19 ENCOUNTER — Ambulatory Visit: Payer: PPO | Attending: Cardiovascular Disease | Admitting: Pharmacist

## 2023-01-19 ENCOUNTER — Telehealth: Payer: Self-pay | Admitting: Pharmacist

## 2023-01-19 ENCOUNTER — Encounter: Payer: Self-pay | Admitting: Pharmacist

## 2023-01-19 VITALS — BP 118/78 | HR 63 | Wt 236.0 lb

## 2023-01-19 DIAGNOSIS — I5032 Chronic diastolic (congestive) heart failure: Secondary | ICD-10-CM

## 2023-01-19 DIAGNOSIS — G720 Drug-induced myopathy: Secondary | ICD-10-CM

## 2023-01-19 DIAGNOSIS — R931 Abnormal findings on diagnostic imaging of heart and coronary circulation: Secondary | ICD-10-CM | POA: Insufficient documentation

## 2023-01-19 DIAGNOSIS — I10 Essential (primary) hypertension: Secondary | ICD-10-CM

## 2023-01-19 DIAGNOSIS — I2510 Atherosclerotic heart disease of native coronary artery without angina pectoris: Secondary | ICD-10-CM

## 2023-01-19 DIAGNOSIS — T466X5A Adverse effect of antihyperlipidemic and antiarteriosclerotic drugs, initial encounter: Secondary | ICD-10-CM | POA: Insufficient documentation

## 2023-01-19 DIAGNOSIS — I509 Heart failure, unspecified: Secondary | ICD-10-CM

## 2023-01-19 NOTE — Patient Instructions (Addendum)
It was nice meeting you today  We would like your blood pressure to stay less than 130/80 and your LDL (bad cholesterol) to stay less than 70  For your heart, please continue your diltiazem and furosemide.  We would like to start a new medication called Jardiance which is 10mg  once a day in the morning  For your cholesterol we would like to start a medication called Repatha which you would inject once every 2 weeks  I will complete the prior authorization for you and contact you when it is approved  Once you start the medication we will recheck your cholesterol in 2-3 months  Karren Cobble, PharmD, [place], [place], [place], [place], [place], [address] Phone: [phone], Fax: [phone]

## 2023-01-19 NOTE — Progress Notes (Signed)
Patient ID: [name]                 DOB: [birth year]                      MRN: [id number]     HPI: [name] is a 67 y.o. female referred by Dr. Radford Pax to pharmacy clinic for HF medication management and CAD management. Patient is statin intolerant and has HFpEF. PMH is also significant HTN, hypothyroidsim and breast cancer.  Most recent LVEF 60-65% on 06/06/2019. Coronary CTA showed Coronary calcium score of 860. This was 24 percentile for age-, race-, and sex-matched controls.  Patinet presents today in good spirits. Reports SOB most days with exertion but it is hard for her to predict. Has contant bilateral edema. Wears compression stockings. Takes furosemide 20mg  daily.  Recently diagnosed with OSA, not treated yet. Reports home blood pressures have been controlled usually around 120/80. Denies chest pain but has occasional dizziness.   Has tried pravastatin in the past which caused myalgias. Myalgias stopped when she discontinued medication and then began again when she restarted. No longer can tolerate statins.  Last BMP normal on 12/18/22.  Current CHF meds:  Furosemide 20mg  daily  Current HLD meds: N/A  BP goal: <130/80  LDL goal: <70  Last lipid panel:  TC 188, Trigs 144, HDL 65, LDL 95   Wt Readings from Last 3 Encounters:  12/18/22 238 lb 12.8 oz (108.3 kg)  12/07/22 239 lb (108.4 kg)  12/09/21 247 lb (112 kg)   BP Readings from Last 3 Encounters:  12/18/22 122/72  12/07/22 134/78  11/11/22 (!) 162/85   Pulse Readings from Last 3 Encounters:  12/18/22 72  12/07/22 63  11/11/22 65    Renal function: CrCl cannot be calculated (Patient's most recent lab result is older than the maximum 21 days allowed.).  Past Medical History:  Diagnosis Date   Anxiety    Arthritis    lower back and thumbs   Breast cancer of upper-outer quadrant of left female breast ([place]) 09/19/2013   Cancer ([place])    left breast-(12-12-09 radiation and  surgery)-Dr. Jana Hakim -oncology   Chronic diastolic CHF (congestive heart failure) (HCC)    Colon polyps    sm aden p 10/06 (difficult exam, needs propofol);neg 07/2010   Dyslipidemia    Edema extremities 02/07/2016   GERD (gastroesophageal reflux disease)    Barrett's [facility]), neg for metaplasia '97, '08 (mod HH) 9.2011   HTN (hypertension)    Hypothyroidism, postsurgical 04/03/2013   Laryngitis 12-20-12   recent episode is resolving with Amoxicillin orally   Mild dilation of ascending aorta (HCC)    Obesity    Osteoporosis    Paroxysmal supraventricular tachycardia 12/22/2013   Personal history of radiation therapy    2011   PONV (postoperative nausea and vomiting)    Thyroid adenoma 12-20-12   surgery planned 12-30-12   Vaginal atrophy 08/18/2013   Vaginitis and vulvovaginitis 08/18/2013    Current Outpatient Medications on File Prior to Visit  Medication Sig Dispense Refill   betamethasone dipropionate 0.05 % cream Apply topically.     fluticasone (CUTIVATE) 0.05 % cream Apply topically 2 (two) times daily as needed.     alendronate (FOSAMAX) 70 MG tablet Take 70 mg by mouth once a week.     ALPRAZolam (XANAX) 1 MG tablet Take 1 mg by mouth at bedtime as needed for sleep.  aspirin EC 81 MG tablet Take 81 mg by mouth every other day. Sundays and Thursdays     Calcium Carbonate (CALCIUM 600 PO) Take 1 tablet by mouth daily.     diltiazem (CARDIZEM CD) 120 MG 24 hr capsule TAKE 1 CAPSULE BY MOUTH ONCE DAILY IN THE MORNING . 90 capsule 3   folic acid (FOLVITE) 1 MG tablet Take 1 mg by mouth daily.     furosemide (LASIX) 20 MG tablet Take 1 tablet (20 mg total) by mouth daily. 90 tablet 3   hydrOXYzine (VISTARIL) 25 MG capsule Take 1 capsule by mouth daily as needed. itching     methotrexate (RHEUMATREX) 2.5 MG tablet Take 10 mg by mouth once a week.     Multiple Vitamin (MULTIVITAMIN ADULT PO) Take 1 Dose by mouth daily. 2 gummies daily     Omega 3 1000 MG CAPS Take 2,000 mg by  mouth daily.     omeprazole (PRILOSEC) 40 MG capsule Take 40 mg by mouth daily.     pantoprazole (PROTONIX) 40 MG tablet Take 1 tablet (40 mg total) daily by mouth. 90 tablet 3   sertraline (ZOLOFT) 100 MG tablet Take 150 mg by mouth daily.     SYNTHROID 112 MCG tablet Take 224 mcg by mouth every morning.     tiZANidine (ZANAFLEX) 2 MG tablet Take 1 tablet (2 mg total) by mouth every 8 (eight) hours as needed for muscle spasms. 15 tablet 0   Vitamin D-Vitamin K (VITAMIN K2-VITAMIN D3 PO) Take 1 capsule by mouth daily.     No current facility-administered medications on file prior to visit.    Allergies  Allergen Reactions   Pravastatin Sodium Other (See Comments)    myalgias   Sulfa Antibiotics Hives   Sulfa Drugs Cross Reactors Hives   Venlafaxine      Assessment/Plan:  1. CHF -  Patient BP in room 118/78 which is at goal of <130/80. Currently only on furosemide. Recommend starting SGLT2i once daily in the morning for GDMT. Discussed mechanism and possible adverse effects. Patient agreeable.  2. CAD - Patient LDL 95 which is above goal of <70 due to elevated coronary calcium score. Since patient statin intolerant, recommend starting PCSK9i. Using demo pen, educated patient on mechanism of action, storage, site selection, administration, and possible adverse effects. Patient was able to demonstrate use in room using demo pen.  Will complete PA for Jardiance and Repatha and contact patient when approved. Recheck lipid panel in 2-3 months.  Continue furosemide 20mg  daily Start Jardiance 10mg  daily Start Repatha 140mg  q 2 weeks Recheck lipid paneil in 2-3 months  Karren Cobble, PharmD, [place], [place], [place], [place], [place], [address] Phone: [phone], Fax: [phone]   [employer] [facility]

## 2023-01-19 NOTE — Telephone Encounter (Signed)
Please complete PA for Jardiance 10mg  once daily and Repatha 140mg  q 2 weeks

## 2023-01-20 DIAGNOSIS — E039 Hypothyroidism, unspecified: Secondary | ICD-10-CM | POA: Diagnosis not present

## 2023-01-20 DIAGNOSIS — R7989 Other specified abnormal findings of blood chemistry: Secondary | ICD-10-CM | POA: Diagnosis not present

## 2023-01-20 DIAGNOSIS — E7800 Pure hypercholesterolemia, unspecified: Secondary | ICD-10-CM | POA: Diagnosis not present

## 2023-01-20 DIAGNOSIS — I519 Heart disease, unspecified: Secondary | ICD-10-CM | POA: Diagnosis not present

## 2023-01-21 ENCOUNTER — Telehealth: Payer: Self-pay

## 2023-01-21 ENCOUNTER — Other Ambulatory Visit (HOSPITAL_COMMUNITY): Payer: Self-pay

## 2023-01-21 NOTE — Telephone Encounter (Signed)
Pharmacy Patient Advocate Encounter   Received notification from PharmD that prior authorization for Repatha 140mg /ml is required/requested.     PA submitted on 3.21.24 to (ins) Health Team ADV via CoverMyMeds Key or (Medicaid) confirmation # [id number]   Status is pending

## 2023-01-21 NOTE — Telephone Encounter (Signed)
Pharmacy Patient Advocate Encounter     Per Test Claim: No p/a is required

## 2023-01-21 NOTE — Telephone Encounter (Signed)
Pharmacy Patient Advocate Encounter  Prior Authorization for Repatha 140mg /ml has been approved by Health team adv (ins).    PA # [id number] Effective dates: 3.21.24 through 9.21.24

## 2023-01-22 MED ORDER — REPATHA SURECLICK 140 MG/ML ~~LOC~~ SOAJ: 1.0000 mL | SUBCUTANEOUS | 3 refills | Status: DC

## 2023-01-22 MED ORDER — EMPAGLIFLOZIN 10 MG PO TABS: ORAL_TABLET | ORAL | 1 refills | Status: DC

## 2023-01-26 NOTE — Addendum Note (Signed)
Addended by: Freada Bergeron on: 01/26/2023 01:00 PM   Modules accepted: Orders

## 2023-01-27 DIAGNOSIS — L400 Psoriasis vulgaris: Secondary | ICD-10-CM | POA: Diagnosis not present

## 2023-02-01 ENCOUNTER — Other Ambulatory Visit: Payer: Self-pay | Admitting: Orthopedic Surgery

## 2023-02-01 DIAGNOSIS — M79604 Pain in right leg: Secondary | ICD-10-CM

## 2023-02-08 DIAGNOSIS — B0089 Other herpesviral infection: Secondary | ICD-10-CM | POA: Diagnosis not present

## 2023-02-10 ENCOUNTER — Other Ambulatory Visit: Payer: Self-pay | Admitting: Cardiology

## 2023-02-14 ENCOUNTER — Other Ambulatory Visit: Payer: Self-pay | Admitting: Cardiology

## 2023-02-18 ENCOUNTER — Ambulatory Visit (HOSPITAL_BASED_OUTPATIENT_CLINIC_OR_DEPARTMENT_OTHER): Payer: PPO | Attending: Cardiology | Admitting: Cardiology

## 2023-02-18 VITALS — Ht 66.0 in | Wt 230.0 lb

## 2023-02-18 DIAGNOSIS — G4733 Obstructive sleep apnea (adult) (pediatric): Secondary | ICD-10-CM | POA: Diagnosis not present

## 2023-02-18 DIAGNOSIS — I10 Essential (primary) hypertension: Secondary | ICD-10-CM | POA: Diagnosis not present

## 2023-02-21 NOTE — Procedures (Signed)
   Patient Name: [name] Study Date: 02/18/2023 Gender: Female D.O.B: [birth year] Age (years): 31 Referring Provider: Armanda Magic MD, ABSM Height (inches): 66 Interpreting Physician: Armanda Magic MD, ABSM Weight (lbs): 230 RPSGT: Armen Pickup BMI: 37 MRN: [id number] Neck Size: 14.50  CLINICAL INFORMATION The patient is referred for a CPAP titration to treat sleep apnea.  SLEEP STUDY TECHNIQUE As per the AASM Manual for the Scoring of Sleep and Associated Events v2.3 (April 2016) with a hypopnea requiring 4% desaturations.  The channels recorded and monitored were frontal, central and occipital EEG, electrooculogram (EOG), submentalis EMG (chin), nasal and oral airflow, thoracic and abdominal wall motion, anterior tibialis EMG, snore microphone, electrocardiogram, and pulse oximetry. Continuous positive airway pressure (CPAP) was initiated at the beginning of the study and titrated to treat sleep-disordered breathing.  MEDICATIONS Medications self-administered by patient taken the night of the study : N/A  TECHNICIAN COMMENTS Comments added by technician: Pt had one restroom visted. Patient had difficulty initiating sleep. Patient was restless all through the night. Comments added by scorer: N/A  RESPIRATORY PARAMETERS Optimal PAP Pressure (cm):6  AHI at Optimal Pressure (/hr):0 Overall Minimal O2 (%):90.0  Supine % at Optimal Pressure (%):7 Minimal O2 at Optimal Pressure (%): 90.0   SLEEP ARCHITECTURE The study was initiated at 10:29:54 PM and ended at 4:24:49 AM.  Sleep onset time was 67.8 minutes and the sleep efficiency was 53.3%. The total sleep time was 189 minutes.  The patient spent 9.3% of the night in stage N1 sleep, 90.7% in stage N2 sleep, 0.0% in stage N3 and 0% in REM.Stage REM latency was N/A minutes  Wake after sleep onset was 98.1. Alpha intrusion was absent. Supine sleep was 7.41%.  CARDIAC DATA The 2 lead EKG demonstrated sinus rhythm. The  mean heart rate was 64.7 beats per minute. Other EKG findings include: None. LEG MOVEMENT DATA The total Periodic Limb Movements of Sleep (PLMS) were 0. The PLMS index was 0.0. A PLMS index of <15 is considered normal in adults.  IMPRESSIONS - The optimal PAP pressure was 6 cm of water. - Significant oxygen desaturations were not observed during this titration (min O2 = 90.0%). - No snoring was audible during this study. - No cardiac abnormalities were observed during this study. - Clinically significant periodic limb movements were not noted during this study. Arousals associated with PLMs were rare.  DIAGNOSIS - Obstructive Sleep Apnea (G47.33)  RECOMMENDATIONS - Trial of ResMed CPAP therapy on 6 cm H2O with a Standard size Resmed Nasal Mirage FX for Her mask and heated humidification. - Avoid alcohol, sedatives and other CNS depressants that may worsen sleep apnea and disrupt normal sleep architecture. - Sleep hygiene should be reviewed to assess factors that may improve sleep quality. - Weight management and regular exercise should be initiated or continued. - Return to Sleep Center for re-evaluation after 4 weeks of therapy  [Electronically signed] 02/22/2023 09:27 AM  Armanda Magic MD, ABSM Diplomate, American Board of Sleep Medicine

## 2023-03-01 ENCOUNTER — Telehealth: Payer: Self-pay | Admitting: *Deleted

## 2023-03-01 NOTE — Telephone Encounter (Signed)
The patient has been notified of the result. Left detailed message on voicemail and informed patient to call back..Konnar Ben Green, CMA   

## 2023-03-01 NOTE — Telephone Encounter (Signed)
-----   Message from Gaynelle Cage, [place] sent at 02/22/2023  9:40 AM EDT -----  ----- Message ----- From: Quintella Reichert, MD Sent: 02/22/2023   9:29 AM EDT To: Cv Div Sleep Studies  Please let patient know that they had a successful PAP titration and let DME know that orders are in EPIC.  Please set up 6 week OV with me.

## 2023-03-02 NOTE — Telephone Encounter (Signed)
The patient has been notified of the result. Left detailed message on voicemail and informed patient to call back..Chrishawn Boley Green, CMA   

## 2023-03-09 NOTE — Telephone Encounter (Signed)
The patient has been notified of the result and verbalized understanding.  All questions (if any) were answered. [name], CMA 03/09/2023 6:20 PM     Upon patient request DME selection is [facility] Patient understands he will be contacted by [facility] to set up his cpap. Patient understands to call if [facility] does not contact him with new setup in a timely manner. Patient understands they will be called once confirmation has been received from [facility] that they have received their new machine to schedule 10 week follow up appointment. [facility] notified of new cpap order  Please add to airview Patient was grateful for the call and thanked me

## 2023-03-25 DIAGNOSIS — K219 Gastro-esophageal reflux disease without esophagitis: Secondary | ICD-10-CM | POA: Diagnosis not present

## 2023-03-25 DIAGNOSIS — M810 Age-related osteoporosis without current pathological fracture: Secondary | ICD-10-CM | POA: Diagnosis not present

## 2023-03-25 DIAGNOSIS — F419 Anxiety disorder, unspecified: Secondary | ICD-10-CM | POA: Diagnosis not present

## 2023-03-25 DIAGNOSIS — Z1212 Encounter for screening for malignant neoplasm of rectum: Secondary | ICD-10-CM | POA: Diagnosis not present

## 2023-03-25 DIAGNOSIS — E039 Hypothyroidism, unspecified: Secondary | ICD-10-CM | POA: Diagnosis not present

## 2023-03-25 DIAGNOSIS — I519 Heart disease, unspecified: Secondary | ICD-10-CM | POA: Diagnosis not present

## 2023-03-25 DIAGNOSIS — E7800 Pure hypercholesterolemia, unspecified: Secondary | ICD-10-CM | POA: Diagnosis not present

## 2023-03-25 DIAGNOSIS — R7989 Other specified abnormal findings of blood chemistry: Secondary | ICD-10-CM | POA: Diagnosis not present

## 2023-04-01 DIAGNOSIS — I519 Heart disease, unspecified: Secondary | ICD-10-CM | POA: Diagnosis not present

## 2023-04-01 DIAGNOSIS — C50912 Malignant neoplasm of unspecified site of left female breast: Secondary | ICD-10-CM | POA: Diagnosis not present

## 2023-04-01 DIAGNOSIS — I471 Supraventricular tachycardia, unspecified: Secondary | ICD-10-CM | POA: Diagnosis not present

## 2023-04-01 DIAGNOSIS — Z1331 Encounter for screening for depression: Secondary | ICD-10-CM | POA: Diagnosis not present

## 2023-04-01 DIAGNOSIS — I2510 Atherosclerotic heart disease of native coronary artery without angina pectoris: Secondary | ICD-10-CM | POA: Diagnosis not present

## 2023-04-01 DIAGNOSIS — E7800 Pure hypercholesterolemia, unspecified: Secondary | ICD-10-CM | POA: Diagnosis not present

## 2023-04-01 DIAGNOSIS — I2584 Coronary atherosclerosis due to calcified coronary lesion: Secondary | ICD-10-CM | POA: Diagnosis not present

## 2023-04-01 DIAGNOSIS — Z0000 Encounter for general adult medical examination without abnormal findings: Secondary | ICD-10-CM | POA: Diagnosis not present

## 2023-04-01 DIAGNOSIS — J309 Allergic rhinitis, unspecified: Secondary | ICD-10-CM | POA: Diagnosis not present

## 2023-04-01 DIAGNOSIS — R82998 Other abnormal findings in urine: Secondary | ICD-10-CM | POA: Diagnosis not present

## 2023-04-01 DIAGNOSIS — M810 Age-related osteoporosis without current pathological fracture: Secondary | ICD-10-CM | POA: Diagnosis not present

## 2023-04-01 DIAGNOSIS — E039 Hypothyroidism, unspecified: Secondary | ICD-10-CM | POA: Diagnosis not present

## 2023-04-01 DIAGNOSIS — Z1339 Encounter for screening examination for other mental health and behavioral disorders: Secondary | ICD-10-CM | POA: Diagnosis not present

## 2023-04-01 DIAGNOSIS — I77810 Thoracic aortic ectasia: Secondary | ICD-10-CM | POA: Diagnosis not present

## 2023-04-02 LAB — LAB REPORT - SCANNED: EGFR: 96.1

## 2023-04-07 ENCOUNTER — Other Ambulatory Visit (HOSPITAL_BASED_OUTPATIENT_CLINIC_OR_DEPARTMENT_OTHER): Payer: Self-pay | Admitting: Cardiology

## 2023-04-07 ENCOUNTER — Ambulatory Visit: Payer: PPO | Attending: Cardiology

## 2023-04-07 DIAGNOSIS — I2510 Atherosclerotic heart disease of native coronary artery without angina pectoris: Secondary | ICD-10-CM

## 2023-04-07 DIAGNOSIS — R931 Abnormal findings on diagnostic imaging of heart and coronary circulation: Secondary | ICD-10-CM

## 2023-04-08 ENCOUNTER — Telehealth: Payer: Self-pay

## 2023-04-08 LAB — LIPID PANEL
Chol/HDL Ratio: 2.9 ratio (ref 0.0–4.4)
Cholesterol, Total: 173 mg/dL (ref 100–199)
HDL: 60 mg/dL (ref >39)
LDL Chol Calc (NIH): 97 mg/dL (ref 0–99)
Triglycerides: 84 mg/dL (ref 0–149)
VLDL Cholesterol Cal: 16 mg/dL (ref 5–40)

## 2023-04-08 NOTE — Telephone Encounter (Signed)
Patient called to ask for advice regarding her jardiance and repatha. She states both medications have been working great for her but she is not sure she can afford both. She is wondering if there any alternatives to these medications that would help her reduce her costs. She expressed she might be willing to consider going back on a statin in order to reduce her medication costs and states she wants to talk to Dr. Mayford Knife and Laural Golden Rph about her options. Forwarded to both.

## 2023-04-08 NOTE — Telephone Encounter (Signed)
-----   Message from Quintella Reichert, MD sent at 04/08/2023  8:51 AM EDT ----- Please let patient know that labs were normal.  Continue current medical therapy.

## 2023-04-08 NOTE — Telephone Encounter (Addendum)
Will sign up patient for grant assistance for Repatha. Recommend applying for patient assistance for Jardiance.  Sent [employer] with grant information.

## 2023-04-08 NOTE — Telephone Encounter (Signed)
Discussed lab results with patient, who verbalizes understanding to continue medical therpay.

## 2023-04-26 DIAGNOSIS — R194 Change in bowel habit: Secondary | ICD-10-CM | POA: Diagnosis not present

## 2023-04-26 DIAGNOSIS — R195 Other fecal abnormalities: Secondary | ICD-10-CM | POA: Diagnosis not present

## 2023-05-04 DIAGNOSIS — R195 Other fecal abnormalities: Secondary | ICD-10-CM | POA: Diagnosis not present

## 2023-05-19 ENCOUNTER — Telehealth: Payer: Self-pay

## 2023-05-19 NOTE — Telephone Encounter (Signed)
**Note De-Identified [name] Obfuscation** I received a completed [employer] pt asst application for Jardiance [name] e-mail from Dr Malachy Mood nurse that was already signed and dated by Dr Mayford Knife.  I completed the providers page of the application with Dr Malachy Mood information and I then faxed all to [facility]. Fax: Tx 'ok' Report CONE_EMAIL-to-Fax  [name], [name] This message was sent [name], a product from [employer]. http://www.biscom.com/ [employer], a [employer], pioneered [employer] and continues to innovate the most advanced and intelligent fax and [employer] for enterprises. www.biscom.com -------Fax Transmission Report------- To:               Recipient at [phone] Subject:          Fw: Jardiance Assistance Result:           The transmission was successful. Explanation:      All Pages Ok Pages Sent:       8 Connect Time:     6 minutes, 26 seconds Transmit Time:    05/19/2023 14:02 Transfer Rate:    14400 Status Code:      0000 Retry Count:      0 Job Id:           [id number] Unique Id:        [id number] Fax Line:         55 Fax Server:       [id number]

## 2023-05-21 DIAGNOSIS — G4733 Obstructive sleep apnea (adult) (pediatric): Secondary | ICD-10-CM | POA: Diagnosis not present

## 2023-05-25 NOTE — Telephone Encounter (Signed)
**Note De-Identified Marshaun Lortie Obfuscation** Letter received from [facility] Kiarah Eckstein fax stating that they have denied the pt for Jardiance assistance because her reported income exceeds current program eligibility limits.  The letter states that they have notified the pt of this denial as well.

## 2023-06-18 DIAGNOSIS — G4733 Obstructive sleep apnea (adult) (pediatric): Secondary | ICD-10-CM | POA: Diagnosis not present

## 2023-06-21 DIAGNOSIS — G4733 Obstructive sleep apnea (adult) (pediatric): Secondary | ICD-10-CM | POA: Diagnosis not present

## 2023-06-30 ENCOUNTER — Other Ambulatory Visit: Payer: Self-pay | Admitting: Internal Medicine

## 2023-06-30 DIAGNOSIS — Z1231 Encounter for screening mammogram for malignant neoplasm of breast: Secondary | ICD-10-CM

## 2023-07-12 DIAGNOSIS — D225 Melanocytic nevi of trunk: Secondary | ICD-10-CM | POA: Diagnosis not present

## 2023-07-12 DIAGNOSIS — D692 Other nonthrombocytopenic purpura: Secondary | ICD-10-CM | POA: Diagnosis not present

## 2023-07-12 DIAGNOSIS — L818 Other specified disorders of pigmentation: Secondary | ICD-10-CM | POA: Diagnosis not present

## 2023-07-12 DIAGNOSIS — L400 Psoriasis vulgaris: Secondary | ICD-10-CM | POA: Diagnosis not present

## 2023-07-12 DIAGNOSIS — L821 Other seborrheic keratosis: Secondary | ICD-10-CM | POA: Diagnosis not present

## 2023-07-13 DIAGNOSIS — K219 Gastro-esophageal reflux disease without esophagitis: Secondary | ICD-10-CM | POA: Diagnosis not present

## 2023-07-16 ENCOUNTER — Ambulatory Visit: Payer: PPO | Attending: Cardiology

## 2023-07-16 ENCOUNTER — Telehealth: Payer: Self-pay | Admitting: Pharmacist

## 2023-07-16 DIAGNOSIS — I2510 Atherosclerotic heart disease of native coronary artery without angina pectoris: Secondary | ICD-10-CM

## 2023-07-16 DIAGNOSIS — R931 Abnormal findings on diagnostic imaging of heart and coronary circulation: Secondary | ICD-10-CM

## 2023-07-16 NOTE — Telephone Encounter (Signed)
Placing order for fasting lipid panel per patient request

## 2023-07-17 LAB — LIPID PANEL
Chol/HDL Ratio: 3 ratio (ref 0.0–4.4)
Cholesterol, Total: 177 mg/dL (ref 100–199)
HDL: 60 mg/dL (ref >39)
LDL Chol Calc (NIH): 102 mg/dL — ABNORMAL HIGH (ref 0–99)
Triglycerides: 80 mg/dL (ref 0–149)
VLDL Cholesterol Cal: 15 mg/dL (ref 5–40)

## 2023-07-19 ENCOUNTER — Other Ambulatory Visit: Payer: Self-pay | Admitting: Cardiology

## 2023-07-19 DIAGNOSIS — I509 Heart failure, unspecified: Secondary | ICD-10-CM

## 2023-07-19 DIAGNOSIS — I5032 Chronic diastolic (congestive) heart failure: Secondary | ICD-10-CM

## 2023-07-22 DIAGNOSIS — G4733 Obstructive sleep apnea (adult) (pediatric): Secondary | ICD-10-CM | POA: Diagnosis not present

## 2023-07-23 ENCOUNTER — Telehealth: Payer: Self-pay

## 2023-07-23 DIAGNOSIS — E785 Hyperlipidemia, unspecified: Secondary | ICD-10-CM

## 2023-07-23 NOTE — Telephone Encounter (Signed)
-----   Message from Franchot Gallo sent at 07/22/2023  6:05 PM EDT -----  ----- Message ----- From: Quintella Reichert, MD Sent: 07/20/2023   3:58 PM EDT To: Franchot Gallo  Refer to lipid clinic ----- Message ----- From: Franchot Gallo Sent: 07/20/2023   3:40 PM EDT To: Quintella Reichert, MD  The patient has been notified of the result and verbalized understanding.  All questions (if any) were answered. Franchot Gallo, RN 07/20/2023 3:38 PM   Patient states she has not missed any doses of Repatha, and she was fasting. Patient is also concerned about cost of Jardiance ($400 for 64-month supply), she would like to discuss other options. Will forward to Dr. Mayford Knife to review and advise.

## 2023-07-23 NOTE — Telephone Encounter (Signed)
Patient agrees to lipid clinic referral, order placed.

## 2023-07-26 ENCOUNTER — Telehealth: Payer: Self-pay | Admitting: Cardiology

## 2023-07-26 NOTE — Progress Notes (Unsigned)
Virtual Visit via Video Note   Because of Idman Vittitoe [name]'s co-morbid illnesses, she is at least at moderate risk for complications without adequate follow up.  This format is felt to be most appropriate for this patient at this time.  All issues noted in this document were discussed and addressed.  A limited physical exam was performed with this format.  Please refer to the patient's chart for her consent to telehealth for [facility].       Date:  07/27/2023   ID:  Beckie Busing [name], DOB [birth year], MRN [id number] The patient was identified using 2 identifiers.  Patient Location: Home Provider Location: Home Office   PCP:  Tisovec, Adelfa Koh, MD   [facility] Providers Cardiologist:  Armanda Magic, MD     Evaluation Performed:  Follow-Up Visit  Chief Complaint: OSA  History of Present Illness:    [name] is a 67 y.o. female with a hx of PSVT, mildly dilated ascending aorta, thyroid adenoma, remote breast CA, former tobacco abuse, dyslipidemia (followed by PCP), GERD, recent mild HTN, obesity.  Her last echo on 09/16/2017 showed normal LV function with EF 60 to 65% with grade 2 diastolic dysfunction, mildly dilated ascending aorta at 38 mm.   She underwent a sleep study due to excessive daytime sleepiness and snoring with a STOP BANG SCORE of 5.  HST showed moderate OSA with an AHI of 23/hr and nocturnal hypoxemia.  She underwent CPAP titration to 6cm H2O.  She is now here for sleep followup.    She is doing well with her PAP device and thinks that she has gotten used to it.  She tolerates the nasal mask and feels the pressure is adequate.  Since going on PAP she feels rested in the am and has no significant daytime sleepiness.  She occasionally will take a nap if she is bored or it is raining. She denies any significant mouth or nasal dryness or nasal congestion.  She does not think that he snores.  She tells me that she is  dreaming again which has not occurred in years.   She is here today for followup and is doing well.  She denies any chest pain or pressure, PND, orthopnea, dizziness or syncope. She occasionally has some LE edema if she ate too much Na the day before. She has had some mild DOE but thinks it may be anxiety.  It is not every day and can walk to the mailbox and work out with no problems. She had 1 episode of SVT that terminated with vagal maneuvers. She is compliant with her meds and is tolerating meds with no SE.     Past Medical History:  Diagnosis Date   Anxiety    Arthritis    lower back and thumbs   Breast cancer of upper-outer quadrant of left female breast (HCC) 09/19/2013   Cancer (HCC)    left breast-(12-12-09 radiation and surgery)-Dr. Darnelle Catalan -oncology   Chronic diastolic CHF (congestive heart failure) (HCC)    Colon polyps    sm aden p 10/06 (difficult exam, needs propofol);neg 07/2010   Dyslipidemia    Edema extremities 02/07/2016   GERD (gastroesophageal reflux disease)    [facility]), neg for metaplasia '97, '08 (mod HH) 9.2011   HTN (hypertension)    Hypothyroidism, postsurgical 04/03/2013   Laryngitis 12-20-12   recent episode is resolving with Amoxicillin orally   Mild  dilation of ascending aorta (HCC)    Obesity    Osteoporosis    Paroxysmal supraventricular tachycardia 12/22/2013   Personal history of radiation therapy    2011   PONV (postoperative nausea and vomiting)    Thyroid adenoma 12-20-12   surgery planned 12-30-12   Vaginal atrophy 08/18/2013   Vaginitis and vulvovaginitis 08/18/2013   Past Surgical History:  Procedure Laterality Date   BREAST LUMPECTOMY Left    2011   BREAST SURGERY     left breast lumpectomy   EYE SURGERY  12-20-12   right eye retina tear repair   THYROIDECTOMY N/A 12/30/2012   Procedure: THYROIDECTOMY;  Surgeon: Velora Heckler, MD;  Location: [facility];  Service: General;  Laterality: N/A;     Current Meds  Medication Sig    alendronate (FOSAMAX) 70 MG tablet Take 70 mg by mouth once a week.   ALPRAZolam (XANAX) 1 MG tablet Take 1 mg by mouth at bedtime as needed for sleep.   aspirin EC 81 MG tablet Take 81 mg by mouth every other day. Sundays and Thursdays   betamethasone dipropionate 0.05 % cream Apply topically.   CRANBERRY PO Take 500 mg by mouth daily.   diltiazem (CARDIZEM CD) 120 MG 24 hr capsule Take 1 capsule by mouth once daily in the morning   empagliflozin (JARDIANCE) 10 MG TABS tablet TAKE 1 TABLET BY MOUTH ONCE DAILY IN THE MORNING   Evolocumab (REPATHA SURECLICK) 140 MG/ML SOAJ Inject 140 mg into the skin every 14 (fourteen) days.   fluticasone (CUTIVATE) 0.05 % cream Apply topically 2 (two) times daily as needed.   furosemide (LASIX) 20 MG tablet Take 1 tablet by mouth daily   hydrOXYzine (VISTARIL) 25 MG capsule Take 1 capsule by mouth daily as needed. itching   Multiple Vitamin (MULTIVITAMIN ADULT PO) Take 1 Dose by mouth daily. 2 gummies daily   Omega 3 1000 MG CAPS Take 2,000 mg by mouth daily.   pantoprazole (PROTONIX) 40 MG tablet Take 1 tablet (40 mg total) daily by mouth.   sertraline (ZOLOFT) 100 MG tablet Take 150 mg by mouth daily.   SYNTHROID 112 MCG tablet Take 224 mcg by mouth every morning.     Allergies:   Pravastatin sodium, Sulfa antibiotics, Sulfa drugs cross reactors, and Venlafaxine   Social History   Tobacco Use   Smoking status: Former    Current packs/day: 0.00    Types: Cigarettes    Quit date: 01/31/2010    Years since quitting: 13.4   Smokeless tobacco: Never  Vaping Use   Vaping status: Never Used  Substance Use Topics   Alcohol use: Yes    Comment: social- occ   Drug use: No     Family Hx: The patient's family history includes Cancer in her brother; Heart disease in her father; Stroke in her mother.  ROS:   Please see the history of present illness.     All other systems reviewed and are negative.   Prior Sleep studies:   The following studies were  reviewed today:  HST and PAP compliance download  Labs/Other Tests and Data Reviewed:     Recent Labs: 12/18/2022: BUN 13; Creatinine, Ser 0.84; Potassium 4.2; Sodium 141   Wt Readings from Last 3 Encounters:  07/27/23 222 lb (100.7 kg)  02/18/23 230 lb (104.3 kg)  01/19/23 236 lb (107 kg)     Risk Assessment/Calculations:      Objective:    Vital Signs:  Ht 5'  5" (1.651 m)   Wt 222 lb (100.7 kg)   BMI 36.94 kg/m    VITAL SIGNS:  reviewed GEN:  no acute distress EYES:  sclerae anicteric, EOMI - Extraocular Movements Intact RESPIRATORY:  normal respiratory effort, symmetric expansion CARDIOVASCULAR:  no peripheral edema SKIN:  no rash, lesions or ulcers. MUSCULOSKELETAL:  no obvious deformities. NEURO:  alert and oriented x 3, no obvious focal deficit PSYCH:  normal affect  ASSESSMENT & PLAN:    OSA - The patient is tolerating PAP therapy well without any problems. The PAP download performed by his DME was personally reviewed and interpreted by me today and showed an AHI of 0.9/hr on 6 cm H2O with 100% compliance in using more than 4 hours nightly.  The patient has been using and benefiting from PAP use and will continue to benefit from therapy.   HTN -BP controlled at home -continue prescription drug management with Cardizem CD 120mg  daily with PRN refills  Coronary Calcifications -coronary Ca score elevated at 860 in March 2024 -recommend Stress PET  to rule out silent ischemia given how high the Ca score is Informed Consent   Shared Decision Making/Informed Consent The risks [chest pain, shortness of breath, cardiac arrhythmias, dizziness, blood pressure fluctuations, myocardial infarction, stroke/transient ischemic attack, nausea, vomiting, allergic reaction, radiation exposure, metallic taste sensation and life-threatening complications (estimated to be 1 in 10,000)], benefits (risk stratification, diagnosing coronary artery disease, treatment guidance) and  alternatives of a cardiac PET stress test were discussed in detail with Ms. Zenaida Niece [name] and she agrees to proceed. -continue prescription drug management with ASA 81mg  daily, Repatha -statin intolerant  HLD -LDL goal < 70 -statin intolerant -I have personally reviewed and interpreted outside labs performed by patient's PCP which showed LDL 102, HDL 60 -continue prescription drug management with Repatha with PRN refills>>denies any missed doses>>refer back to lipid clinic  PSVT -she has only had 1 episode of SVT that terminated with vagal maneuvers -continue prescription drug management with Cardizem CD 120mg  daiy with PRN refills    Chronic diastolic CHF -she has chronic SOB and LE edema which are very stable -weight remains stable -she will continue to use compression hose during the day -continue prescription drug management with Lasix 20mg  daily with PRN refills -check BMET    Total time of encounter: 20 minutes total time of encounter, including 15 minutes spent in face-to-face patient care on the date of this encounter. This time includes coordination of care and counseling regarding above mentioned problem list. Remainder of non-face-to-face time involved reviewing chart documents/testing relevant to the patient encounter and documentation in the medical record. I have independently reviewed documentation from referring provider.    Medication Adjustments/Labs and Tests Ordered: Current medicines are reviewed at length with the patient today.  Concerns regarding medicines are outlined above.   Tests Ordered: No orders of the defined types were placed in this encounter.   Medication Changes: No orders of the defined types were placed in this encounter.   Follow Up:  In Person in 6 month(s)  Signed, Armanda Magic, MD  07/27/2023 10:07 AM    [facility]

## 2023-07-26 NOTE — Telephone Encounter (Signed)
Pt is requesting a callback regarding [facility] faxing over her results from recent labs done by their office so that she can go over them tomorrow at his appt with MD. Please advise

## 2023-07-26 NOTE — Telephone Encounter (Signed)
I spoke w the patient.  She wants to make sure that Dr. Mayford Knife and the pharmacist can see the list of all the lipid panels done this year from her PCP.  I was able to see they are scanned into her chart for Jan, March, May and now September.   She would like to discuss with either or both of these providers.  Has virtual w Dr. Mayford Knife tomorrow for sleep.

## 2023-07-27 ENCOUNTER — Telehealth: Payer: Self-pay | Admitting: *Deleted

## 2023-07-27 ENCOUNTER — Other Ambulatory Visit: Payer: Self-pay | Admitting: *Deleted

## 2023-07-27 ENCOUNTER — Ambulatory Visit: Payer: PPO | Attending: Cardiology | Admitting: Cardiology

## 2023-07-27 VITALS — Ht 65.0 in | Wt 222.0 lb

## 2023-07-27 DIAGNOSIS — R931 Abnormal findings on diagnostic imaging of heart and coronary circulation: Secondary | ICD-10-CM | POA: Diagnosis not present

## 2023-07-27 DIAGNOSIS — R0602 Shortness of breath: Secondary | ICD-10-CM

## 2023-07-27 DIAGNOSIS — I10 Essential (primary) hypertension: Secondary | ICD-10-CM | POA: Diagnosis not present

## 2023-07-27 DIAGNOSIS — I5032 Chronic diastolic (congestive) heart failure: Secondary | ICD-10-CM

## 2023-07-27 DIAGNOSIS — I471 Supraventricular tachycardia, unspecified: Secondary | ICD-10-CM

## 2023-07-27 DIAGNOSIS — E785 Hyperlipidemia, unspecified: Secondary | ICD-10-CM | POA: Diagnosis not present

## 2023-07-27 DIAGNOSIS — I2510 Atherosclerotic heart disease of native coronary artery without angina pectoris: Secondary | ICD-10-CM

## 2023-07-27 DIAGNOSIS — G4733 Obstructive sleep apnea (adult) (pediatric): Secondary | ICD-10-CM

## 2023-07-27 NOTE — Telephone Encounter (Signed)
I placed call to patient to schedule BMP, lipid clinic appointment and 6 month follow up with Dr Mayford Knife. Left message to call office

## 2023-07-27 NOTE — Telephone Encounter (Signed)
I spoke with patient and scheduled lipid clinic appointment for tomorrow at [facility] office.  Patient will have BMP drawn at that time.  Six month follow up appointment made for January 26, 2024.

## 2023-07-27 NOTE — Addendum Note (Signed)
Addended by: Dossie Arbour on: 07/27/2023 11:24 AM   Modules accepted: Orders

## 2023-07-27 NOTE — Patient Instructions (Signed)
Medication Instructions:  Your physician recommends that you continue on your current medications as directed. Please refer to the Current Medication list given to you today.  *If you need a refill on your cardiac medications before your next appointment, please call your pharmacy*   Lab Work: Your physician recommends that you return for lab work -BMP.  This is not fasting.   If you have labs (blood work) drawn today and your tests are completely normal, you will receive your results only by: MyChart Message (if you have MyChart) OR A paper copy in the mail If you have any lab test that is abnormal or we need to change your treatment, we will call you to review the results.   Testing/Procedures: Dr Mayford Knife recommends you have a Cardiac PET/CT test  How to Prepare for Your Cardiac PET/CT Stress Test:  1. Please do not take these medications before your test:   Medications that may interfere with the cardiac pharmacological stress agent (ex. nitrates - including erectile dysfunction medications, isosorbide mononitrate, tamulosin or beta-blockers) the day of the exam.  Theophylline containing medications for 12 hours. Dipyridamole 48 hours prior to the test. Your remaining medications may be taken with water.  2. Nothing to eat or drink, except water, 3 hours prior to arrival time.   NO caffeine/decaffeinated products, or chocolate 12 hours prior to arrival.  3. NO perfume, cologne or lotion on chest or abdomen area.          - FEMALES - Please avoid wearing dresses to this appointment.  4. Total time is 1 to 2 hours; you may want to bring reading material for the waiting time.  5. Please report to Radiology at the [facility] Main Entrance 30 minutes early for your test.  [address] [place], [place] [address]  6. Please report to Radiology at [facility] Main Entrance, medical mall, 30 mins prior to your test.  [address]  [place], [place]  [phone]  Diabetic Preparation:  Hold oral medications. You may take NPH and Lantus insulin. Do not take Humalog or Humulin R (Regular Insulin) the day of your test. Check blood sugars prior to leaving the house. If able to eat breakfast prior to 3 hour fasting, you may take all medications, including your insulin, Do not worry if you miss your breakfast dose of insulin - start at your next meal. Patients who wear a continuous glucose monitor MUST remove the device prior to scanning.  IF YOU THINK YOU MAY BE PREGNANT, OR ARE NURSING PLEASE INFORM THE TECHNOLOGIST.  In preparation for your appointment, medication and supplies will be purchased.  Appointment availability is limited, so if you need to cancel or reschedule, please call the Radiology Department at [phone] [facility]) OR [phone] [facility])  24 hours in advance to avoid a cancellation fee of $100.00  What to Expect After you Arrive:  Once you arrive and check in for your appointment, you will be taken to a preparation room within the Radiology Department.  A technologist or Nurse will obtain your medical history, verify that you are correctly prepped for the exam, and explain the procedure.  Afterwards,  an IV will be started in your arm and electrodes will be placed on your skin for EKG monitoring during the stress portion of the exam. Then you will be escorted to the PET/CT scanner.  There, staff will get you positioned on the scanner and obtain a blood pressure and  EKG.  During the exam, you will continue to be connected to the EKG and blood pressure machines.  A small, safe amount of a radioactive tracer will be injected in your IV to obtain a series of pictures of your heart along with an injection of a stress agent.    After your Exam:  It is recommended that you eat a meal and drink a caffeinated beverage to counter act any effects of the stress agent.  Drink plenty of fluids for the remainder  of the day and urinate frequently for the first couple of hours after the exam.  Your doctor will inform you of your test results within 7-10 business days.  For more information and frequently asked questions, please visit our website : [email or web address]  For questions about your test or how to prepare for your test, please call: Cardiac Imaging Nurse Navigators Office: [phone]    Follow-Up: At [facility], you and your health needs are our priority.  As part of our continuing mission to provide you with exceptional heart care, we have created designated Provider Care Teams.  These Care Teams include your primary Cardiologist (physician) and Advanced Practice Providers (APPs -  Physician Assistants and Nurse Practitioners) who all work together to provide you with the care you need, when you need it.  We recommend signing up for the patient portal called "MyChart".  Sign up information is provided on this After Visit Summary.  MyChart is used to connect with patients for Virtual Visits (Telemedicine).  Patients are able to view lab/test results, encounter notes, upcoming appointments, etc.  Non-urgent messages can be sent to your provider as well.   To learn more about what you can do with MyChart, go to [email or web address].    Your next appointment:   6 month(s)  Provider:   Armanda Magic, MD     Other Instructions  You have been referred to see the pharmacist in the lipid clinic

## 2023-07-28 ENCOUNTER — Ambulatory Visit: Payer: PPO | Attending: Cardiology | Admitting: Pharmacist Clinician (PhC)/ Clinical Pharmacy Specialist

## 2023-07-28 ENCOUNTER — Telehealth: Payer: Self-pay | Admitting: Pharmacist Clinician (PhC)/ Clinical Pharmacy Specialist

## 2023-07-28 ENCOUNTER — Other Ambulatory Visit (HOSPITAL_COMMUNITY): Payer: Self-pay

## 2023-07-28 ENCOUNTER — Telehealth: Payer: Self-pay | Admitting: Pharmacy Technician

## 2023-07-28 ENCOUNTER — Encounter: Payer: Self-pay | Admitting: Pharmacist Clinician (PhC)/ Clinical Pharmacy Specialist

## 2023-07-28 DIAGNOSIS — E785 Hyperlipidemia, unspecified: Secondary | ICD-10-CM | POA: Diagnosis not present

## 2023-07-28 DIAGNOSIS — I10 Essential (primary) hypertension: Secondary | ICD-10-CM | POA: Diagnosis not present

## 2023-07-28 DIAGNOSIS — I2510 Atherosclerotic heart disease of native coronary artery without angina pectoris: Secondary | ICD-10-CM

## 2023-07-28 DIAGNOSIS — R931 Abnormal findings on diagnostic imaging of heart and coronary circulation: Secondary | ICD-10-CM

## 2023-07-28 DIAGNOSIS — I5032 Chronic diastolic (congestive) heart failure: Secondary | ICD-10-CM | POA: Diagnosis not present

## 2023-07-28 MED ORDER — ROSUVASTATIN CALCIUM 5 MG PO TABS: ORAL_TABLET | ORAL | 3 refills | Status: DC

## 2023-07-28 NOTE — Assessment & Plan Note (Signed)
Assessment: Patient with ASCVD not at LDL goal of < 70 Most recent LDL 102 on Repatha Not able to tolerate statins secondary to myalgias Reviewed options for lowering LDL cholesterol, including ezetimibe, bempedoic acid and inclisiran.  Discussed mechanisms of action, dosing, side effects, potential decreases in LDL cholesterol and costs.  Also reviewed potential options for patient assistance.  Plan: Patient agreeable to starting rosuvastatin 5 mg twice weekly Repeat labs after:  2 months Lipid Liver function

## 2023-07-28 NOTE — Telephone Encounter (Signed)
Pharmacy Patient Advocate Encounter   Received notification from Pt Calls Messages that prior authorization for repatha is required/requested.   Insurance verification completed.   The patient is insured through [facility] ADVANTAGE/RX ADVANCE .   Per test claim: PA required; PA submitted to [facility] ADVANTAGE/RX ADVANCE via CoverMyMeds Key/confirmation #/EOC [id number] Status is pending

## 2023-07-28 NOTE — Progress Notes (Signed)
Office Visit    Patient Name: [name] Date of Encounter: 07/28/2023  Primary Care Provider:  Gaspar Garbe, MD Primary Cardiologist:  Armanda Magic, MD  Chief Complaint    Hyperlipidemia   Significant Past Medical History   CHF Chronic diastolic; on empagliflozin, furosemide  CAD CAC = 860 (97th percentile), majority in RCA (580), stress PET scheduled  HTN Controlled; on diltiazem  SVT Paroxysmal - on diltiazem 120 mg  hypothyroid Post surgical, on levothyroxine 224 mcg daily     Allergies  Allergen Reactions   Pravastatin Sodium Other (See Comments)    myalgias   Sulfa Antibiotics Hives   Sulfa Drugs Cross Reactors Hives   Venlafaxine     History of Present Illness    [name] is a 67 y.o. female patient of Dr Mayford Knife, in the office today to discuss options for cholesterol management.  She was seen by Laural Golden PharmD in March, at which time she started on Repatha 140 mg.  Follow up labs showed only about 1/3 drop in LDL (145 to 97).  This is about the same percentage drop that she got from pravastatin 40 mg daily.  Has not missed any doses of Repatha, nor issues with injection technique.  Her only concerns now with medication relate to cost.  She is able to get the Repatha from the [employer], but between that and [place], she is in the coverage gap, making the Jardiance more expensive.    Insurance Carrier: [employer]  LDL Cholesterol goal:  LDL < 70  Current Medications:  evolocumab 140 mg q14d   Previously tried:  pravastatin - myalgias  Family Hx:  on mother's side - grandfather and great grandfather both had CAD, grandfather died from MI at 69, mother had multiple strokes, starting at 20   Social Hx: Tobacco: quit 13-14 years ago    Diet:    watches fat and sodium, only eating out once or twice a month, no fast foods, occasional fried fish; lots of vegetables and fruit,   Exercise:  twice weekly  water aerobics (deep water) for 20 years    LDL hx 2024 1/24  3/24  5/23  6/24  9/24 95  145  132  97   102 Pravastatin None  Repatha Repatha Repatha  Accessory Clinical Findings   Lab Results  Component Value Date   CHOL 177 07/16/2023   HDL 60 07/16/2023   LDLCALC 102 (H) 07/16/2023   TRIG 80 07/16/2023   CHOLHDL 3.0 07/16/2023    No results found for: "LIPOA"  Lab Results  Component Value Date   ALT 16 03/13/2015   AST 20 03/13/2015   ALKPHOS 69 03/13/2015   BILITOT 0.50 03/13/2015   Lab Results  Component Value Date   CREATININE 0.84 12/18/2022   BUN 13 12/18/2022   NA 141 12/18/2022   K 4.2 12/18/2022   CL 103 12/18/2022   CO2 26 12/18/2022   No results found for: "HGBA1C"  Home Medications    Current Outpatient Medications  Medication Sig Dispense Refill   rosuvastatin (CRESTOR) 5 MG tablet Take 1 tablet by mouth twice weekly 24 tablet 3   alendronate (FOSAMAX) 70 MG tablet Take 70 mg by mouth once a week.     ALPRAZolam (XANAX) 1 MG tablet Take 1 mg by mouth at bedtime as needed for sleep.     aspirin EC 81 MG tablet Take 81 mg by mouth  every other day. Sundays and Thursdays     betamethasone dipropionate 0.05 % cream Apply topically.     CRANBERRY PO Take 500 mg by mouth daily.     diltiazem (CARDIZEM CD) 120 MG 24 hr capsule Take 1 capsule by mouth once daily in the morning 90 capsule 3   empagliflozin (JARDIANCE) 10 MG TABS tablet TAKE 1 TABLET BY MOUTH ONCE DAILY IN THE MORNING 90 tablet 1   Evolocumab (REPATHA SURECLICK) 140 MG/ML SOAJ Inject 140 mg into the skin every 14 (fourteen) days. 6 mL 3   fluticasone (CUTIVATE) 0.05 % cream Apply topically 2 (two) times daily as needed.     furosemide (LASIX) 20 MG tablet Take 1 tablet by mouth daily 90 tablet 3   hydrOXYzine (VISTARIL) 25 MG capsule Take 1 capsule by mouth daily as needed. itching     Multiple Vitamin (MULTIVITAMIN ADULT PO) Take 1 Dose by mouth daily. 2 gummies daily     Omega 3 1000 MG CAPS  Take 2,000 mg by mouth daily.     pantoprazole (PROTONIX) 40 MG tablet Take 1 tablet (40 mg total) daily by mouth. 90 tablet 3   sertraline (ZOLOFT) 100 MG tablet Take 150 mg by mouth daily.     SYNTHROID 112 MCG tablet Take 224 mcg by mouth every morning.     No current facility-administered medications for this visit.     Assessment & Plan    Hyperlipidemia LDL goal <70 Assessment: Patient with ASCVD not at LDL goal of < 70 Most recent LDL 102 on Repatha Not able to tolerate statins secondary to myalgias Reviewed options for lowering LDL cholesterol, including ezetimibe, bempedoic acid and inclisiran.  Discussed mechanisms of action, dosing, side effects, potential decreases in LDL cholesterol and costs.  Also reviewed potential options for patient assistance.  Plan: Patient agreeable to starting rosuvastatin 5 mg twice weekly Repeat labs after:  2 months Lipid Liver function   Phillips Hay, PharmD CPP [facility] [address]  [place], [place] [address] [phone]  07/28/2023, 11:34 AM

## 2023-07-28 NOTE — Telephone Encounter (Signed)
Please do Repatha PA renewal.  She is continuing, has had about 33% drop in LDL since starting.

## 2023-07-28 NOTE — Patient Instructions (Addendum)
Your Results:             Your most recent labs Goal  Total Cholesterol 177 < 200  Triglycerides 80 < 150  HDL (happy/good cholesterol) 60 > 40  LDL (lousy/bad cholesterol 102 < 70   Medication changes:  Start rosuvastatin 5 mg twice weekly (Monday and Friday)  Continue with Repatha 140 mg every 14 days  Lab orders:  We want to repeat labs after 2 months.  I will reach out to you via MyChart to get that organized   Thank you for choosing [facility]

## 2023-07-29 ENCOUNTER — Other Ambulatory Visit (HOSPITAL_COMMUNITY): Payer: Self-pay

## 2023-07-29 LAB — BASIC METABOLIC PANEL
BUN/Creatinine Ratio: 19 (ref 12–28)
BUN: 15 mg/dL (ref 8–27)
CO2: 28 mmol/L (ref 20–29)
Calcium: 9.7 mg/dL (ref 8.7–10.3)
Chloride: 103 mmol/L (ref 96–106)
Creatinine, Ser: 0.79 mg/dL (ref 0.57–1.00)
Glucose: 84 mg/dL (ref 70–99)
Potassium: 4.4 mmol/L (ref 3.5–5.2)
Sodium: 142 mmol/L (ref 134–144)
eGFR: 81 mL/min/{1.73_m2} (ref >59)

## 2023-07-30 ENCOUNTER — Other Ambulatory Visit (HOSPITAL_COMMUNITY): Payer: Self-pay

## 2023-07-30 MED ORDER — REPATHA SURECLICK 140 MG/ML ~~LOC~~ SOAJ: 1.0000 mL | SUBCUTANEOUS | 3 refills | Status: DC

## 2023-07-30 NOTE — Telephone Encounter (Signed)
See other encounter.

## 2023-07-30 NOTE — Telephone Encounter (Signed)
Pharmacy Patient Advocate Encounter  Received notification from [facility] ADVANTAGE/RX ADVANCE that Prior Authorization for repatha has been APPROVED from 07/29/23 to 07/28/24   PA #/Case ID/Reference #: [id number]

## 2023-07-30 NOTE — Addendum Note (Signed)
Addended by: Rosalee Kaufman on: 07/30/2023 01:26 PM   Modules accepted: Orders

## 2023-08-03 ENCOUNTER — Telehealth: Payer: Self-pay

## 2023-08-03 DIAGNOSIS — E785 Hyperlipidemia, unspecified: Secondary | ICD-10-CM

## 2023-08-03 DIAGNOSIS — Z79899 Other long term (current) drug therapy: Secondary | ICD-10-CM

## 2023-08-03 NOTE — Addendum Note (Signed)
Addended by: Quintella Reichert on: 08/03/2023 09:18 AM   Modules accepted: Orders

## 2023-08-03 NOTE — Telephone Encounter (Signed)
-----   Message from Armanda Magic sent at 07/26/2023 12:00 PM EDT ----- LDL not at goal on Repatha - please get back into lipid clinic with PharmD ----- Message ----- From: Trinda Pascal Sent: 07/26/2023  11:26 AM EDT To: Quintella Reichert, MD

## 2023-08-03 NOTE — Telephone Encounter (Signed)
Called patient to discuss LDL, which is not at goal on Repatha. No answer, left detailed message per DPR explaining that Dr. Mayford Knife would like her to  get back into lipid clinic with PharmD  to discuss medication regimen to get LDL to goal. Forwarded to Pharm D.

## 2023-08-04 NOTE — Telephone Encounter (Signed)
Labs were checked because they were required for Repatha renewal. Pt just started on rosuvastatin when she saw lipid clinic last week, doesn't need another appt.

## 2023-08-04 NOTE — Telephone Encounter (Signed)
Pt returning your call

## 2023-08-05 NOTE — Addendum Note (Signed)
Addended by: Rexene Edison L on: 08/05/2023 11:59 AM   Modules accepted: Orders

## 2023-08-05 NOTE — Telephone Encounter (Signed)
Call to patient to explain that Dr. Mayford Knife spoke with pharmacy, advises to continue on repatha and rosuvastatin, then retest in 6 weeks. FLP /ALT ordered and scheduled for 09/15/23.

## 2023-08-13 ENCOUNTER — Ambulatory Visit: Payer: PPO | Admitting: Cardiology

## 2023-08-17 ENCOUNTER — Encounter (HOSPITAL_COMMUNITY): Payer: Self-pay

## 2023-08-18 ENCOUNTER — Encounter (HOSPITAL_COMMUNITY)
Admission: RE | Admit: 2023-08-18 | Discharge: 2023-08-18 | Disposition: A | Discharge status: Home / Self Care | Payer: PPO | Source: Ambulatory Visit | Attending: Cardiology | Admitting: Cardiology

## 2023-08-18 DIAGNOSIS — R0602 Shortness of breath: Secondary | ICD-10-CM | POA: Insufficient documentation

## 2023-08-18 DIAGNOSIS — R931 Abnormal findings on diagnostic imaging of heart and coronary circulation: Secondary | ICD-10-CM | POA: Diagnosis not present

## 2023-08-18 LAB — NM PET CT CARDIAC PERFUSION MULTI W/ABSOLUTE BLOODFLOW
LV dias vol: 109 mL (ref 46–106)
MBFR: 1.89
Nuc Rest EF: 62 %
Nuc Stress EF: 75 %
Peak HR: 87 {beats}/min
Rest HR: 68 {beats}/min
Rest MBF: 1.26 ml/g/min
Rest Nuclear Isotope Dose: 26 mCi
Rest perfusion cavity size (mL): 109 mL
ST Depression (mm): 0 mm
Stress MBF: 2.38 ml/g/min
Stress Nuclear Isotope Dose: 26 mCi
Stress perfusion cavity size (mL): 120 mL
TID: 0.95

## 2023-08-18 MED ORDER — RUBIDIUM RB82 GENERATOR (RUBYFILL)
26.0500 | PACK | Freq: Once | INTRAVENOUS | Status: AC
  Administered 2023-08-18: 26.05 via INTRAVENOUS

## 2023-08-18 MED ORDER — REGADENOSON 0.4 MG/5ML IV SOLN
0.4000 mg | Freq: Once | INTRAVENOUS | Status: AC
  Administered 2023-08-18: 0.4 mg via INTRAVENOUS

## 2023-08-18 MED ORDER — RUBIDIUM RB82 GENERATOR (RUBYFILL)
26.0100 | PACK | Freq: Once | INTRAVENOUS | Status: AC
  Administered 2023-08-18: 26.01 via INTRAVENOUS

## 2023-08-18 MED ORDER — REGADENOSON 0.4 MG/5ML IV SOLN
INTRAVENOUS | Status: AC
  Filled 2023-08-18: qty 5

## 2023-08-19 ENCOUNTER — Encounter: Payer: Self-pay | Admitting: Cardiology

## 2023-08-19 ENCOUNTER — Telehealth: Payer: Self-pay

## 2023-08-19 DIAGNOSIS — G4733 Obstructive sleep apnea (adult) (pediatric): Secondary | ICD-10-CM | POA: Diagnosis not present

## 2023-08-19 DIAGNOSIS — I700 Atherosclerosis of aorta: Secondary | ICD-10-CM | POA: Insufficient documentation

## 2023-08-19 NOTE — Telephone Encounter (Signed)
-----   Message from Armanda Magic sent at 08/19/2023 11:12 AM EDT ----- Please let patient know that stress test was fine

## 2023-08-19 NOTE — Telephone Encounter (Signed)
Call to patient to discuss stress test results. No answer, left detailed message per DPR that stress test was "fine" per Dr. Mayford Knife. Small hiatal hernia on imaging, advised patient to follow up  with PCP or call our office for more info if any questions.

## 2023-08-21 DIAGNOSIS — G4733 Obstructive sleep apnea (adult) (pediatric): Secondary | ICD-10-CM | POA: Diagnosis not present

## 2023-08-23 ENCOUNTER — Telehealth: Payer: Self-pay | Admitting: Pharmacist Clinician (PhC)/ Clinical Pharmacy Specialist

## 2023-08-23 MED ORDER — EZETIMIBE 10 MG PO TABS: 10.0000 mg | ORAL_TABLET | Freq: Every day | ORAL | 11 refills | Status: DC

## 2023-08-23 NOTE — Telephone Encounter (Signed)
Pt seen by Belenda Cruise on 9/25 as LDL remained elevated on Repatha therapy. Previously intolerant to pravastatin, now intolerant to low dose rosuvastatin 2x a week. Called pt who reports cramping was waking her up by the 3rd dose, had been taking on Mondays and Fridays. Advised pt to stop rosuvastatin, monitor for symptom improvement, and then start on ezetimibe 10mg  daily in the next week or so. Nov lab appt moved back to the first week of December. She is aware to continue on Repatha. Med/allergy list updated.

## 2023-08-23 NOTE — Telephone Encounter (Signed)
Pt c/o medication issue:  1. Name of Medication:   rosuvastatin (CRESTOR) 5 MG tablet    2. How are you currently taking this medication (dosage and times per day)? As written   3. Are you having a reaction (difficulty breathing--STAT)? No   4. What is your medication issue? Pt called in stating she has been having cramps in her legs and she is unable to take this medication. Please advise.

## 2023-08-24 ENCOUNTER — Ambulatory Visit: Payer: PPO

## 2023-09-08 ENCOUNTER — Ambulatory Visit: Payer: PPO | Admitting: Orthopedic Surgery

## 2023-09-10 ENCOUNTER — Telehealth: Payer: Self-pay | Admitting: Pharmacist Clinician (PhC)/ Clinical Pharmacy Specialist

## 2023-09-10 NOTE — Telephone Encounter (Signed)
Patient calling the office for samples of medication:  1.  What medication and dosage are you requesting samples for?   empagliflozin (JARDIANCE) 10 MG TABS tablet   2.  Are you currently out of this medication?   Patient stated she has been trying out this medication and is now out of medication.

## 2023-09-13 MED ORDER — EMPAGLIFLOZIN 10 MG PO TABS: 10.0000 mg | ORAL_TABLET | Freq: Every day | ORAL | Status: DC

## 2023-09-13 NOTE — Telephone Encounter (Signed)
Ok for patient to have Jardiance 10mg  samples. Recommend patient assistance application as well.

## 2023-09-13 NOTE — Telephone Encounter (Signed)
Patient called again to get samples of Jardiance.

## 2023-09-13 NOTE — Telephone Encounter (Signed)
Call to patient to advise 2 weeks of Samples prepared for patient and waiting at the front desk. Advised patient that she needs to start a patient assistance application in order to get more samples, patient states she was not aware of this and was under the impression that she could call and get samples whenever she needed them. Patient agrees to pick up samples but requests call back from Pharmacist Belenda Cruise to discuss a long-term plan for access to jardiance.

## 2023-09-15 ENCOUNTER — Ambulatory Visit
Admission: RE | Admit: 2023-09-15 | Discharge: 2023-09-15 | Disposition: A | Discharge status: Home / Self Care | Payer: PPO | Source: Ambulatory Visit | Attending: Internal Medicine | Admitting: Internal Medicine

## 2023-09-15 ENCOUNTER — Other Ambulatory Visit: Payer: PPO

## 2023-09-15 DIAGNOSIS — Z1231 Encounter for screening mammogram for malignant neoplasm of breast: Secondary | ICD-10-CM

## 2023-09-15 NOTE — Telephone Encounter (Signed)
Spoke with patient - will get samples of Jardiance (office is out at the moment) and she will get a 3 month supply through her insurance in December to help avoid hitting coverage gap in 2025.

## 2023-09-21 DIAGNOSIS — G4733 Obstructive sleep apnea (adult) (pediatric): Secondary | ICD-10-CM | POA: Diagnosis not present

## 2023-09-24 ENCOUNTER — Encounter: Payer: Self-pay | Admitting: Pharmacist

## 2023-09-24 NOTE — Telephone Encounter (Signed)
This encounter was created in error - please disregard.

## 2023-09-27 ENCOUNTER — Ambulatory Visit: Payer: PPO | Admitting: Orthopedic Surgery

## 2023-10-04 ENCOUNTER — Ambulatory Visit: Payer: PPO | Attending: Cardiology

## 2023-10-04 DIAGNOSIS — Z7689 Persons encountering health services in other specified circumstances: Secondary | ICD-10-CM | POA: Diagnosis not present

## 2023-10-04 DIAGNOSIS — E785 Hyperlipidemia, unspecified: Secondary | ICD-10-CM

## 2023-10-04 DIAGNOSIS — Z79899 Other long term (current) drug therapy: Secondary | ICD-10-CM

## 2023-10-05 LAB — LIPID PANEL
Chol/HDL Ratio: 2.3 {ratio} (ref 0.0–4.4)
Cholesterol, Total: 154 mg/dL (ref 100–199)
HDL: 68 mg/dL (ref >39)
LDL Chol Calc (NIH): 72 mg/dL (ref 0–99)
Triglycerides: 74 mg/dL (ref 0–149)
VLDL Cholesterol Cal: 14 mg/dL (ref 5–40)

## 2023-10-05 LAB — ALT: ALT: 15 [IU]/L (ref 0–32)

## 2023-10-08 ENCOUNTER — Telehealth: Payer: Self-pay

## 2023-10-08 DIAGNOSIS — E039 Hypothyroidism, unspecified: Secondary | ICD-10-CM | POA: Diagnosis not present

## 2023-10-08 DIAGNOSIS — I2584 Coronary atherosclerosis due to calcified coronary lesion: Secondary | ICD-10-CM | POA: Diagnosis not present

## 2023-10-08 DIAGNOSIS — E7800 Pure hypercholesterolemia, unspecified: Secondary | ICD-10-CM | POA: Diagnosis not present

## 2023-10-08 DIAGNOSIS — I471 Supraventricular tachycardia, unspecified: Secondary | ICD-10-CM | POA: Diagnosis not present

## 2023-10-08 DIAGNOSIS — F419 Anxiety disorder, unspecified: Secondary | ICD-10-CM | POA: Diagnosis not present

## 2023-10-08 DIAGNOSIS — I83813 Varicose veins of bilateral lower extremities with pain: Secondary | ICD-10-CM | POA: Diagnosis not present

## 2023-10-08 DIAGNOSIS — K219 Gastro-esophageal reflux disease without esophagitis: Secondary | ICD-10-CM | POA: Diagnosis not present

## 2023-10-08 DIAGNOSIS — M810 Age-related osteoporosis without current pathological fracture: Secondary | ICD-10-CM | POA: Diagnosis not present

## 2023-10-08 DIAGNOSIS — I2510 Atherosclerotic heart disease of native coronary artery without angina pectoris: Secondary | ICD-10-CM | POA: Diagnosis not present

## 2023-10-08 DIAGNOSIS — J309 Allergic rhinitis, unspecified: Secondary | ICD-10-CM | POA: Diagnosis not present

## 2023-10-08 DIAGNOSIS — C50912 Malignant neoplasm of unspecified site of left female breast: Secondary | ICD-10-CM | POA: Diagnosis not present

## 2023-10-08 NOTE — Telephone Encounter (Signed)
-----   Message from Christell Constant sent at 10/06/2023  8:40 AM EST ----- LDL is near goal on highest tolerated therapy.  No further changes at this time ----- Message ----- From: Interface, [facility] Lab Results In Sent: 10/05/2023   4:31 PM EST To: Quintella Reichert, MD

## 2023-10-08 NOTE — Telephone Encounter (Signed)
Call to patient to advise LDL is near goal on highest tolerated therapy. Patient verbalizes understanding of no further changes at this time

## 2023-10-21 ENCOUNTER — Other Ambulatory Visit (INDEPENDENT_AMBULATORY_CARE_PROVIDER_SITE_OTHER): Payer: PPO

## 2023-10-21 ENCOUNTER — Encounter: Payer: Self-pay | Admitting: Orthopedic Surgery

## 2023-10-21 ENCOUNTER — Ambulatory Visit: Payer: PPO | Admitting: Orthopedic Surgery

## 2023-10-21 DIAGNOSIS — M25572 Pain in left ankle and joints of left foot: Secondary | ICD-10-CM

## 2023-10-21 DIAGNOSIS — G4733 Obstructive sleep apnea (adult) (pediatric): Secondary | ICD-10-CM | POA: Diagnosis not present

## 2023-10-21 NOTE — Progress Notes (Signed)
Office Visit Note   Patient: [name]           Date of Birth: 09/02/1956           MRN: [id number] Visit Date: 10/21/2023 Requested by: Gaspar Garbe, MD [address] [place],  [place] [address] PCP: Wylene Simmer Adelfa Koh, MD  Subjective: Chief Complaint  Patient presents with   Other     Left foot/ankle pain    HPI: Briella Warne Der Gilford Rile is a 67 y.o. female who presents to the office reporting left foot and ankle pain for 6 weeks.  Denies any history of injury.  Patient states the pain comes and goes.  Really localizes it to the lateral sinus Tarsi region.  She reports occasional numbness in her foot toes.  Does have a history of severe left ankle sprain in the remote past.  Has had prior Doppler studies which showed good blood flow.  Swelling comes and goes.  She states water exercise helps the pain.  Uses Voltaren cream for her other joint aches..                ROS: All systems reviewed are negative as they relate to the chief complaint within the history of present illness.  Patient denies fevers or chills.  Assessment & Plan: Visit Diagnoses:  1. Pain in left ankle and joints of left foot     Plan: Impression is lateral sided ankle pain with differential diagnosis including tendinitis of the peroneal tendons versus mild posterior tib tendon deficiency giving lateral ankle impingement versus occult stress reaction or fracture.  Radiographs unremarkable.  Encouraged her to use shoes that have a high arch or least moderate arch in order to correct her sagging midfoot deformity.  This could help some of the impingement pain on the lateral side.  If that fails to alleviate her symptoms along with continued conservative treatment including topical Voltaren then MRI scanning of the ankle is indicated.  We would pursue that in January if she calls and let us know that her symptoms are not improving.  Follow-Up Instructions: No follow-ups on file.   Orders:   Orders Placed This Encounter  Procedures   XR Foot Complete Left   XR Ankle Complete Left   No orders of the defined types were placed in this encounter.     Procedures: No procedures performed   Clinical Data: No additional findings.  Objective: Vital Signs: There were no vitals taken for this visit.  Physical Exam:  Constitutional: Patient appears well-developed HEENT:  Head: Normocephalic Eyes:EOM are normal Neck: Normal range of motion Cardiovascular: Normal rate Pulmonary/chest: Effort normal Neurologic: Patient is alert Skin: Skin is warm Psychiatric: Patient has normal mood and affect  Ortho Exam: Ortho exam demonstrates that her heel inversion while standing on her toes on the right side than the left.  She is able to stand on the toes of both feet.  Palpable intact nontender anterior tib posterior tib and Achilles tendons.  She has a little bit of tenderness on the peroneal tendons on the left.  Tibiotalar subtalar transverse tarsal range of motion intact and nontender and fully symmetric with the right-hand side.  Pedal pulses palpable.  No calf tenderness negative Homans.  Ankle is actually stable to anterior drawer and varus tilt testing right versus left.  Specialty Comments:  MRI LUMBAR SPINE WITHOUT CONTRAST   TECHNIQUE: Multiplanar, multisequence MR imaging of the lumbar spine was performed. No intravenous contrast  was administered.   COMPARISON:  Radiographs dated December 31, 2021   FINDINGS: Segmentation:  Standard.   Alignment: Mild acute kyphotic angulation at T11-T12 due to compression fracture of T12. Mild anterolisthesis of L4.   Vertebrae: Compression deformity of T12 vertebral body with approximately 50% vertebral body height loss, without evidence of edema likely a chronic process.   Conus medullaris and cauda equina: Conus extends to the L1-L2 level. Conus and cauda equina appear normal.   Paraspinal and other soft tissues: Nonspecific  subcutaneous soft tissue edema.   Disc spaces:   T11-T12: Disc height loss with disc protrusion into the central canal. Mild central canal narrowing. No significant neural foraminal narrowing. Mild facet joint arthropathy.   T12-L1: No significant disc bulge. No neural foraminal stenosis. No central canal stenosis.   L1-L2: No significant disc bulge. No neural foraminal stenosis. No central canal stenosis.   L2-L3: No significant disc bulge. No neural foraminal stenosis. No central canal stenosis. Bilateral facet joint arthropathy, left worse than the right.   L3-L4: Mild disc protrusion with mild narrowing of lateral recesses bilaterally. Mild ligamentum flavum hypertrophy. Moderate facet joint arthropathy. No significant neural foraminal stenosis.   L4-L5: Moderate circumferential disc protrusion and ligamentum flavum hypertrophy. Mild narrowing of lateral recesses bilaterally. Moderate facet joint arthropathy. No significant neural foraminal stenosis.   L5-S1: Disc height loss and broad-base disc protrusion. Narrowing of bilateral lateral recesses. Moderate facet joint hypertrophy. Mild bilateral neural foraminal stenosis, right worse than the left.   IMPRESSION: 1. Compression deformity of T12 vertebral body with approximately 50% vertebral body height loss, likely a chronic process.   2. Multilevel degenerate disc disease most prominent at L4-L5 and L5-S1 with mild bilateral neural foraminal stenosis at L5-S1.     Electronically Signed   By: Larose Hires D.O.   On: 01/05/2022 09:00  Imaging: XR Ankle Complete Left Result Date: 10/21/2023 AP lateral mortise radiographs reviewed.  No acute fracture.  Mortise is symmetric.  Talar dome intact.  Minimal degenerative changes present in the tibiotalar joint.  Mild pes planus is present.  XR Foot Complete Left Result Date: 10/21/2023 AP lateral oblique radiographs left foot reviewed.  No acute fracture.   Tarsometatarsal alignment intact.  No significant arthritis present in the midfoot or in the tarsometatarsal joints.    PMFS History: Patient Active Problem List   Diagnosis Date Noted   Aortic atherosclerosis (HCC)    Hyperlipidemia LDL goal <70 07/28/2023   Statin myopathy 01/19/2023   Coronary artery disease involving native heart without angina pectoris 01/19/2023   Agatston coronary artery calcium score greater than 400 01/19/2023   BPPV (benign paroxysmal positional vertigo), right 07/11/2021   Fibroma of intraoral region 01/14/2021   Otalgia, left ear 01/14/2021   Temporomandibular joint dysfunction 01/14/2021   Malocclusion 09/02/2020   Anxiety 11/15/2018   Insomnia 11/15/2018   Chronic diastolic heart failure (HCC) 04/18/2018   Benign essential HTN 04/18/2018   Congestive heart failure (HCC) 03/23/2018   Mild dilation of ascending aorta (HCC)    Elevated blood pressure reading without diagnosis of hypertension    Abnormal EKG 02/07/2016   Edema of extremities 02/07/2016   Joint pain 03/19/2014   Paroxysmal supraventricular tachycardia (HCC) 12/22/2013   Breast cancer of upper-outer quadrant of left female breast (HCC) 09/19/2013   Vaginal atrophy 08/18/2013   Vaginitis and vulvovaginitis 08/18/2013   Hypothyroidism, postsurgical 04/03/2013   Past Medical History:  Diagnosis Date   Agatston coronary artery calcium score greater than  400    Ca score 860 with normal Stress PET CT 08/2023   Anxiety    Aortic atherosclerosis (HCC)    Arthritis    lower back and thumbs   Breast cancer of upper-outer quadrant of left female breast (HCC) 09/19/2013   Cancer (HCC)    left breast-(12-12-09 radiation and surgery)-Dr. Darnelle Catalan -oncology   Chronic diastolic CHF (congestive heart failure) (HCC)    Colon polyps    sm aden p 10/06 (difficult exam, needs propofol);neg 07/2010   Dyslipidemia    Edema extremities 02/07/2016   GERD (gastroesophageal reflux disease)    Barrett's  [facility]), neg for metaplasia '97, '08 (mod HH) 9.2011   HTN (hypertension)    Hypothyroidism, postsurgical 04/03/2013   Laryngitis 12/20/2012   recent episode is resolving with Amoxicillin orally   Mild dilation of ascending aorta (HCC)    Obesity    Osteoporosis    Paroxysmal supraventricular tachycardia (HCC) 12/22/2013   Personal history of radiation therapy    2011   PONV (postoperative nausea and vomiting)    Thyroid adenoma 12/20/2012   surgery planned 12-30-12   Vaginal atrophy 08/18/2013   Vaginitis and vulvovaginitis 08/18/2013    Family History  Problem Relation Age of Onset   Stroke Mother    Heart disease Father    Cancer Brother        melanoma    Past Surgical History:  Procedure Laterality Date   BREAST LUMPECTOMY Left    2011   BREAST SURGERY     left breast lumpectomy   EYE SURGERY  12-20-12   right eye retina tear repair   THYROIDECTOMY N/A 12/30/2012   Procedure: THYROIDECTOMY;  Surgeon: Velora Heckler, MD;  Location: [facility];  Service: General;  Laterality: N/A;   Social History   Occupational History   Not on file  Tobacco Use   Smoking status: Former    Current packs/day: 0.00    Types: Cigarettes    Quit date: 01/31/2010    Years since quitting: 13.7   Smokeless tobacco: Never  Vaping Use   Vaping status: Never Used  Substance and Sexual Activity   Alcohol use: Yes    Comment: social- occ   Drug use: No   Sexual activity: Yes    Birth control/protection: Post-menopausal

## 2023-10-25 ENCOUNTER — Telehealth: Payer: Self-pay | Admitting: Cardiology

## 2023-10-25 MED ORDER — EMPAGLIFLOZIN 10 MG PO TABS: 10.0000 mg | ORAL_TABLET | Freq: Every day | ORAL | Status: DC

## 2023-10-25 NOTE — Telephone Encounter (Signed)
Patient returned RN's call and was notified samples are at the front desk.  Patient stated she will collect to samples on Friday, 12/27.

## 2023-10-25 NOTE — Telephone Encounter (Signed)
Patient calling the office for samples of medication:   1.  What medication and dosage are you requesting samples for? empagliflozin (JARDIANCE) 10 MG TABS tablet  2.  Are you currently out of this medication? yes

## 2023-10-25 NOTE — Telephone Encounter (Signed)
Left voicemail for patient to return call to office.  Front desk if patient calls back please let her know that samples has been placed at the front desk.

## 2023-11-21 DIAGNOSIS — G4733 Obstructive sleep apnea (adult) (pediatric): Secondary | ICD-10-CM | POA: Diagnosis not present

## 2023-11-24 DIAGNOSIS — G4733 Obstructive sleep apnea (adult) (pediatric): Secondary | ICD-10-CM | POA: Diagnosis not present

## 2023-12-22 DIAGNOSIS — G4733 Obstructive sleep apnea (adult) (pediatric): Secondary | ICD-10-CM | POA: Diagnosis not present

## 2023-12-24 DIAGNOSIS — G4733 Obstructive sleep apnea (adult) (pediatric): Secondary | ICD-10-CM | POA: Diagnosis not present

## 2024-01-03 ENCOUNTER — Ambulatory Visit: Payer: PPO | Admitting: Orthopedic Surgery

## 2024-01-03 DIAGNOSIS — M24272 Disorder of ligament, left ankle: Secondary | ICD-10-CM

## 2024-01-04 ENCOUNTER — Encounter: Payer: Self-pay | Admitting: Orthopedic Surgery

## 2024-01-04 NOTE — Progress Notes (Signed)
 Office Visit Note   Patient: [name]           Date of Birth: Dec 04, 1955           MRN: [id number] Visit Date: 01/03/2024              Requested by: Gaspar Garbe, MD [address] [place],  [place] [address] PCP: Wylene Simmer, Adelfa Koh, MD  Chief Complaint  Patient presents with   Left Foot - Pain      HPI: Patient is a 68 year old woman who is seen for left ankle pain.  Patient states she has had a history of severe left ankle sprains.  Patient is currently using shoes with arch support using Voltaren gel.  Patient has also been doing water therapy exercising.  Patient states that she sprained her ankle in 1999.  Assessment & Plan: Visit Diagnoses:  1. Ankle ligament laxity, left     Plan: Recommended knee-high compression socks for the venous insufficiency.  The callus was pared beneath the second metatarsal head.  Recommended formalized physical therapy and topical Voltaren gel.  Discussed that we could try an ASO to help with support she states she would like to hold on this at this time.  Follow-Up Instructions: Return if symptoms worsen or fail to improve.   Ortho Exam  Patient is alert, oriented, no adenopathy, well-dressed, normal affect, normal respiratory effort. Examination patient has a good dorsalis pedis pulse she states the foot pain on the left is intermittent.  She has venous stasis swelling but no ulcers.  Patient has a high arch with increased pressure beneath the second metatarsal head with callus.  The callus was pared there is no open ulcer or wound.  Patient is tender to palpation over the anterior talofibular ligament.  Anterior drawer is stable.  There is no pain to palpation over the posterior tibial tendon or peroneal tendons.  Imaging: No results found. No images are attached to the encounter.  Labs: No results found for: "HGBA1C", "ESRSEDRATE", "CRP", "LABURIC", "REPTSTATUS", "GRAMSTAIN", "CULT", "LABORGA"   Lab Results   Component Value Date   ALBUMIN 3.6 03/13/2015   ALBUMIN 3.7 09/13/2014   ALBUMIN 3.9 04/25/2014    No results found for: "MG" Lab Results  Component Value Date   VD25OH 43 09/22/2011   VD25OH 40 12/23/2010    No results found for: "PREALBUMIN"    Latest Ref Rng & Units 05/29/2021    1:55 PM 09/10/2017    3:12 PM 03/13/2015   12:03 PM  CBC EXTENDED  WBC 4.0 - 10.5 K/uL 4.4  6.2  5.8   RBC 3.87 - 5.11 MIL/uL 4.42  4.20  4.18   Hemoglobin 12.0 - 15.0 g/dL 19.1  47.8  29.5   HCT 36.0 - 46.0 % 36.8  36.1  37.7   Platelets 150 - 400 K/uL 223  316  264   NEUT# 1.5 - 6.5 10e3/uL   3.7   Lymph# 0.9 - 3.3 10e3/uL   1.4      There is no height or weight on file to calculate BMI.  Orders:  Orders Placed This Encounter  Procedures   Ambulatory referral to Physical Therapy   No orders of the defined types were placed in this encounter.    Procedures: No procedures performed  Clinical Data: No additional findings.  ROS:  All other systems negative, except as noted in the HPI. Review of Systems  Objective: Vital Signs: There were no vitals  taken for this visit.  Specialty Comments:  MRI LUMBAR SPINE WITHOUT CONTRAST   TECHNIQUE: Multiplanar, multisequence MR imaging of the lumbar spine was performed. No intravenous contrast was administered.   COMPARISON:  Radiographs dated December 31, 2021   FINDINGS: Segmentation:  Standard.   Alignment: Mild acute kyphotic angulation at T11-T12 due to compression fracture of T12. Mild anterolisthesis of L4.   Vertebrae: Compression deformity of T12 vertebral body with approximately 50% vertebral body height loss, without evidence of edema likely a chronic process.   Conus medullaris and cauda equina: Conus extends to the L1-L2 level. Conus and cauda equina appear normal.   Paraspinal and other soft tissues: Nonspecific subcutaneous soft tissue edema.   Disc spaces:   T11-T12: Disc height loss with disc protrusion into  the central canal. Mild central canal narrowing. No significant neural foraminal narrowing. Mild facet joint arthropathy.   T12-L1: No significant disc bulge. No neural foraminal stenosis. No central canal stenosis.   L1-L2: No significant disc bulge. No neural foraminal stenosis. No central canal stenosis.   L2-L3: No significant disc bulge. No neural foraminal stenosis. No central canal stenosis. Bilateral facet joint arthropathy, left worse than the right.   L3-L4: Mild disc protrusion with mild narrowing of lateral recesses bilaterally. Mild ligamentum flavum hypertrophy. Moderate facet joint arthropathy. No significant neural foraminal stenosis.   L4-L5: Moderate circumferential disc protrusion and ligamentum flavum hypertrophy. Mild narrowing of lateral recesses bilaterally. Moderate facet joint arthropathy. No significant neural foraminal stenosis.   L5-S1: Disc height loss and broad-base disc protrusion. Narrowing of bilateral lateral recesses. Moderate facet joint hypertrophy. Mild bilateral neural foraminal stenosis, right worse than the left.   IMPRESSION: 1. Compression deformity of T12 vertebral body with approximately 50% vertebral body height loss, likely a chronic process.   2. Multilevel degenerate disc disease most prominent at L4-L5 and L5-S1 with mild bilateral neural foraminal stenosis at L5-S1.     Electronically Signed   By: Larose Hires D.O.   On: 01/05/2022 09:00  PMFS History: Patient Active Problem List   Diagnosis Date Noted   Aortic atherosclerosis (HCC)    Hyperlipidemia LDL goal <70 07/28/2023   Statin myopathy 01/19/2023   Coronary artery disease involving native heart without angina pectoris 01/19/2023   Agatston coronary artery calcium score greater than 400 01/19/2023   BPPV (benign paroxysmal positional vertigo), right 07/11/2021   Fibroma of intraoral region 01/14/2021   Otalgia, left ear 01/14/2021   Temporomandibular joint  dysfunction 01/14/2021   Malocclusion 09/02/2020   Anxiety 11/15/2018   Insomnia 11/15/2018   Chronic diastolic heart failure (HCC) 04/18/2018   Benign essential HTN 04/18/2018   Congestive heart failure (HCC) 03/23/2018   Mild dilation of ascending aorta (HCC)    Elevated blood pressure reading without diagnosis of hypertension    Abnormal EKG 02/07/2016   Edema of extremities 02/07/2016   Joint pain 03/19/2014   Paroxysmal supraventricular tachycardia (HCC) 12/22/2013   Breast cancer of upper-outer quadrant of left female breast (HCC) 09/19/2013   Vaginal atrophy 08/18/2013   Vaginitis and vulvovaginitis 08/18/2013   Hypothyroidism, postsurgical 04/03/2013   Past Medical History:  Diagnosis Date   Agatston coronary artery calcium score greater than 400    Ca score 860 with normal Stress PET CT 08/2023   Anxiety    Aortic atherosclerosis (HCC)    Arthritis    lower back and thumbs   Breast cancer of upper-outer quadrant of left female breast (HCC) 09/19/2013   Cancer (HCC)  left breast-(12-12-09 radiation and surgery)-Dr. Darnelle Catalan -oncology   Chronic diastolic CHF (congestive heart failure) (HCC)    Colon polyps    sm aden p 10/06 (difficult exam, needs propofol);neg 07/2010   Dyslipidemia    Edema extremities 02/07/2016   GERD (gastroesophageal reflux disease)    [facility]'s [facility]), neg for metaplasia '97, '08 (mod HH) 9.2011   HTN (hypertension)    Hypothyroidism, postsurgical 04/03/2013   Laryngitis 12/20/2012   recent episode is resolving with Amoxicillin orally   Mild dilation of ascending aorta (HCC)    Obesity    Osteoporosis    Paroxysmal supraventricular tachycardia (HCC) 12/22/2013   Personal history of radiation therapy    2011   PONV (postoperative nausea and vomiting)    Thyroid adenoma 12/20/2012   surgery planned 12-30-12   Vaginal atrophy 08/18/2013   Vaginitis and vulvovaginitis 08/18/2013    Family History  Problem Relation Age of Onset   Stroke  Mother    Heart disease Father    Cancer Brother        melanoma    Past Surgical History:  Procedure Laterality Date   BREAST LUMPECTOMY Left    2011   BREAST SURGERY     left breast lumpectomy   EYE SURGERY  12-20-12   right eye retina tear repair   THYROIDECTOMY N/A 12/30/2012   Procedure: THYROIDECTOMY;  Surgeon: Velora Heckler, MD;  Location: [facility];  Service: General;  Laterality: N/A;   Social History   Occupational History   Not on file  Tobacco Use   Smoking status: Former    Current packs/day: 0.00    Types: Cigarettes    Quit date: 01/31/2010    Years since quitting: 13.9   Smokeless tobacco: Never  Vaping Use   Vaping status: Never Used  Substance and Sexual Activity   Alcohol use: Yes    Comment: social- occ   Drug use: No   Sexual activity: Yes    Birth control/protection: Post-menopausal

## 2024-01-18 ENCOUNTER — Encounter: Payer: Self-pay | Admitting: Physical Therapy

## 2024-01-18 ENCOUNTER — Ambulatory Visit: Admitting: Physical Therapy

## 2024-01-18 DIAGNOSIS — R2689 Other abnormalities of gait and mobility: Secondary | ICD-10-CM | POA: Diagnosis not present

## 2024-01-18 DIAGNOSIS — M25572 Pain in left ankle and joints of left foot: Secondary | ICD-10-CM

## 2024-01-18 DIAGNOSIS — M6281 Muscle weakness (generalized): Secondary | ICD-10-CM | POA: Diagnosis not present

## 2024-01-18 NOTE — Therapy (Signed)
 OUTPATIENT PHYSICAL THERAPY LOWER EXTREMITY EVALUATION   Patient Name: [name] MRN: [id number] DOB:Aug 27, 1956, 68 y.o., female Today's Date: 01/18/2024  END OF SESSION:  PT End of Session - 01/18/24 1218     Visit Number 1    Number of Visits 8    Date for PT Re-Evaluation 03/14/24    Progress Note Due on Visit 10    PT Start Time 1100    PT Stop Time 1149    PT Time Calculation (min) 49 min    Activity Tolerance Patient tolerated treatment well    Behavior During Therapy [facility] for tasks assessed/performed             Past Medical History:  Diagnosis Date   Agatston coronary artery calcium score greater than 400    Ca score 860 with normal Stress PET CT 08/2023   Anxiety    Aortic atherosclerosis (HCC)    Arthritis    lower back and thumbs   Breast cancer of upper-outer quadrant of left female breast (HCC) 09/19/2013   Cancer (HCC)    left breast-(12-12-09 radiation and surgery)-Dr. Darnelle Catalan -oncology   Chronic diastolic CHF (congestive heart failure) (HCC)    Colon polyps    sm aden p 10/06 (difficult exam, needs propofol);neg 07/2010   Dyslipidemia    Edema extremities 02/07/2016   GERD (gastroesophageal reflux disease)    Bar[facility]'s [facility]), neg for metaplasia '97, '08 (mod HH) 9.2011   HTN (hypertension)    Hypothyroidism, postsurgical 04/03/2013   Laryngitis 12/20/2012   recent episode is resolving with Amoxicillin orally   Mild dilation of ascending aorta (HCC)    Obesity    Osteoporosis    Paroxysmal supraventricular tachycardia (HCC) 12/22/2013   Personal history of radiation therapy    2011   PONV (postoperative nausea and vomiting)    Thyroid adenoma 12/20/2012   surgery planned 12-30-12   Vaginal atrophy 08/18/2013   Vaginitis and vulvovaginitis 08/18/2013   Past Surgical History:  Procedure Laterality Date   BREAST LUMPECTOMY Left    2011   BREAST SURGERY     left breast lumpectomy   EYE SURGERY  12-20-12   right eye retina  tear repair   THYROIDECTOMY N/A 12/30/2012   Procedure: THYROIDECTOMY;  Surgeon: Velora Heckler, MD;  Location: [facility];  Service: General;  Laterality: N/A;   Patient Active Problem List   Diagnosis Date Noted   Aortic atherosclerosis (HCC)    Hyperlipidemia LDL goal <70 07/28/2023   Statin myopathy 01/19/2023   Coronary artery disease involving native heart without angina pectoris 01/19/2023   Agatston coronary artery calcium score greater than 400 01/19/2023   BPPV (benign paroxysmal positional vertigo), right 07/11/2021   Fibroma of intraoral region 01/14/2021   Otalgia, left ear 01/14/2021   Temporomandibular joint dysfunction 01/14/2021   Malocclusion 09/02/2020   Anxiety 11/15/2018   Insomnia 11/15/2018   Chronic diastolic heart failure (HCC) 04/18/2018   Benign essential HTN 04/18/2018   Congestive heart failure (HCC) 03/23/2018   Mild dilation of ascending aorta (HCC)    Elevated blood pressure reading without diagnosis of hypertension    Abnormal EKG 02/07/2016   Edema of extremities 02/07/2016   Joint pain 03/19/2014   Paroxysmal supraventricular tachycardia (HCC) 12/22/2013   Breast cancer of upper-outer quadrant of left female breast (HCC) 09/19/2013   Vaginal atrophy 08/18/2013   Vaginitis and vulvovaginitis 08/18/2013   Hypothyroidism, postsurgical 04/03/2013    PCP: Gaspar Garbe, MD  REFERRING PROVIDER: Nadara Mustard, MD   REFERRING DIAG: [phone] (ICD-10-CM) - Ankle ligament laxity, left  THERAPY DIAG:  Pain in left ankle and joints of left foot  Muscle weakness (generalized)  Other abnormalities of gait and mobility  Rationale for Evaluation and Treatment: Rehabilitation  ONSET DATE: date of initial ankle sprain 1999.   SUBJECTIVE:   SUBJECTIVE STATEMENT: Patient states she has had a history of severe eft ankle sprain in 1999. Pain has been worse since December 2024, but has improved some since starting voltaren gel but the strength has not  improved. She does get intermittent N/T and burning in her foot and toes. She does report edema and has blood vessell complications  PERTINENT HISTORY: See above PMH  PAIN:  NPRS scale: 2/10 upon arrival Pain location:lateral left ankle Pain description: achy, intermittent, burning Aggravating factors: walking, standing after sitting for a while Relieving factors: heat, voltaren, elevation   PRECAUTIONS: None  RED FLAGS: None   WEIGHT BEARING RESTRICTIONS: No  FALLS:  Has patient fallen in last 6 months? No  OCCUPATION: retired  PLOF: Independent  PATIENT GOALS: gain strength  NEXT MD VISIT: nothing schedueld at eval  OBJECTIVE:  Note: Objective measures were completed at Evaluation unless otherwise noted.  DIAGNOSTIC FINDINGS:  XR 10/21/23 "AP lateral oblique radiographs left foot reviewed.  No acute fracture.   Tarsometatarsal alignment intact.  No significant arthritis present in the  midfoot or in the tarsometatarsal joints. "  PATIENT SURVEYS:  Patient-Specific Activity Scoring Scheme  "0" represents "unable to perform." "10" represents "able to perform at prior level. 0 1 2 3 4 5 6 7 8 9  10 (Date and Score)   Activity Eval     1. Walking pain free 8     2. Standing after sitting  8    3. Moving without feeling weakness in left foot 8   Score 24/30    Total score = sum of the activity scores/number of activities Minimum detectable change (90%CI) for average score = 2 points Minimum detectable change (90%CI) for single activity score = 3 points     COGNITION: Overall cognitive status: Within functional limits for tasks assessed     EDEMA:  Moderate edema noted left lower leg   LOWER EXTREMITY ROM:  Active ROM Right eval Left eval  Hip flexion    Hip extension    Hip abduction    Hip adduction    Hip internal rotation    Hip external rotation    Knee flexion    Knee extension    Ankle dorsiflexion  5  Ankle plantarflexion  WNL   Ankle inversion  WNL  Ankle eversion  10   (Blank rows = not tested)  LOWER EXTREMITY MMT:  MMT Right eval Left eval  Hip flexion    Hip extension    Hip abduction    Hip adduction    Hip internal rotation    Hip external rotation    Knee flexion    Knee extension    Ankle dorsiflexion  5  Ankle plantarflexion  4  Ankle inversion  4  Ankle eversion  4   (Blank rows = not tested)  FUNCTIONAL TESTS:  Eval: Single leg balance 10 sec bilat, increased sway noted Tandem balance can hold 30 sec but increased sway  [facility] PT Assessment - 01/18/24 0001       Standardized Balance Assessment   Standardized Balance Assessment Dynamic Gait Index  Dynamic Gait Index   Level Surface Normal    Change in Gait Speed Normal    Gait with Horizontal Head Turns Mild Impairment    Gait with Vertical Head Turns Mild Impairment    Gait and Pivot Turn Mild Impairment    Step Over Obstacle Mild Impairment    Step Around Obstacles Normal    Steps Normal    Total Score 20              GAIT: Eval Comments: independent community ambulator, does have increased foot ER, and pronation with ambulation   TODAY'S TREATMENT:  Eval HEP creation and review with demonstration and trial set preformed, see below for details Selfcare: discussed her balance and ways to help improve this as well as ASO brace and when to wear this.     PATIENT EDUCATION: Education details: HEP, PT plan of care Person educated: Patient Education method: Explanation, Demonstration, Verbal cues, and Handouts Education comprehension: verbalized understanding and needs further education   HOME EXERCISE PROGRAM: Access Code: [email or web address] URL: [email or web address] Date: 01/18/2024 Prepared by: Ivery Quale  Exercises - Gastroc Stretch on Wall  - 2 x daily - 3 x weekly - 1 sets - 3 reps - 30 hold - Heel Toe Raises with Counter Support  - 2 x daily - 6 x weekly - 2 sets - 10 reps - Single Leg  Stance  - 2 x daily - 6 x weekly - 1 sets - 3 reps - 15 sec hold - Tandem Walking  - 2 x daily - 6 x weekly - 2 sets - 10 reps - Walking with Head Rotation  - 2 x daily - 6 x weekly - 2 sets - 10 reps - Ankle Inversion with Resistance  - 2 x daily - 6 x weekly - 1 sets - 15-20 reps - Ankle Eversion with Resistance  - 2 x daily - 6 x weekly - 1 sets - 15-20 reps  ASSESSMENT:  CLINICAL IMPRESSION: Patient referred to PT for chronic left ankle pain and ankle instability. Balance testing does place her at a low risk of falling. Patient will benefit from skilled PT to address below impairments, limitations and improve overall function.  OBJECTIVE IMPAIRMENTS: decreased activity tolerance, difficulty walking, decreased balance, decreased endurance, decreased mobility, decreased ROM, decreased strength, impaired flexibility, and pain.  ACTIVITY LIMITATIONS:  locomotion, standing, stairs, community activity  PERSONAL FACTORS: see above PMH are also affecting patient's functional outcome.  REHAB POTENTIAL: Good  CLINICAL DECISION MAKING: Stable/uncomplicated  EVALUATION COMPLEXITY: Low    GOALS: Short term PT Goals Target date: 02/15/2024   Pt will be I and compliant with HEP. Baseline:  Goal status: New Pt will decrease pain by 25% overall with walking one block Baseline: Goal status: New  Long term PT goals Target date:03/14/2024   Pt will improve ankle AROM to [facility] to improve functional mobility Baseline: Goal status: New Pt will improve ankle strength to 5/5 MMT supine and PF in standing to at least 5 single leg calf raises to improve functional strength Baseline: Goal status: New Pt will improve PSFS to at least 27/30 functional to show improved function Baseline: Goal status: New Pt will reduce pain to overall less than 3/10 with walking and sit to stand Baseline: Goal status: New Pt will improve DGI to 22 or better to show improved dynamic balance  PLAN: PT FREQUENCY:  1-2 times per week   PT DURATION: 6-8 weeks  PLANNED INTERVENTIONS (unless contraindicated): aquatic PT,  Canalith repositioning, cryotherapy, Electrical stimulation, Iontophoresis with 4 mg/ml dexamethasome, Moist heat, traction, Ultrasound, gait training, Therapeutic exercise, balance training, neuromuscular re-education, patient/family education, manual techniques, passive ROM, dry needling, taping, vasopnuematic device, vestibular, spinal manipulations, joint manipulations 97110-Therapeutic exercises, 97530- Therapeutic activity, 97112- Neuromuscular re-education, 97535- Self Care, and 16109- Manual therapy  PLAN FOR NEXT SESSION: review HEP, work on ankle strength and stability as tolerated, consider KT tape.    April Manson, PT,DPT 01/18/2024, 12:20 PM

## 2024-01-19 DIAGNOSIS — G4733 Obstructive sleep apnea (adult) (pediatric): Secondary | ICD-10-CM | POA: Diagnosis not present

## 2024-01-19 DIAGNOSIS — Z79899 Other long term (current) drug therapy: Secondary | ICD-10-CM | POA: Diagnosis not present

## 2024-01-19 DIAGNOSIS — E785 Hyperlipidemia, unspecified: Secondary | ICD-10-CM | POA: Diagnosis not present

## 2024-01-19 LAB — LIPID PANEL
Chol/HDL Ratio: 2.1 ratio (ref 0.0–4.4)
Cholesterol, Total: 159 mg/dL (ref 100–199)
HDL: 77 mg/dL (ref >39)
LDL Chol Calc (NIH): 67 mg/dL (ref 0–99)
Triglycerides: 77 mg/dL (ref 0–149)
VLDL Cholesterol Cal: 15 mg/dL (ref 5–40)

## 2024-01-19 LAB — ALT: ALT: 20 IU/L (ref 0–32)

## 2024-01-24 ENCOUNTER — Encounter: Payer: Self-pay | Admitting: Physical Therapy

## 2024-01-24 ENCOUNTER — Ambulatory Visit: Admitting: Physical Therapy

## 2024-01-24 DIAGNOSIS — M25572 Pain in left ankle and joints of left foot: Secondary | ICD-10-CM | POA: Diagnosis not present

## 2024-01-24 DIAGNOSIS — M6281 Muscle weakness (generalized): Secondary | ICD-10-CM | POA: Diagnosis not present

## 2024-01-24 DIAGNOSIS — R2689 Other abnormalities of gait and mobility: Secondary | ICD-10-CM

## 2024-01-24 DIAGNOSIS — G4733 Obstructive sleep apnea (adult) (pediatric): Secondary | ICD-10-CM | POA: Diagnosis not present

## 2024-01-24 NOTE — Therapy (Addendum)
 OUTPATIENT PHYSICAL THERAPY TREATMENT DISCHARGE SUMMARY   Patient Name: [name] MRN: [id number] DOB:Sep 04, 1956, 68 y.o., female Today's Date: 01/24/2024  END OF SESSION:  PT End of Session - 01/24/24 1258     Visit Number 2    Number of Visits 8    Date for PT Re-Evaluation 03/14/24    Progress Note Due on Visit 10    PT Start Time 1300    PT Stop Time 1338    PT Time Calculation (min) 38 min    Activity Tolerance Patient tolerated treatment well    Behavior During Therapy WFL for tasks assessed/performed              Past Medical History:  Diagnosis Date   Agatston coronary artery calcium  score greater than 400    Ca score 860 with normal Stress PET CT 08/2023   Anxiety    Aortic atherosclerosis (HCC)    Arthritis    lower back and thumbs   Breast cancer of upper-outer quadrant of left female breast (HCC) 09/19/2013   Cancer (HCC)    left breast-(12-12-09 radiation and surgery)-Dr. Layla -oncology   Chronic diastolic CHF (congestive heart failure) (HCC)    Colon polyps    sm aden p 10/06 (difficult exam, needs propofol );neg 07/2010   Dyslipidemia    Edema extremities 02/07/2016   GERD (gastroesophageal reflux disease)    [facility]), neg for metaplasia '97, '08 (mod HH) 9.2011   HTN (hypertension)    Hypothyroidism, postsurgical 04/03/2013   Laryngitis 12/20/2012   recent episode is resolving with Amoxicillin orally   Mild dilation of ascending aorta (HCC)    Obesity    Osteoporosis    Paroxysmal supraventricular tachycardia (HCC) 12/22/2013   Personal history of radiation therapy    2011   PONV (postoperative nausea and vomiting)    Thyroid  adenoma 12/20/2012   surgery planned 12-30-12   Vaginal atrophy 08/18/2013   Vaginitis and vulvovaginitis 08/18/2013   Past Surgical History:  Procedure Laterality Date   BREAST LUMPECTOMY Left    2011   BREAST SURGERY     left breast lumpectomy   EYE SURGERY  12-20-12   right eye  retina tear repair   THYROIDECTOMY N/A 12/30/2012   Procedure: THYROIDECTOMY;  Surgeon: Krystal CHRISTELLA Spinner, MD;  Location: [facility];  Service: General;  Laterality: N/A;   Patient Active Problem List   Diagnosis Date Noted   Aortic atherosclerosis (HCC)    Hyperlipidemia LDL goal <70 07/28/2023   Statin myopathy 01/19/2023   Coronary artery disease involving native heart without angina pectoris 01/19/2023   Agatston coronary artery calcium  score greater than 400 01/19/2023   BPPV (benign paroxysmal positional vertigo), right 07/11/2021   Fibroma of intraoral region 01/14/2021   Otalgia, left ear 01/14/2021   Temporomandibular joint dysfunction 01/14/2021   Malocclusion 09/02/2020   Anxiety 11/15/2018   Insomnia 11/15/2018   Chronic diastolic heart failure (HCC) 04/18/2018   Benign essential HTN 04/18/2018   Congestive heart failure (HCC) 03/23/2018   Mild dilation of ascending aorta (HCC)    Elevated blood pressure reading without diagnosis of hypertension    Abnormal EKG 02/07/2016   Edema of extremities 02/07/2016   Joint pain 03/19/2014   Paroxysmal supraventricular tachycardia (HCC) 12/22/2013   Breast cancer of upper-outer quadrant of left female breast (HCC) 09/19/2013   Vaginal atrophy 08/18/2013   Vaginitis and vulvovaginitis 08/18/2013   Hypothyroidism, postsurgical 04/03/2013    PCP: Tisovec, Richard W, MD  REFERRING PROVIDER: Harden Jerona GAILS, MD   REFERRING DIAG: [id number] (ICD-10-CM) - Ankle ligament laxity, left  THERAPY DIAG:  Pain in left ankle and joints of left foot  Muscle weakness (generalized)  Other abnormalities of gait and mobility  Rationale for Evaluation and Treatment: Rehabilitation  ONSET DATE: date of initial ankle sprain 1999.   SUBJECTIVE:   SUBJECTIVE STATEMENT: Has been working on her exercises every day.  C/o annoying, aching pain today  PERTINENT HISTORY: See above PMH  PAIN:  NPRS scale: 3/24: DID NOT RATE Pain location:lateral  left ankle Pain description: achy, intermittent, burning Aggravating factors: walking, standing after sitting for a while Relieving factors: heat, voltaren , elevation   PRECAUTIONS: None  RED FLAGS: None   WEIGHT BEARING RESTRICTIONS: No  FALLS:  Has patient fallen in last 6 months? No  OCCUPATION: retired  PLOF: Independent  PATIENT GOALS: gain strength  NEXT MD VISIT: nothing schedueld at eval  OBJECTIVE:  Note: Objective measures were completed at Evaluation unless otherwise noted.  DIAGNOSTIC FINDINGS:  XR 10/21/23 AP lateral oblique radiographs left foot reviewed.  No acute fracture.   Tarsometatarsal alignment intact.  No significant arthritis present in the  midfoot or in the tarsometatarsal joints.   PATIENT SURVEYS:  Patient-Specific Activity Scoring Scheme  0 represents "unable to perform." 10 represents "able to perform at prior level. 0 1 2 3 4 5 6 7 8 9  10 (Date and Score)   Activity Eval     1. Walking pain free 8     2. Standing after sitting 8    3. Moving without feeling weakness in left foot 8   Score 24/30    Total score = sum of the activity scores/number of activities Minimum detectable change (90%CI) for average score = 2 points Minimum detectable change (90%CI) for single activity score = 3 points     COGNITION: Overall cognitive status: Within functional limits for tasks assessed     EDEMA:  Moderate edema noted left lower leg   LOWER EXTREMITY ROM:  Active ROM Right eval Left eval  Ankle dorsiflexion  5  Ankle plantarflexion  WNL  Ankle inversion  WNL  Ankle eversion  10   (Blank rows = not tested)  LOWER EXTREMITY MMT:  MMT Right eval Left eval  Ankle dorsiflexion  5  Ankle plantarflexion  4  Ankle inversion  4  Ankle eversion  4   (Blank rows = not tested)  FUNCTIONAL TESTS:  Eval: Single leg balance 10 sec bilat, increased sway noted Tandem balance can hold 30 sec but increased  sway     GAIT: Eval Comments: independent community ambulator, does have increased foot ER, and pronation with ambulation   TODAY'S TREATMENT:  01/24/24 TherEx NuStep L5 x 8 min Standing gastroc stretch x 30 sec bil Heel toe raises x 20 reps with light UE support. Seated ankle inversion/eversion x 20 reps on Lt; L3 band   Neuro Re-Ed SLS 2x15 sec bil; intermittent UE support Tandem walking x 3 laps at counter; intermittent UE support Standing gaze x 1 on complaint surface; horizontal and vertical x 30 sec each Amb with head turns 25' x 6    01/18/24 HEP creation and review with demonstration and trial set preformed, see below for details Selfcare: discussed her balance and ways to help improve this as well as ASO brace and when to wear this.     PATIENT EDUCATION: Education details: HEP, PT plan of care Person educated: Patient Education  method: Explanation, Demonstration, Verbal cues, and Handouts Education comprehension: verbalized understanding and needs further education   HOME EXERCISE PROGRAM: Access Code: [email or web address] URL: [email or web address] Date: 01/24/2024 Prepared by: Corean Ku  Exercises - Gastroc Stretch on Wall  - 2 x daily - 3 x weekly - 1 sets - 3 reps - 30 hold - Heel Toe Raises with Counter Support  - 2 x daily - 6 x weekly - 2 sets - 10 reps - Single Leg Stance  - 2 x daily - 6 x weekly - 1 sets - 3 reps - 15 sec hold - Tandem Walking  - 2 x daily - 6 x weekly - 2 sets - 10 reps - Walking with Head Rotation  - 2 x daily - 6 x weekly - 2 sets - 10 reps - Ankle Inversion with Resistance  - 2 x daily - 6 x weekly - 1 sets - 15-20 reps - Ankle Eversion with Resistance  - 2 x daily - 6 x weekly - 1 sets - 15-20 reps - Standing Gaze Stabilization with Head Rotation  - 2 x daily - 7 x weekly - 1 sets - 30 seconds  ASSESSMENT:  CLINICAL IMPRESSION: Pt today focused on review of HEP which pt demonstrating good understanding at  this time.  Did add gaze x 1 on compliant surface to work on balance and vestibular function.  Will continue to benefit from PT to maximize function.   OBJECTIVE IMPAIRMENTS: decreased activity tolerance, difficulty walking, decreased balance, decreased endurance, decreased mobility, decreased ROM, decreased strength, impaired flexibility, and pain.  ACTIVITY LIMITATIONS:  locomotion, standing, stairs, community activity  PERSONAL FACTORS: see above PMH are also affecting patient's functional outcome.  REHAB POTENTIAL: Good  CLINICAL DECISION MAKING: Stable/uncomplicated  EVALUATION COMPLEXITY: Low    GOALS: Short term PT Goals Target date: 02/15/2024   Pt will be I and compliant with HEP. Baseline:  Goal status: New Pt will decrease pain by 25% overall with walking one block Baseline: Goal status: New  Long term PT goals Target date:03/14/2024   Pt will improve ankle AROM to [facility] to improve functional mobility Baseline: Goal status: New Pt will improve ankle strength to 5/5 MMT supine and PF in standing to at least 5 single leg calf raises to improve functional strength Baseline: Goal status: New Pt will improve PSFS to at least 27/30 functional to show improved function Baseline: Goal status: New Pt will reduce pain to overall less than 3/10 with walking and sit to stand Baseline: Goal status: New Pt will improve DGI to 22 or better to show improved dynamic balance  PLAN: PT FREQUENCY: 1-2 times per week   PT DURATION: 6-8 weeks  PLANNED INTERVENTIONS (unless contraindicated): aquatic PT, Canalith repositioning, cryotherapy, Electrical stimulation, Iontophoresis with 4 mg/ml dexamethasome, Moist heat, traction, Ultrasound, gait training, Therapeutic exercise, balance training, neuromuscular re-education, patient/family education, manual techniques, passive ROM, dry needling, taping, vasopnuematic device, vestibular, spinal manipulations, joint  manipulations 97110-Therapeutic exercises, 97530- Therapeutic activity, 97112- Neuromuscular re-education, 97535- Self Care, and [id number]- Manual therapy  PLAN FOR NEXT SESSION: compliant surface activities; vestibular exercises PRN, work on ankle strength and stability as tolerated, consider KT tape.    Corean Ku, PT,DPT 01/24/2024, 1:41 PM   PHYSICAL THERAPY DISCHARGE SUMMARY  Visits from Start of Care: 2  Current functional level related to goals / functional outcomes: See above   Remaining deficits: See above   Education / Equipment: HEP   Patient agrees to discharge. Patient goals were  not met. Patient is being discharged due to not returning since the last visit.  Corean JULIANNA Ku, PT, DPT 05/03/24 1:25 PM  [facility] [address] [place], [place], [address] Phone: [phone]   Fax:  [phone]

## 2024-01-25 NOTE — Progress Notes (Unsigned)
 Date:  01/26/2024   ID:  [name], DOB 1956/09/10, MRN [id number] The patient was identified using 2 identifiers.  PCP:  Tisovec, Adelfa Koh, MD   [facility] Providers Cardiologist:  Armanda Magic, MD     Evaluation Performed:  Follow-Up Visit  Chief Complaint: OSA  History of Present Illness:    [name] is a 68 y.o. female with a hx of PSVT, mildly dilated ascending aorta, thyroid adenoma, remote breast CA, former tobacco abuse, dyslipidemia (followed by PCP), GERD, recent mild HTN, obesity.  Her last echo on 09/16/2017 showed normal LV function with EF 60 to 65% with grade 2 diastolic dysfunction, mildly dilated ascending aorta at 38 mm.   She underwent a sleep study due to excessive daytime sleepiness and snoring with a STOP BANG SCORE of 5.  HST showed moderate OSA with an AHI of 23/hr and nocturnal hypoxemia.  She underwent CPAP titration to 6cm H2O.  She is now here for sleep followup.    She is here today for followup and is doing well.  She has chronic DOE and LE edema that is unchanged from last OV. She denies any chest pain or pressure, PND, orthopnea, dizziness, palpitations or syncope. She is compliant with her meds and is tolerating meds with no SE.    She is doing well with her PAP device and thinks that she has gotten used to it.  She tolerates the mask and feels the pressure is adequate.  She has been feeling groggy in the am but she thinks it is because she sleeps deeply and now is dreaming more. She does not get any significant daytime sleepiness but will nap after water aerobics.  She denies any significant mouth or nasal dryness.  She has been having nasal congestion due to allergies.  She does not think that he snores.    Past Medical History:  Diagnosis Date   Agatston coronary artery calcium score greater than 400    Ca score 860 with normal Stress PET CT 08/2023   Anxiety    Aortic atherosclerosis (HCC)     Arthritis    lower back and thumbs   Breast cancer of upper-outer quadrant of left female breast (HCC) 09/19/2013   Cancer (HCC)    left breast-(12-12-09 radiation and surgery)-Dr. Darnelle Catalan -oncology   Chronic diastolic CHF (congestive heart failure) (HCC)    Colon polyps    sm aden p 10/06 (difficult exam, needs propofol);neg 07/2010   Dyslipidemia    Edema extremities 02/07/2016   GERD (gastroesophageal reflux disease)    Barrett's [facility]), neg for metaplasia '97, '08 (mod HH) 9.2011   HTN (hypertension)    Hypothyroidism, postsurgical 04/03/2013   Laryngitis 12/20/2012   recent episode is resolving with Amoxicillin orally   Mild dilation of ascending aorta (HCC)    Obesity    Osteoporosis    Paroxysmal supraventricular tachycardia (HCC) 12/22/2013   Personal history of radiation therapy    2011   PONV (postoperative nausea and vomiting)    Thyroid adenoma 12/20/2012   surgery planned 12-30-12   Vaginal atrophy 08/18/2013   Vaginitis and vulvovaginitis 08/18/2013   Past Surgical History:  Procedure Laterality Date   BREAST LUMPECTOMY Left    2011   BREAST SURGERY     left breast lumpectomy   EYE SURGERY  12-20-12   right eye retina tear repair   THYROIDECTOMY N/A 12/30/2012   Procedure: THYROIDECTOMY;  Surgeon: Velora Heckler, MD;  Location: [facility];  Service: General;  Laterality: N/A;     Current Meds  Medication Sig   alendronate (FOSAMAX) 70 MG tablet Take 70 mg by mouth once a week.   ALPRAZolam (XANAX) 1 MG tablet Take 1 mg by mouth at bedtime as needed for sleep.   aspirin EC 81 MG tablet Take 81 mg by mouth every other day. Sundays and Thursdays   betamethasone dipropionate 0.05 % cream Apply topically.   Cetirizine HCl (ZYRTEC ALLERGY) 10 MG CAPS Zyrtec 10 mg capsule Take by oral route.   clobetasol (OLUX) 0.05 % topical foam    CRANBERRY PO Take 500 mg by mouth daily.   cycloSPORINE (RESTASIS) 0.05 % ophthalmic emulsion 1  into affected eye Ophthalmic Twice a  day   diltiazem (CARDIZEM CD) 120 MG 24 hr capsule Take 1 capsule by mouth once daily in the morning   empagliflozin (JARDIANCE) 10 MG TABS tablet TAKE 1 TABLET BY MOUTH ONCE DAILY IN THE MORNING   empagliflozin (JARDIANCE) 10 MG TABS tablet Take 1 tablet (10 mg total) by mouth daily before breakfast.   Evolocumab (REPATHA SURECLICK) 140 MG/ML SOAJ Inject 140 mg into the skin every 14 (fourteen) days.   ezetimibe (ZETIA) 10 MG tablet Take 1 tablet (10 mg total) by mouth daily.   Fluocinolone Acetonide Scalp 0.01 % OIL    fluticasone (CUTIVATE) 0.05 % cream Apply topically 2 (two) times daily as needed.   furosemide (LASIX) 20 MG tablet Take 1 tablet by mouth daily   halobetasol (ULTRAVATE) 0.05 % cream halobetasol propionate 0.05 % topical cream APPLY TO AFFECTED AREA UP TO TWICE A DAY AS NEEDED *NOT TO FACE, GROIN, OR UNDERARMS*   hydrOXYzine (ATARAX) 25 MG tablet 1 tablet as needed Orally at night for 30 days   hydrOXYzine (VISTARIL) 25 MG capsule Take 1 capsule by mouth daily as needed. itching   meclizine (ANTIVERT) 25 MG tablet TAKE 1 TABLET BY MOUTH EVERY 8 HOURS AS NEEDED FOR 14 DAYS FOR VERTIGO OR SPINNING   Multiple Vitamin (MULTIVITAMIN ADULT PO) Take 1 Dose by mouth daily. 2 gummies daily   Omega 3 1000 MG CAPS Take 2,000 mg by mouth daily.   pantoprazole (PROTONIX) 40 MG tablet Take 1 tablet (40 mg total) daily by mouth.   sertraline (ZOLOFT) 100 MG tablet Take 150 mg by mouth daily.   SYNTHROID 112 MCG tablet Take 224 mcg by mouth every morning.     Allergies:   Crestor [rosuvastatin], Pravastatin sodium, Sulfa antibiotics, Sulfa drugs cross reactors, and Venlafaxine   Social History   Tobacco Use   Smoking status: Former    Current packs/day: 0.00    Types: Cigarettes    Quit date: 01/31/2010    Years since quitting: 13.9   Smokeless tobacco: Never  Vaping Use   Vaping status: Never Used  Substance Use Topics   Alcohol use: Yes    Comment: social- occ   Drug use: No      Family Hx: The patient's family history includes Cancer in her brother; Heart disease in her father; Stroke in her mother.  ROS:   Please see the history of present illness.     All other systems reviewed and are negative.   Prior Sleep studies:   The following studies were reviewed today:  EKG Interpretation Date/Time:  Wednesday January 26 2024 08:16:28 EDT Ventricular Rate:  55 PR Interval:  202 QRS Duration:  96 QT Interval:  430 QTC Calculation: 411 R Axis:   1  Text Interpretation: Sinus bradycardia Cannot rule out Anterior infarct (cited on or before 29-May-2021) When compared with ECG of 29-May-2021 13:41, No significant change was found Confirmed by Mayford Knife, Rinnah Peppel ([id number]) on 01/26/2024 8:26:26 AM    HST and PAP compliance download  Labs/Other Tests and Data Reviewed:     Recent Labs: 07/28/2023: BUN 15; Creatinine, Ser 0.79; Potassium 4.4; Sodium 142 01/19/2024: ALT 20   Wt Readings from Last 3 Encounters:  01/26/24 234 lb (106.1 kg)  07/27/23 222 lb (100.7 kg)  02/18/23 230 lb (104.3 kg)     Risk Assessment/Calculations:      Objective:    Vital Signs:  BP (!) 144/70   Pulse (!) 55   Ht 5\' 5"  (1.651 m)   Wt 234 lb (106.1 kg)   SpO2 93%   BMI 38.94 kg/m   GEN: Well nourished, well developed in no acute distress HEENT: Normal NECK: No JVD; No carotid bruits LYMPHATICS: No lymphadenopathy CARDIAC:RRR, no murmurs, rubs, gallops RESPIRATORY:  Clear to auscultation without rales, wheezing or rhonchi  ABDOMEN: Soft, non-tender, non-distended MUSCULOSKELETAL:  No edema; No deformity  SKIN: Warm and dry NEUROLOGIC:  Alert and oriented x 3 PSYCHIATRIC:  Normal affect  ASSESSMENT & PLAN:    OSA - The patient is tolerating PAP therapy well without any problems. The PAP download performed by his DME was personally reviewed and interpreted by me today and showed an AHI of 0.7/hr on 6 cm H2O with 100% compliance in using more than 4 hours nightly.  The patient  has been using and benefiting from PAP use and will continue to benefit from therapy.  -she has been having allergies with nasal congestion due to allergies -I encouraged her to take Flonase 1 spray each nostril qam as well as nasal saline 2 spray each nostril BID -She also takes Zyrtec daily  HTN -BP borderline controlled on exam today>>she just took her BP meds and she has been having ankle pain -continue prescription drug management with Cardizem CD 120mg  daily with PRN refills  Coronary Calcifications -coronary Ca score elevated at 860 in March 2024 -Stress PET CT 08/2023 showed no ischemia with mildly abnormal MBFR due to high rest flow and overall are normal -she denies any anginal sx -continue prescription drug management with ASA 81mg  daily and Repatha -she is statin intolerant  HLD -LDL goal < 70 -statin intolerant -I have personally reviewed and interpreted outside labs performed by patient's PCP which showed LDL 67, HDL 77 and ALT 20 on 01/19/2024 -continue prescription drug management with Repatha  and Zetia 10mg  daily with PRN refills  PSVT -she has not had any palpitations -continue prescription drug management with Cardizem CD 120mg  daily with PRN refills    Chronic diastolic CHF -her chronic SOB and LE edema remain stable -continue compression hose for LE edema -continue prescription drug management with Lasix 20 mg daily with as needed refills -she will continue to use compression hose during the day -continue prescription drug management with Lasix 20mg  daily and Jardiance 10mg  daily with PRN refills -I have personally reviewed and interpreted outside labs performed by patient's PCP which showed serum creatinine 0.79 potassium 4.4 on 07/28/2023  Morbid Obesity -she is very frustrated and cannot lose weight -she really wants to lose weight -I will refer her to PharmD to consideration of weight loss meds   Medication Adjustments/Labs and Tests Ordered: Current  medicines are reviewed at length with the  patient today.  Concerns regarding medicines are outlined abov  Tests Ordered: Orders Placed This Encounter  Procedures   EKG 12-Lead    Medication Changes: No orders of the defined types were placed in this encounter.   Follow Up:  In Person in 6 month(s)  Signed, Armanda Magic, MD  01/26/2024 8:30 AM    [facility]

## 2024-01-26 ENCOUNTER — Ambulatory Visit: Payer: PPO | Attending: Cardiology | Admitting: Cardiology

## 2024-01-26 ENCOUNTER — Encounter: Payer: Self-pay | Admitting: Cardiology

## 2024-01-26 VITALS — BP 144/70 | HR 55 | Ht 65.0 in | Wt 234.0 lb

## 2024-01-26 DIAGNOSIS — I471 Supraventricular tachycardia, unspecified: Secondary | ICD-10-CM

## 2024-01-26 DIAGNOSIS — I2510 Atherosclerotic heart disease of native coronary artery without angina pectoris: Secondary | ICD-10-CM | POA: Diagnosis not present

## 2024-01-26 DIAGNOSIS — I10 Essential (primary) hypertension: Secondary | ICD-10-CM

## 2024-01-26 DIAGNOSIS — G4733 Obstructive sleep apnea (adult) (pediatric): Secondary | ICD-10-CM

## 2024-01-26 DIAGNOSIS — I5032 Chronic diastolic (congestive) heart failure: Secondary | ICD-10-CM | POA: Diagnosis not present

## 2024-01-26 DIAGNOSIS — R931 Abnormal findings on diagnostic imaging of heart and coronary circulation: Secondary | ICD-10-CM | POA: Diagnosis not present

## 2024-01-26 DIAGNOSIS — I509 Heart failure, unspecified: Secondary | ICD-10-CM

## 2024-01-26 DIAGNOSIS — E785 Hyperlipidemia, unspecified: Secondary | ICD-10-CM

## 2024-01-26 MED ORDER — EMPAGLIFLOZIN 10 MG PO TABS: 10.0000 mg | ORAL_TABLET | Freq: Every morning | ORAL | 3 refills | Status: AC

## 2024-01-26 MED ORDER — DILTIAZEM HCL ER COATED BEADS 120 MG PO CP24: ORAL_CAPSULE | ORAL | 3 refills | Status: AC

## 2024-01-26 MED ORDER — REPATHA SURECLICK 140 MG/ML ~~LOC~~ SOAJ: 1.0000 mL | SUBCUTANEOUS | 3 refills | Status: AC

## 2024-01-26 MED ORDER — EZETIMIBE 10 MG PO TABS: 10.0000 mg | ORAL_TABLET | Freq: Every day | ORAL | 3 refills | Status: AC

## 2024-01-26 MED ORDER — FUROSEMIDE 20 MG PO TABS: 20.0000 mg | ORAL_TABLET | Freq: Every day | ORAL | 3 refills | Status: AC

## 2024-01-26 NOTE — Addendum Note (Signed)
 Addended by: Michaelle Copas on: 01/26/2024 08:50 AM   Modules accepted: Orders

## 2024-01-26 NOTE — Patient Instructions (Addendum)
 Medication Instructions:  Your physician recommends that you continue on your current medications as directed. Please refer to the Current Medication list given to you today.  *If you need a refill on your cardiac medications before your next appointment, please call your pharmacy*  Lab Work: None ordered.  You may go to any [facility] Location for your lab work:  [place] - [address] ([facility]) - [id number] [address]  [place] - [address]   [place]  - [address]   [place] - [address] - [address] [employer]  - [address] - [address] ([facility]   If you have labs (blood work) drawn today and your tests are completely normal, you will receive your results only by: [employer] (if you have MyChart)  If you have any lab test that is abnormal or we need to change your treatment, we will call you or send a MyChart message to review the results.  Testing/Procedures: None ordered.  Follow-Up: At [facility], you and your health needs are our priority.  As part of our continuing mission to provide you with exceptional heart care, we have created designated Provider Care Teams.  These Care Teams include your primary Cardiologist (physician) and Advanced Practice Providers (APPs -  Physician Assistants and Nurse Practitioners) who all work together to provide you with the care you need, when you need it.  Your next appointment:   1 year(s)  The format for your next appointment:   In Person  Provider:   Armanda Magic, MD           Valet parking services will be available as well.    YOU HAVE BEEN REFERRED TO SEE PHARM D FOR WEIGHT LOSS MANAGEMENT.

## 2024-01-31 ENCOUNTER — Telehealth: Payer: Self-pay | Admitting: Orthopedic Surgery

## 2024-01-31 NOTE — Telephone Encounter (Signed)
 Pt called stating she going back to Dr August Saucer to request an MRI. Pt phone number is [phone].

## 2024-01-31 NOTE — Telephone Encounter (Signed)
 Noted. FYI

## 2024-02-01 ENCOUNTER — Encounter: Admitting: Physical Therapy

## 2024-02-01 DIAGNOSIS — M24272 Disorder of ligament, left ankle: Secondary | ICD-10-CM

## 2024-02-01 DIAGNOSIS — M25572 Pain in left ankle and joints of left foot: Secondary | ICD-10-CM

## 2024-02-01 NOTE — Telephone Encounter (Signed)
 What kind of MRI she requesting?

## 2024-02-02 NOTE — Telephone Encounter (Signed)
 Mri ordered

## 2024-02-02 NOTE — Telephone Encounter (Signed)
 Based on the last note, I would recommend MRI of the left ankle

## 2024-02-07 ENCOUNTER — Encounter: Admitting: Physical Therapy

## 2024-02-09 ENCOUNTER — Ambulatory Visit
Admission: RE | Admit: 2024-02-09 | Discharge: 2024-02-09 | Disposition: A | Discharge status: Home / Self Care | Source: Ambulatory Visit | Attending: Orthopedic Surgery | Admitting: Orthopedic Surgery

## 2024-02-09 DIAGNOSIS — M24272 Disorder of ligament, left ankle: Secondary | ICD-10-CM | POA: Diagnosis not present

## 2024-02-09 DIAGNOSIS — M25572 Pain in left ankle and joints of left foot: Secondary | ICD-10-CM

## 2024-02-11 ENCOUNTER — Ambulatory Visit: Admitting: Orthopedic Surgery

## 2024-02-19 DIAGNOSIS — G4733 Obstructive sleep apnea (adult) (pediatric): Secondary | ICD-10-CM | POA: Diagnosis not present

## 2024-02-23 ENCOUNTER — Ambulatory Visit: Admitting: Orthopedic Surgery

## 2024-02-23 DIAGNOSIS — M25572 Pain in left ankle and joints of left foot: Secondary | ICD-10-CM | POA: Diagnosis not present

## 2024-02-25 ENCOUNTER — Encounter: Payer: Self-pay | Admitting: Orthopedic Surgery

## 2024-02-25 NOTE — Progress Notes (Signed)
 Office Visit Note   Patient: [name]           Date of Birth: 05-24-1956           MRN: [id number] Visit Date: 02/23/2024 Requested by: Suzzanne Estrin, MD [address] [place],  [place] [address] PCP: Kandyce Ort Kristina Pfeiffer, MD  Subjective: Chief Complaint  Patient presents with   Other    Follow up to review MRI    HPI: [name] is a 68 y.o. female who presents to the office reporting left ankle pain.  Since she was last seen she has had an MRI scan.  The MRI scan shows primarily midfoot arthritis.  She does report some numbness in the second toe.  Nothing really ulnar nerve on the MRI scan.  States the pain moves around.  When she sits to get up she has pain in the ankle.  The intensity however has decreased.  Right foot okay.  Official MRI reading is mild to moderate degenerative changes in the midfoot particularly in the 2nd and 3rd tarsometatarsal joints as well as in the hindfoot and the calcaneocuboid joint.  No significant ligamentous or musculotendinous abnormality. .                ROS: All systems reviewed are negative as they relate to the chief complaint within the history of present illness.  Patient denies fevers or chills.  Assessment & Plan: Visit Diagnoses:  1. Pain in left ankle and joints of left foot     Plan: Impression is left ankle pain and midfoot arthritis.  She states that she potentially could be amenable to an intervention.  Her best bet for this problem would be possible differential injections with Dr. Julio Ohm.  Her shoewear looks pretty reasonable.  Posterior tib tendon is functional without too much in terms of pes planus.  Follow-up with Dr. Julio Ohm for further evaluation and management.  I do not know if this is bad enough for fusion but she is having a fair amount of pain.  Follow-Up Instructions: No follow-ups on file.   Orders:  Orders Placed This Encounter  Procedures   Ambulatory referral to Orthopedic  Surgery   No orders of the defined types were placed in this encounter.     Procedures: No procedures performed   Clinical Data: No additional findings.  Objective: Vital Signs: There were no vitals taken for this visit.  Physical Exam:  Constitutional: Patient appears well-developed HEENT:  Head: Normocephalic Eyes:EOM are normal Neck: Normal range of motion Cardiovascular: Normal rate Pulmonary/chest: Effort normal Neurologic: Patient is alert Skin: Skin is warm Psychiatric: Patient has normal mood and affect  Ortho Exam: Ortho exam demonstrates palpable pedal pulses.  Palpable intact nontender anterior to posterior to peroneal and Achilles tendons.  Slightly tight heel cord on both the left and right-hand side.  Not much swelling in the ankle.  No definite Mulder's click and no definite tenderness to palpation between the metatarsal heads and the left foot and ankle.  Ankle itself is stable.  Specialty Comments:  MRI LUMBAR SPINE WITHOUT CONTRAST   TECHNIQUE: Multiplanar, multisequence MR imaging of the lumbar spine was performed. No intravenous contrast was administered.   COMPARISON:  Radiographs dated December 31, 2021   FINDINGS: Segmentation:  Standard.   Alignment: Mild acute kyphotic angulation at T11-T12 due to compression fracture of T12. Mild anterolisthesis of L4.   Vertebrae: Compression deformity of T12 vertebral body with approximately  50% vertebral body height loss, without evidence of edema likely a chronic process.   Conus medullaris and cauda equina: Conus extends to the L1-L2 level. Conus and cauda equina appear normal.   Paraspinal and other soft tissues: Nonspecific subcutaneous soft tissue edema.   Disc spaces:   T11-T12: Disc height loss with disc protrusion into the central canal. Mild central canal narrowing. No significant neural foraminal narrowing. Mild facet joint arthropathy.   T12-L1: No significant disc bulge. No neural  foraminal stenosis. No central canal stenosis.   L1-L2: No significant disc bulge. No neural foraminal stenosis. No central canal stenosis.   L2-L3: No significant disc bulge. No neural foraminal stenosis. No central canal stenosis. Bilateral facet joint arthropathy, left worse than the right.   L3-L4: Mild disc protrusion with mild narrowing of lateral recesses bilaterally. Mild ligamentum flavum hypertrophy. Moderate facet joint arthropathy. No significant neural foraminal stenosis.   L4-L5: Moderate circumferential disc protrusion and ligamentum flavum hypertrophy. Mild narrowing of lateral recesses bilaterally. Moderate facet joint arthropathy. No significant neural foraminal stenosis.   L5-S1: Disc height loss and broad-base disc protrusion. Narrowing of bilateral lateral recesses. Moderate facet joint hypertrophy. Mild bilateral neural foraminal stenosis, right worse than the left.   IMPRESSION: 1. Compression deformity of T12 vertebral body with approximately 50% vertebral body height loss, likely a chronic process.   2. Multilevel degenerate disc disease most prominent at L4-L5 and L5-S1 with mild bilateral neural foraminal stenosis at L5-S1.     Electronically Signed   By: Imran  Ahmed D.O.   On: 01/05/2022 09:00  Imaging: No results found.   PMFS History: Patient Active Problem List   Diagnosis Date Noted   Aortic atherosclerosis (HCC)    Hyperlipidemia LDL goal <70 07/28/2023   Statin myopathy 01/19/2023   Coronary artery disease involving native heart without angina pectoris 01/19/2023   Agatston coronary artery calcium  score greater than 400 01/19/2023   BPPV (benign paroxysmal positional vertigo), right 07/11/2021   Fibroma of intraoral region 01/14/2021   Otalgia, left ear 01/14/2021   Temporomandibular joint dysfunction 01/14/2021   Malocclusion 09/02/2020   Anxiety 11/15/2018   Insomnia 11/15/2018   Chronic diastolic heart failure (HCC)  16/08/9603   Benign essential HTN 04/18/2018   Congestive heart failure (HCC) 03/23/2018   Mild dilation of ascending aorta (HCC)    Elevated blood pressure reading without diagnosis of hypertension    Abnormal EKG 02/07/2016   Edema of extremities 02/07/2016   Joint pain 03/19/2014   Paroxysmal supraventricular tachycardia (HCC) 12/22/2013   Breast cancer of upper-outer quadrant of left female breast (HCC) 09/19/2013   Vaginal atrophy 08/18/2013   Vaginitis and vulvovaginitis 08/18/2013   Hypothyroidism, postsurgical 04/03/2013   Past Medical History:  Diagnosis Date   Agatston coronary artery calcium  score greater than 400    Ca score 860 with normal Stress PET CT 08/2023   Anxiety    Aortic atherosclerosis (HCC)    Arthritis    lower back and thumbs   Breast cancer of upper-outer quadrant of left female breast (HCC) 09/19/2013   Cancer (HCC)    left breast-(12-12-09 radiation and surgery)-Dr. Charolett Copes -oncology   Chronic diastolic CHF (congestive heart failure) (HCC)    Colon polyps    sm aden p 10/06 (difficult exam, needs propofol );neg 07/2010   Dyslipidemia    Edema extremities 02/07/2016   GERD (gastroesophageal reflux disease)    [facility]'s [facility]), neg for metaplasia '97, '08 (mod HH) 9.2011   HTN (hypertension)  Hypothyroidism, postsurgical 04/03/2013   Laryngitis 12/20/2012   recent episode is resolving with Amoxicillin orally   Mild dilation of ascending aorta (HCC)    Obesity    Osteoporosis    Paroxysmal supraventricular tachycardia (HCC) 12/22/2013   Personal history of radiation therapy    2011   PONV (postoperative nausea and vomiting)    Thyroid  adenoma 12/20/2012   surgery planned 12-30-12   Vaginal atrophy 08/18/2013   Vaginitis and vulvovaginitis 08/18/2013    Family History  Problem Relation Age of Onset   Stroke Mother    Heart disease Father    Cancer Brother        melanoma    Past Surgical History:  Procedure Laterality Date   BREAST  LUMPECTOMY Left    2011   BREAST SURGERY     left breast lumpectomy   EYE SURGERY  12-20-12   right eye retina tear repair   THYROIDECTOMY N/A 12/30/2012   Procedure: THYROIDECTOMY;  Surgeon: Keitha Pata, MD;  Location: [facility];  Service: General;  Laterality: N/A;   Social History   Occupational History   Not on file  Tobacco Use   Smoking status: Former    Current packs/day: 0.00    Types: Cigarettes    Quit date: 01/31/2010    Years since quitting: 14.0   Smokeless tobacco: Never  Vaping Use   Vaping status: Never Used  Substance and Sexual Activity   Alcohol  use: Yes    Comment: social- occ   Drug use: No   Sexual activity: Yes    Birth control/protection: Post-menopausal

## 2024-02-29 ENCOUNTER — Ambulatory Visit: Admitting: Orthopedic Surgery

## 2024-02-29 DIAGNOSIS — M25572 Pain in left ankle and joints of left foot: Secondary | ICD-10-CM | POA: Diagnosis not present

## 2024-02-29 DIAGNOSIS — G4733 Obstructive sleep apnea (adult) (pediatric): Secondary | ICD-10-CM | POA: Diagnosis not present

## 2024-03-02 ENCOUNTER — Telehealth: Payer: Self-pay | Admitting: Cardiology

## 2024-03-02 NOTE — Telephone Encounter (Signed)
 Pt would like to turn the pressure up on her CPAP machine she would like to know what she should change it to. Please advise

## 2024-03-02 NOTE — Telephone Encounter (Signed)
 Message sent to sleep study pool.

## 2024-03-03 ENCOUNTER — Encounter: Payer: Self-pay | Admitting: Orthopedic Surgery

## 2024-03-03 NOTE — Progress Notes (Unsigned)
 Office Visit Note   Patient: [name]           Date of Birth: 01-31-1956           MRN: [id number] Visit Date: 02/29/2024              Requested by: Tisovec, Richard W, MD [address] [place],  [place] [address] PCP: Suzzanne Estrin, MD  Chief Complaint  Patient presents with   Left Ankle - Pain   Left Foot - Pain      HPI: Patient is a 68 year old woman who presents in follow-up for left foot and ankle pain she is status post an MRI scan.  Patient states that she is about 20% better than December she has sporadic pain with standing she has had no relief with Voltaren  gel.  Patient states that occasionally all of her toes tingle.  Assessment & Plan: Visit Diagnoses:  1. Pain in left ankle and joints of left foot     Plan: Recommended a stiff soled shoe to unload pressure and an over-the-counter orthotic such as sole.   Follow-Up Instructions: Return if symptoms worsen or fail to improve.   Ortho Exam  Patient is alert, oriented, no adenopathy, well-dressed, normal affect, normal respiratory effort. Examination patient has tenderness to palpation across the midfoot.  She does have varicose veins but no ulcers.  Radiograph shows a long 2nd and 3rd metatarsal but no fractures.  She has a good dorsalis pedis pulse.  She now has pain at the base of the 4th and 5th metatarsals.  She has dorsiflexion 20 degrees past neutral.  Review of the MRI scan shows arthritic changes across the midfoot.  No fractures.  Imaging: No results found. No images are attached to the encounter.  Labs: No results found for: "HGBA1C", "ESRSEDRATE", "CRP", "LABURIC", "REPTSTATUS", "GRAMSTAIN", "CULT", "LABORGA"   Lab Results  Component Value Date   ALBUMIN 3.6 03/13/2015   ALBUMIN 3.7 09/13/2014   ALBUMIN 3.9 04/25/2014    No results found for: "MG" Lab Results  Component Value Date   VD25OH 43 09/22/2011   VD25OH 40 12/23/2010    No results found for:  "PREALBUMIN"    Latest Ref Rng & Units 05/29/2021    1:55 PM 09/10/2017    3:12 PM 03/13/2015   12:03 PM  CBC EXTENDED  WBC 4.0 - 10.5 K/uL 4.4  6.2  5.8   RBC 3.87 - 5.11 MIL/uL 4.42  4.20  4.18   Hemoglobin 12.0 - 15.0 g/dL 19.1  47.8  29.5   HCT 36.0 - 46.0 % 36.8  36.1  37.7   Platelets 150 - 400 K/uL 223  316  264   NEUT# 1.5 - 6.5 10e3/uL   3.7   Lymph# 0.9 - 3.3 10e3/uL   1.4      There is no height or weight on file to calculate BMI.  Orders:  No orders of the defined types were placed in this encounter.  No orders of the defined types were placed in this encounter.    Procedures: No procedures performed  Clinical Data: No additional findings.  ROS:  All other systems negative, except as noted in the HPI. Review of Systems  Objective: Vital Signs: There were no vitals taken for this visit.  Specialty Comments:  MRI LUMBAR SPINE WITHOUT CONTRAST   TECHNIQUE: Multiplanar, multisequence MR imaging of the lumbar spine was performed. No intravenous contrast was administered.   COMPARISON:  Radiographs dated  December 31, 2021   FINDINGS: Segmentation:  Standard.   Alignment: Mild acute kyphotic angulation at T11-T12 due to compression fracture of T12. Mild anterolisthesis of L4.   Vertebrae: Compression deformity of T12 vertebral body with approximately 50% vertebral body height loss, without evidence of edema likely a chronic process.   Conus medullaris and cauda equina: Conus extends to the L1-L2 level. Conus and cauda equina appear normal.   Paraspinal and other soft tissues: Nonspecific subcutaneous soft tissue edema.   Disc spaces:   T11-T12: Disc height loss with disc protrusion into the central canal. Mild central canal narrowing. No significant neural foraminal narrowing. Mild facet joint arthropathy.   T12-L1: No significant disc bulge. No neural foraminal stenosis. No central canal stenosis.   L1-L2: No significant disc bulge. No neural  foraminal stenosis. No central canal stenosis.   L2-L3: No significant disc bulge. No neural foraminal stenosis. No central canal stenosis. Bilateral facet joint arthropathy, left worse than the right.   L3-L4: Mild disc protrusion with mild narrowing of lateral recesses bilaterally. Mild ligamentum flavum hypertrophy. Moderate facet joint arthropathy. No significant neural foraminal stenosis.   L4-L5: Moderate circumferential disc protrusion and ligamentum flavum hypertrophy. Mild narrowing of lateral recesses bilaterally. Moderate facet joint arthropathy. No significant neural foraminal stenosis.   L5-S1: Disc height loss and broad-base disc protrusion. Narrowing of bilateral lateral recesses. Moderate facet joint hypertrophy. Mild bilateral neural foraminal stenosis, right worse than the left.   IMPRESSION: 1. Compression deformity of T12 vertebral body with approximately 50% vertebral body height loss, likely a chronic process.   2. Multilevel degenerate disc disease most prominent at L4-L5 and L5-S1 with mild bilateral neural foraminal stenosis at L5-S1.     Electronically Signed   By: Imran  Ahmed D.O.   On: 01/05/2022 09:00  PMFS History: Patient Active Problem List   Diagnosis Date Noted   Aortic atherosclerosis (HCC)    Hyperlipidemia LDL goal <70 07/28/2023   Statin myopathy 01/19/2023   Coronary artery disease involving native heart without angina pectoris 01/19/2023   Agatston coronary artery calcium  score greater than 400 01/19/2023   BPPV (benign paroxysmal positional vertigo), right 07/11/2021   Fibroma of intraoral region 01/14/2021   Otalgia, left ear 01/14/2021   Temporomandibular joint dysfunction 01/14/2021   Malocclusion 09/02/2020   Anxiety 11/15/2018   Insomnia 11/15/2018   Chronic diastolic heart failure (HCC) 04/18/2018   Benign essential HTN 04/18/2018   Congestive heart failure (HCC) 03/23/2018   Mild dilation of ascending aorta (HCC)     Elevated blood pressure reading without diagnosis of hypertension    Abnormal EKG 02/07/2016   Edema of extremities 02/07/2016   Joint pain 03/19/2014   Paroxysmal supraventricular tachycardia (HCC) 12/22/2013   Breast cancer of upper-outer quadrant of left female breast (HCC) 09/19/2013   Vaginal atrophy 08/18/2013   Vaginitis and vulvovaginitis 08/18/2013   Hypothyroidism, postsurgical 04/03/2013   Past Medical History:  Diagnosis Date   Agatston coronary artery calcium  score greater than 400    Ca score 860 with normal Stress PET CT 08/2023   Anxiety    Aortic atherosclerosis (HCC)    Arthritis    lower back and thumbs   Breast cancer of upper-outer quadrant of left female breast (HCC) 09/19/2013   Cancer (HCC)    left breast-(12-12-09 radiation and surgery)-Dr. Charolett Copes -oncology   Chronic diastolic CHF (congestive heart failure) (HCC)    Colon polyps    sm aden p 10/06 (difficult exam, needs propofol );neg 07/2010  Dyslipidemia    Edema extremities 02/07/2016   GERD (gastroesophageal reflux disease)    [facility]), neg for metaplasia '97, '08 (mod HH) 9.2011   HTN (hypertension)    Hypothyroidism, postsurgical 04/03/2013   Laryngitis 12/20/2012   recent episode is resolving with Amoxicillin orally   Mild dilation of ascending aorta (HCC)    Obesity    Osteoporosis    Paroxysmal supraventricular tachycardia (HCC) 12/22/2013   Personal history of radiation therapy    2011   PONV (postoperative nausea and vomiting)    Thyroid  adenoma 12/20/2012   surgery planned 12-30-12   Vaginal atrophy 08/18/2013   Vaginitis and vulvovaginitis 08/18/2013    Family History  Problem Relation Age of Onset   Stroke Mother    Heart disease Father    Cancer Brother        melanoma    Past Surgical History:  Procedure Laterality Date   BREAST LUMPECTOMY Left    2011   BREAST SURGERY     left breast lumpectomy   EYE SURGERY  12-20-12   right eye retina tear repair    THYROIDECTOMY N/A 12/30/2012   Procedure: THYROIDECTOMY;  Surgeon: Keitha Pata, MD;  Location: [facility];  Service: General;  Laterality: N/A;   Social History   Occupational History   Not on file  Tobacco Use   Smoking status: Former    Current packs/day: 0.00    Types: Cigarettes    Quit date: 01/31/2010    Years since quitting: 14.0   Smokeless tobacco: Never  Vaping Use   Vaping status: Never Used  Substance and Sexual Activity   Alcohol  use: Yes    Comment: social- occ   Drug use: No   Sexual activity: Yes    Birth control/protection: Post-menopausal

## 2024-03-07 DIAGNOSIS — B0089 Other herpesviral infection: Secondary | ICD-10-CM | POA: Diagnosis not present

## 2024-03-20 ENCOUNTER — Ambulatory Visit: Attending: Cardiovascular Disease | Admitting: Pharmacist

## 2024-03-20 ENCOUNTER — Other Ambulatory Visit (HOSPITAL_COMMUNITY): Payer: Self-pay

## 2024-03-20 ENCOUNTER — Telehealth: Payer: Self-pay | Admitting: Pharmacy Technician

## 2024-03-20 VITALS — Wt 236.0 lb

## 2024-03-20 DIAGNOSIS — E6609 Other obesity due to excess calories: Secondary | ICD-10-CM | POA: Diagnosis not present

## 2024-03-20 DIAGNOSIS — E66812 Obesity, class 2: Secondary | ICD-10-CM

## 2024-03-20 DIAGNOSIS — G4733 Obstructive sleep apnea (adult) (pediatric): Secondary | ICD-10-CM

## 2024-03-20 DIAGNOSIS — Z6839 Body mass index (BMI) 39.0-39.9, adult: Secondary | ICD-10-CM | POA: Diagnosis not present

## 2024-03-20 DIAGNOSIS — E669 Obesity, unspecified: Secondary | ICD-10-CM | POA: Insufficient documentation

## 2024-03-20 NOTE — Progress Notes (Signed)
 Patient ID: Maridel Pixler Der Goot                 DOB: [birth year]                    MRN: [id number]     HPI: Elenie Coven Der Monita Anon is a 68 y.o. female patient referred to pharmacy clinic by Dr. Micael Adas to initiate GLP1-RA therapy. PMH is significant for PSVT, mildly dilated ascending aorta, thyroid  adenoma, remote breast CA, former tobacco abuse, dyslipidemia (followed by PCP), GERD, recent mild HTN, OSA (AHI 23) and obesity. Most recent BMI 39.  Patient presents today to Pharm.D. clinic.  She reports that she has been inconsistent with water aerobics since November due to foot pain.  Just recently found out that she has arthritis in her little toes.  Has been taking Tylenol  and a small dose of ibuprofen to manage this.  Feels as though pain is better controlled which is allowing her to do more exercise.  She has lost about 10 pounds on her own.  Reports post dinner snacking being the hardest thing for her to avoid.  Admits she thinks she needs to eat more protein.  She does not like to cook.  She will either get takeout, generally a salad but uses her own salad dressing.  Or her husband will cook when he is home.  Wants to lose weight and be healthier and be able to travel when her husband retires soon.  Baseline weight and BMI: 236lb, 39.27   Diet: more chicken, less red meat, little fried foods, doesn't like to cook Breads and sweets are downfall Breakfast: special K w/ fruit and sugar free almond milk, coffee, unsweetened juice Lunch: salad- lettuce, tomatoes, cheese and croutons, simply vinegrette- sunflower seeds, nuts  maybe Half sandwich Dinner: salad w/ pulled chicken, hamburger w/ coleslaw and french fries, strawberries, chicken breast, sugar free BBQ spare ribs, baked potato, chicken salad   Exercise: water aerobics, gardening  Social History: quit smoking 14 years ago   Labs: No results found for: "HGBA1C"  Wt Readings from Last 1 Encounters:  01/26/24 234 lb  (106.1 kg)    BP Readings from Last 1 Encounters:  01/26/24 (!) 144/70   Pulse Readings from Last 1 Encounters:  01/26/24 (!) 55       Component Value Date/Time   CHOL 159 01/19/2024 0840   TRIG 77 01/19/2024 0840   HDL 77 01/19/2024 0840   CHOLHDL 2.1 01/19/2024 0840   LDLCALC 67 01/19/2024 0840    Past Medical History:  Diagnosis Date   Agatston coronary artery calcium  score greater than 400    Ca score 860 with normal Stress PET CT 08/2023   Anxiety    Aortic atherosclerosis (HCC)    Arthritis    lower back and thumbs   Breast cancer of upper-outer quadrant of left female breast (HCC) 09/19/2013   Cancer (HCC)    left breast-(12-12-09 radiation and surgery)-Dr. Charolett Copes -oncology   Chronic diastolic CHF (congestive heart failure) (HCC)    Colon polyps    sm aden p 10/06 (difficult exam, needs propofol );neg 07/2010   Dyslipidemia    Edema extremities 02/07/2016   GERD (gastroesophageal reflux disease)    [facility]), neg for metaplasia '97, '08 (mod HH) 9.2011   HTN (hypertension)    Hypothyroidism, postsurgical 04/03/2013   Laryngitis 12/20/2012   recent episode is resolving with Amoxicillin orally   Mild dilation of ascending aorta (HCC)  Obesity    Osteoporosis    Paroxysmal supraventricular tachycardia (HCC) 12/22/2013   Personal history of radiation therapy    2011   PONV (postoperative nausea and vomiting)    Thyroid  adenoma 12/20/2012   surgery planned 12-30-12   Vaginal atrophy 08/18/2013   Vaginitis and vulvovaginitis 08/18/2013    Current Outpatient Medications on File Prior to Visit  Medication Sig Dispense Refill   alendronate (FOSAMAX) 70 MG tablet Take 70 mg by mouth once a week.     ALPRAZolam  (XANAX ) 1 MG tablet Take 1 mg by mouth at bedtime as needed for sleep.     aspirin EC 81 MG tablet Take 81 mg by mouth every other day. Sundays and Thursdays     betamethasone dipropionate 0.05 % cream Apply topically.     Cetirizine HCl (ZYRTEC  ALLERGY) 10 MG CAPS Zyrtec 10 mg capsule Take by oral route.     clobetasol (OLUX) 0.05 % topical foam      CRANBERRY PO Take 500 mg by mouth daily.     cycloSPORINE (RESTASIS) 0.05 % ophthalmic emulsion 1  into affected eye Ophthalmic Twice a day     diltiazem  (CARDIZEM  CD) 120 MG 24 hr capsule TAKE 1 CAPSULE BY MOUTH ONCE DAILY IN THE MORNING . 90 capsule 3   empagliflozin  (JARDIANCE ) 10 MG TABS tablet Take 1 tablet (10 mg total) by mouth every morning. 90 tablet 3   Evolocumab  (REPATHA  SURECLICK) 140 MG/ML SOAJ Inject 140 mg into the skin every 14 (fourteen) days. 6 mL 3   ezetimibe  (ZETIA ) 10 MG tablet Take 1 tablet (10 mg total) by mouth daily. 90 tablet 3   Fluocinolone Acetonide Scalp 0.01 % OIL      fluticasone (CUTIVATE) 0.05 % cream Apply topically 2 (two) times daily as needed.     furosemide  (LASIX ) 20 MG tablet Take 1 tablet (20 mg total) by mouth daily. 90 tablet 3   halobetasol (ULTRAVATE) 0.05 % cream halobetasol propionate 0.05 % topical cream APPLY TO AFFECTED AREA UP TO TWICE A DAY AS NEEDED *NOT TO FACE, GROIN, OR UNDERARMS*     hydrOXYzine (ATARAX) 25 MG tablet 1 tablet as needed Orally at night for 30 days     hydrOXYzine (VISTARIL) 25 MG capsule Take 1 capsule by mouth daily as needed. itching     meclizine (ANTIVERT) 25 MG tablet TAKE 1 TABLET BY MOUTH EVERY 8 HOURS AS NEEDED FOR 14 DAYS FOR VERTIGO OR SPINNING     Multiple Vitamin (MULTIVITAMIN ADULT PO) Take 1 Dose by mouth daily. 2 gummies daily     Omega 3 1000 MG CAPS Take 2,000 mg by mouth daily.     pantoprazole  (PROTONIX ) 40 MG tablet Take 1 tablet (40 mg total) daily by mouth. 90 tablet 3   sertraline  (ZOLOFT ) 100 MG tablet Take 150 mg by mouth daily.     SYNTHROID  112 MCG tablet Take 224 mcg by mouth every morning.     No current facility-administered medications on file prior to visit.    Allergies  Allergen Reactions   Crestor  [Rosuvastatin ]     Cramping on 5mg  2x/week   Pravastatin  Sodium Other (See  Comments)    myalgias   Sulfa Antibiotics Hives   Sulfa Drugs Cross Reactors Hives   Venlafaxine      Assessment/Plan:  1. Weight loss - Patient has not met goal of at least 5% of body weight loss with comprehensive lifestyle modifications alone in the past 3-6 months. Pharmacotherapy  is appropriate to pursue as augmentation. Will start Zepbound 2.5 mg weekly. Confirmed patient has no personal or family history of medullary thyroid  carcinoma (MTC) or Multiple Endocrine Neoplasia syndrome type 2 (MEN 2).  No history of pancreatitis or gallstones injection technique reviewed at today's visit.  Advised patient on common side effects including nausea, diarrhea, dyspepsia, decreased appetite, and fatigue. Counseled patient on reducing meal size and how to titrate medication to minimize side effects. Counseled patient to call if intolerable side effects or if experiencing dehydration, abdominal pain, or dizziness. Patient will adhere to dietary modifications and will target at least 150 minutes of moderate intensity exercise weekly.   Follow up in 1 month via telephone for tolerability update and dose titration.

## 2024-03-20 NOTE — Patient Instructions (Signed)

## 2024-03-20 NOTE — Telephone Encounter (Signed)
 Pharmacy Patient Advocate Encounter   Received notification from Pt Calls Messages-melissa that prior authorization for zepbound is required/requested.   Insurance verification completed.   The patient is insured through [facility] AD[employer] .   Per test claim: PA required; PA submitted to above mentioned insurance via CoverMyMeds Key/confirmation #/EOC [id number] Status is pending

## 2024-03-20 NOTE — Telephone Encounter (Signed)
 She is concerned that upon waking in the morning her pace is extremely pale

## 2024-03-21 ENCOUNTER — Other Ambulatory Visit (HOSPITAL_COMMUNITY): Payer: Self-pay

## 2024-03-21 NOTE — Telephone Encounter (Signed)
 Pt made aware of Dr. Micael Adas response.

## 2024-03-21 NOTE — Telephone Encounter (Signed)
   Modafinil 100mg  or 200mg  and armodafinil 50mg , 150mg , 200mg , 250mg  are on the formulary per [employer]. They would still require a prior authorization but he said those are the only formulary ones on this plan.

## 2024-03-28 NOTE — Telephone Encounter (Signed)
 Appeals letter faxed.

## 2024-03-31 ENCOUNTER — Encounter: Payer: Self-pay | Admitting: Pharmacist

## 2024-04-03 DIAGNOSIS — Z1212 Encounter for screening for malignant neoplasm of rectum: Secondary | ICD-10-CM | POA: Diagnosis not present

## 2024-04-03 DIAGNOSIS — E039 Hypothyroidism, unspecified: Secondary | ICD-10-CM | POA: Diagnosis not present

## 2024-04-03 DIAGNOSIS — E7800 Pure hypercholesterolemia, unspecified: Secondary | ICD-10-CM | POA: Diagnosis not present

## 2024-04-03 DIAGNOSIS — I519 Heart disease, unspecified: Secondary | ICD-10-CM | POA: Diagnosis not present

## 2024-04-03 DIAGNOSIS — M810 Age-related osteoporosis without current pathological fracture: Secondary | ICD-10-CM | POA: Diagnosis not present

## 2024-04-03 DIAGNOSIS — Z79899 Other long term (current) drug therapy: Secondary | ICD-10-CM | POA: Diagnosis not present

## 2024-04-07 DIAGNOSIS — I471 Supraventricular tachycardia, unspecified: Secondary | ICD-10-CM | POA: Diagnosis not present

## 2024-04-07 DIAGNOSIS — Z1212 Encounter for screening for malignant neoplasm of rectum: Secondary | ICD-10-CM | POA: Diagnosis not present

## 2024-04-07 DIAGNOSIS — I2584 Coronary atherosclerosis due to calcified coronary lesion: Secondary | ICD-10-CM | POA: Diagnosis not present

## 2024-04-07 DIAGNOSIS — Z6839 Body mass index (BMI) 39.0-39.9, adult: Secondary | ICD-10-CM | POA: Diagnosis not present

## 2024-04-07 DIAGNOSIS — Z1331 Encounter for screening for depression: Secondary | ICD-10-CM | POA: Diagnosis not present

## 2024-04-07 DIAGNOSIS — R82998 Other abnormal findings in urine: Secondary | ICD-10-CM | POA: Diagnosis not present

## 2024-04-07 DIAGNOSIS — E7800 Pure hypercholesterolemia, unspecified: Secondary | ICD-10-CM | POA: Diagnosis not present

## 2024-04-07 DIAGNOSIS — I77810 Thoracic aortic ectasia: Secondary | ICD-10-CM | POA: Diagnosis not present

## 2024-04-07 DIAGNOSIS — I2510 Atherosclerotic heart disease of native coronary artery without angina pectoris: Secondary | ICD-10-CM | POA: Diagnosis not present

## 2024-04-07 DIAGNOSIS — Z0000 Encounter for general adult medical examination without abnormal findings: Secondary | ICD-10-CM | POA: Diagnosis not present

## 2024-04-07 DIAGNOSIS — F419 Anxiety disorder, unspecified: Secondary | ICD-10-CM | POA: Diagnosis not present

## 2024-04-07 DIAGNOSIS — E041 Nontoxic single thyroid nodule: Secondary | ICD-10-CM | POA: Diagnosis not present

## 2024-04-07 DIAGNOSIS — C50912 Malignant neoplasm of unspecified site of left female breast: Secondary | ICD-10-CM | POA: Diagnosis not present

## 2024-04-07 DIAGNOSIS — Z1339 Encounter for screening examination for other mental health and behavioral disorders: Secondary | ICD-10-CM | POA: Diagnosis not present

## 2024-04-07 DIAGNOSIS — E039 Hypothyroidism, unspecified: Secondary | ICD-10-CM | POA: Diagnosis not present

## 2024-04-07 DIAGNOSIS — M810 Age-related osteoporosis without current pathological fracture: Secondary | ICD-10-CM | POA: Diagnosis not present

## 2024-04-07 DIAGNOSIS — G4733 Obstructive sleep apnea (adult) (pediatric): Secondary | ICD-10-CM | POA: Diagnosis not present

## 2024-04-10 MED ORDER — ZEPBOUND 2.5 MG/0.5ML ~~LOC~~ SOLN: 2.5000 mg | SUBCUTANEOUS | 0 refills | Status: DC

## 2024-04-20 DIAGNOSIS — G4733 Obstructive sleep apnea (adult) (pediatric): Secondary | ICD-10-CM | POA: Diagnosis not present

## 2024-04-24 DIAGNOSIS — G4733 Obstructive sleep apnea (adult) (pediatric): Secondary | ICD-10-CM | POA: Diagnosis not present

## 2024-04-26 ENCOUNTER — Other Ambulatory Visit: Payer: Self-pay | Admitting: Cardiology

## 2024-05-01 ENCOUNTER — Other Ambulatory Visit: Payer: Self-pay | Admitting: Nurse Practitioner

## 2024-05-01 DIAGNOSIS — R234 Changes in skin texture: Secondary | ICD-10-CM | POA: Diagnosis not present

## 2024-05-01 DIAGNOSIS — Z853 Personal history of malignant neoplasm of breast: Secondary | ICD-10-CM | POA: Diagnosis not present

## 2024-05-03 ENCOUNTER — Encounter: Payer: Self-pay | Admitting: Pharmacist

## 2024-05-10 ENCOUNTER — Ambulatory Visit
Admission: RE | Admit: 2024-05-10 | Discharge: 2024-05-10 | Disposition: A | Discharge status: Home / Self Care | Source: Ambulatory Visit | Attending: Nurse Practitioner

## 2024-05-10 DIAGNOSIS — Z853 Personal history of malignant neoplasm of breast: Secondary | ICD-10-CM | POA: Diagnosis not present

## 2024-05-10 DIAGNOSIS — R234 Changes in skin texture: Secondary | ICD-10-CM

## 2024-05-10 DIAGNOSIS — N6459 Other signs and symptoms in breast: Secondary | ICD-10-CM | POA: Diagnosis not present

## 2024-05-18 ENCOUNTER — Other Ambulatory Visit: Payer: Self-pay | Admitting: Cardiology

## 2024-05-20 DIAGNOSIS — G4733 Obstructive sleep apnea (adult) (pediatric): Secondary | ICD-10-CM | POA: Diagnosis not present

## 2024-05-24 ENCOUNTER — Ambulatory Visit: Admitting: Orthopedic Surgery

## 2024-05-24 ENCOUNTER — Other Ambulatory Visit: Payer: Self-pay

## 2024-05-24 DIAGNOSIS — M25551 Pain in right hip: Secondary | ICD-10-CM

## 2024-05-24 DIAGNOSIS — M79604 Pain in right leg: Secondary | ICD-10-CM | POA: Diagnosis not present

## 2024-05-25 ENCOUNTER — Encounter: Payer: Self-pay | Admitting: Orthopedic Surgery

## 2024-05-25 MED ORDER — LIDOCAINE HCL 1 % IJ SOLN
5.0000 mL | INTRAMUSCULAR | Status: AC | PRN
  Administered 2024-05-24: 5 mL

## 2024-05-25 MED ORDER — TRIAMCINOLONE ACETONIDE 40 MG/ML IJ SUSP
40.0000 mg | INTRAMUSCULAR | Status: AC | PRN
  Administered 2024-05-24: 40 mg via INTRA_ARTICULAR

## 2024-05-25 MED ORDER — BUPIVACAINE HCL 0.25 % IJ SOLN
4.0000 mL | INTRAMUSCULAR | Status: AC | PRN
  Administered 2024-05-24: 4 mL via INTRA_ARTICULAR

## 2024-05-25 NOTE — Progress Notes (Signed)
 Office Visit Note   Patient: [name]           Date of Birth: [birth year]           MRN: [id number] Visit Date: 05/24/2024 Requested by: Vernadine Charlie ORN, MD [address] [place],  [place] [address] PCP: Tisovec, Charlie ORN, MD  Subjective: Chief Complaint  Patient presents with   Right Leg - Pain    HPI: [name] is a 67 y.o. female who presents to the office reporting right hip and leg pain.  On and off for the last 3 weeks.  Does wake sleep at night.  Tender to touch in the trochanteric region.  Does radiate into the thigh but really not in the groin and low back pain.  Last week she complained of difficulty being stuck on the toilet.  She reports some decreased in strength on the right-hand side.  Denies ibuprofen has helped her but she has gastroesophageal reflux disease.  Walking is okay.  She does have a history of remote T12 compression fracture.  Patient also describes having had several epidural steroid injections with Dr. Eldonna but the last one was a year and a half ago.  She does do water aerobics 3 times a week.  Takes Tylenol  arthritis which is not very helpful.  Does use CPAP..                ROS: All systems reviewed are negative as they relate to the chief complaint within the history of present illness.  Patient denies fevers or chills.  Assessment & Plan: Visit Diagnoses:  1. Greater trochanteric pain syndrome of right lower extremity   2. Pain in right leg     Plan: Impression is trochanteric bursitis on the right.  Injection performed today under ultrasound guidance.  I think she may be having some recurrent nerve compression from her lumbar spine.  Would like for her to see Dr. Eldonna for lumbar spine ESI as well.  Iliotibial band stretching exercises encouraged.  Will see her back as needed.  Follow-Up Instructions: No follow-ups on file.   Orders:  Orders Placed This Encounter  Procedures   US  Guided Needle  Placement - No Linked Charges   Ambulatory referral to Physical Medicine Rehab   No orders of the defined types were placed in this encounter.     Procedures: Large Joint Inj: R greater trochanter on 05/24/2024 2:23 PM Indications: pain and diagnostic evaluation Details: 22 G 3.5 in needle, ultrasound-guided lateral approach  Arthrogram: No  Medications: 5 mL lidocaine  1 %; 4 mL bupivacaine  0.25 %; 40 mg triamcinolone  acetonide 40 MG/ML Outcome: tolerated well, no immediate complications Procedure, treatment alternatives, risks and benefits explained, specific risks discussed. Consent was given by the patient. Immediately prior to procedure a time out was called to verify the correct patient, procedure, equipment, support staff and site/side marked as required. Patient was prepped and draped in the usual sterile fashion.       Clinical Data: No additional findings.  Objective: Vital Signs: There were no vitals taken for this visit.  Physical Exam:  Constitutional: Patient appears well-developed HEENT:  Head: Normocephalic Eyes:EOM are normal Neck: Normal range of motion Cardiovascular: Normal rate Pulmonary/chest: Effort normal Neurologic: Patient is alert Skin: Skin is warm Psychiatric: Patient has normal mood and affect  Ortho Exam: Ortho exam demonstrates no nerve root tension signs.  No groin pain with internal or external rotation of the leg.  No masses of the neck right hip region.  Does have tenderness to trochanteric palpation on the right compared to left.  5 out of 5 ankle dorsiflexion plantarflexion quad hamstring strength.  Leg lengths equal.  No real pain with forward or lateral bending.  No paresthesias L1-S1 bilaterally  Specialty Comments:  MRI LUMBAR SPINE WITHOUT CONTRAST   TECHNIQUE: Multiplanar, multisequence MR imaging of the lumbar spine was performed. No intravenous contrast was administered.   COMPARISON:  Radiographs dated December 31, 2021    FINDINGS: Segmentation:  Standard.   Alignment: Mild acute kyphotic angulation at T11-T12 due to compression fracture of T12. Mild anterolisthesis of L4.   Vertebrae: Compression deformity of T12 vertebral body with approximately 50% vertebral body height loss, without evidence of edema likely a chronic process.   Conus medullaris and cauda equina: Conus extends to the L1-L2 level. Conus and cauda equina appear normal.   Paraspinal and other soft tissues: Nonspecific subcutaneous soft tissue edema.   Disc spaces:   T11-T12: Disc height loss with disc protrusion into the central canal. Mild central canal narrowing. No significant neural foraminal narrowing. Mild facet joint arthropathy.   T12-L1: No significant disc bulge. No neural foraminal stenosis. No central canal stenosis.   L1-L2: No significant disc bulge. No neural foraminal stenosis. No central canal stenosis.   L2-L3: No significant disc bulge. No neural foraminal stenosis. No central canal stenosis. Bilateral facet joint arthropathy, left worse than the right.   L3-L4: Mild disc protrusion with mild narrowing of lateral recesses bilaterally. Mild ligamentum flavum hypertrophy. Moderate facet joint arthropathy. No significant neural foraminal stenosis.   L4-L5: Moderate circumferential disc protrusion and ligamentum flavum hypertrophy. Mild narrowing of lateral recesses bilaterally. Moderate facet joint arthropathy. No significant neural foraminal stenosis.   L5-S1: Disc height loss and broad-base disc protrusion. Narrowing of bilateral lateral recesses. Moderate facet joint hypertrophy. Mild bilateral neural foraminal stenosis, right worse than the left.   IMPRESSION: 1. Compression deformity of T12 vertebral body with approximately 50% vertebral body height loss, likely a chronic process.   2. Multilevel degenerate disc disease most prominent at L4-L5 and L5-S1 with mild bilateral neural foraminal  stenosis at L5-S1.     Electronically Signed   By: Imran  Ahmed D.O.   On: 01/05/2022 09:00  Imaging: No results found.   PMFS History: Patient Active Problem List   Diagnosis Date Noted   OSA (obstructive sleep apnea) 03/20/2024   Obesity 03/20/2024   Aortic atherosclerosis (HCC)    Hyperlipidemia LDL goal <70 07/28/2023   Statin myopathy 01/19/2023   Coronary artery disease involving native heart without angina pectoris 01/19/2023   Agatston coronary artery calcium  score greater than 400 01/19/2023   BPPV (benign paroxysmal positional vertigo), right 07/11/2021   Fibroma of intraoral region 01/14/2021   Otalgia, left ear 01/14/2021   Temporomandibular joint dysfunction 01/14/2021   Malocclusion 09/02/2020   Anxiety 11/15/2018   Insomnia 11/15/2018   Chronic diastolic heart failure (HCC) 04/18/2018   Benign essential HTN 04/18/2018   Congestive heart failure (HCC) 03/23/2018   Mild dilation of ascending aorta (HCC)    Elevated blood pressure reading without diagnosis of hypertension    Abnormal EKG 02/07/2016   Edema of extremities 02/07/2016   Joint pain 03/19/2014   Paroxysmal supraventricular tachycardia (HCC) 12/22/2013   Breast cancer of upper-outer quadrant of left female breast (HCC) 09/19/2013   Vaginal atrophy 08/18/2013   Vaginitis and vulvovaginitis 08/18/2013   Hypothyroidism, postsurgical 04/03/2013   Past Medical  History:  Diagnosis Date   Agatston coronary artery calcium  score greater than 400    Ca score 860 with normal Stress PET CT 08/2023   Anxiety    Aortic atherosclerosis (HCC)    Arthritis    lower back and thumbs   Breast cancer of upper-outer quadrant of left female breast (HCC) 09/19/2013   Cancer (HCC)    left breast-(12-12-09 radiation and surgery)-Dr. Layla -oncology   Chronic diastolic CHF (congestive heart failure) (HCC)    Colon polyps    sm aden p 10/06 (difficult exam, needs propofol );neg 07/2010   Dyslipidemia    Edema  extremities 02/07/2016   GERD (gastroesophageal reflux disease)    [facility]), neg for metaplasia '97, '08 (mod HH) 9.2011   HTN (hypertension)    Hypothyroidism, postsurgical 04/03/2013   Laryngitis 12/20/2012   recent episode is resolving with Amoxicillin orally   Mild dilation of ascending aorta (HCC)    Obesity    Osteoporosis    Paroxysmal supraventricular tachycardia (HCC) 12/22/2013   Personal history of radiation therapy    2011   PONV (postoperative nausea and vomiting)    Thyroid  adenoma 12/20/2012   surgery planned 12-30-12   Vaginal atrophy 08/18/2013   Vaginitis and vulvovaginitis 08/18/2013    Family History  Problem Relation Age of Onset   Stroke Mother    Heart disease Father    Cancer Brother        melanoma    Past Surgical History:  Procedure Laterality Date   BREAST LUMPECTOMY Left    2011   BREAST SURGERY     left breast lumpectomy   EYE SURGERY  12-20-12   right eye retina tear repair   THYROIDECTOMY N/A 12/30/2012   Procedure: THYROIDECTOMY;  Surgeon: Krystal CHRISTELLA Spinner, MD;  Location: [facility];  Service: General;  Laterality: N/A;   Social History   Occupational History   Not on file  Tobacco Use   Smoking status: Former    Current packs/day: 0.00    Types: Cigarettes    Quit date: 01/31/2010    Years since quitting: 14.3   Smokeless tobacco: Never  Vaping Use   Vaping status: Never Used  Substance and Sexual Activity   Alcohol  use: Yes    Comment: social- occ   Drug use: No   Sexual activity: Yes    Birth control/protection: Post-menopausal

## 2024-05-29 DIAGNOSIS — G4733 Obstructive sleep apnea (adult) (pediatric): Secondary | ICD-10-CM | POA: Diagnosis not present

## 2024-06-02 ENCOUNTER — Encounter: Payer: Self-pay | Admitting: Physical Medicine and Rehabilitation

## 2024-06-02 ENCOUNTER — Ambulatory Visit: Admitting: Physical Medicine and Rehabilitation

## 2024-06-02 DIAGNOSIS — M545 Low back pain, unspecified: Secondary | ICD-10-CM | POA: Diagnosis not present

## 2024-06-02 DIAGNOSIS — M7918 Myalgia, other site: Secondary | ICD-10-CM

## 2024-06-02 DIAGNOSIS — M47816 Spondylosis without myelopathy or radiculopathy, lumbar region: Secondary | ICD-10-CM | POA: Diagnosis not present

## 2024-06-02 DIAGNOSIS — G8929 Other chronic pain: Secondary | ICD-10-CM

## 2024-06-02 NOTE — Progress Notes (Signed)
 [name] - 68 y.o. female MRN [id number]  Date of birth: 1956-05-25  Office Visit Note: Visit Date: 06/02/2024 PCP: Vernadine Charlie ORN, MD Referred by: Vernadine Charlie ORN, MD  Subjective: Chief Complaint  Patient presents with   Lower Back - Pain   HPI: [name] is a 68 y.o. female who comes in today for evaluation of acute right hip and leg pain. She reports history of chronic lower back/buttock pain that we have treated her for in the past. She was evaluated by Dr. Glendia Hutchinson last week. He performed right greater trochanter injection on 05/25/2023, reports significant relief of right hip/leg pain with this procedure. She is here to follow up with us  to discuss further treatment plan. She is pain free today, states she is not requiring over the counter medication at this time. Lumbar MRI imaging from 2023 shows multilevel degenerative disc disease most prominent at L4-L5 and L5-S1 with mild bilateral neural foraminal stenosis at L5-S1. No high grade spinal canal stenosis noted. She underwent bilateral L4-L5 and L5-S1 radiofrequency ablation in our office on 11/11/2022. She reports significant and sustained with this procedure, greater than 80% for over a year. Patient denies focal weakness, numbness and tingling. No recent trauma or falls.       Review of Systems  Musculoskeletal:  Negative for back pain.  Neurological:  Negative for tingling, sensory change, focal weakness and weakness.  All other systems reviewed and are negative.  Otherwise per HPI.  Assessment & Plan: Visit Diagnoses:    ICD-10-CM   1. Chronic bilateral low back pain without sciatica  M54.50    G89.29     2. Chronic buttock pain  M79.18    G89.29     3. Spondylosis without myelopathy or radiculopathy, lumbar region  M47.816     4. Facet hypertrophy of lumbar region  M47.816        Plan: Findings:  Acute right hip and leg pain. She feels much better post right greater  trochanteric injection with Dr. Hutchinson. We discussed further treatment should her lower back and buttock pain return. We would recommend repeating bilateral L4-L5 and L5-S1 radiofrequency ablation as needed. At this point, we will continue to monitor, I encouraged her to continue with home exercise regimen and remain active. She has no questions at this time. Her exam today is non focal, good strengthen noted to lower extremities. No red flag symptoms noted upon exam today.     Meds & Orders: No orders of the defined types were placed in this encounter.  No orders of the defined types were placed in this encounter.   Follow-up: Return if symptoms worsen or fail to improve.   Procedures: No procedures performed      Clinical History: MRI LUMBAR SPINE WITHOUT CONTRAST   TECHNIQUE: Multiplanar, multisequence MR imaging of the lumbar spine was performed. No intravenous contrast was administered.   COMPARISON:  Radiographs dated December 31, 2021   FINDINGS: Segmentation:  Standard.   Alignment: Mild acute kyphotic angulation at T11-T12 due to compression fracture of T12. Mild anterolisthesis of L4.   Vertebrae: Compression deformity of T12 vertebral body with approximately 50% vertebral body height loss, without evidence of edema likely a chronic process.   Conus medullaris and cauda equina: Conus extends to the L1-L2 level. Conus and cauda equina appear normal.   Paraspinal and other soft tissues: Nonspecific subcutaneous soft tissue edema.   Disc spaces:   T11-T12: Disc  height loss with disc protrusion into the central canal. Mild central canal narrowing. No significant neural foraminal narrowing. Mild facet joint arthropathy.   T12-L1: No significant disc bulge. No neural foraminal stenosis. No central canal stenosis.   L1-L2: No significant disc bulge. No neural foraminal stenosis. No central canal stenosis.   L2-L3: No significant disc bulge. No neural foraminal stenosis.  No central canal stenosis. Bilateral facet joint arthropathy, left worse than the right.   L3-L4: Mild disc protrusion with mild narrowing of lateral recesses bilaterally. Mild ligamentum flavum hypertrophy. Moderate facet joint arthropathy. No significant neural foraminal stenosis.   L4-L5: Moderate circumferential disc protrusion and ligamentum flavum hypertrophy. Mild narrowing of lateral recesses bilaterally. Moderate facet joint arthropathy. No significant neural foraminal stenosis.   L5-S1: Disc height loss and broad-base disc protrusion. Narrowing of bilateral lateral recesses. Moderate facet joint hypertrophy. Mild bilateral neural foraminal stenosis, right worse than the left.   IMPRESSION: 1. Compression deformity of T12 vertebral body with approximately 50% vertebral body height loss, likely a chronic process.   2. Multilevel degenerate disc disease most prominent at L4-L5 and L5-S1 with mild bilateral neural foraminal stenosis at L5-S1.     Electronically Signed   By: Imran  Ahmed D.O.   On: 01/05/2022 09:00   She reports that she quit smoking about 14 years ago. Her smoking use included cigarettes. She has never used smokeless tobacco. No results for input(s): HGBA1C, LABURIC in the last 8760 hours.  Objective:  VS:  HT:    WT:   BMI:     BP:   HR: bpm  TEMP: ( )  RESP:  Physical Exam Vitals and nursing note reviewed.  HENT:     Head: Normocephalic and atraumatic.     Right Ear: External ear normal.     Left Ear: External ear normal.     Nose: Nose normal.     Mouth/Throat:     Mouth: Mucous membranes are moist.  Eyes:     Extraocular Movements: Extraocular movements intact.  Cardiovascular:     Rate and Rhythm: Normal rate.     Pulses: Normal pulses.  Pulmonary:     Effort: Pulmonary effort is normal.  Abdominal:     General: Abdomen is flat. There is no distension.  Musculoskeletal:        General: Normal range of motion.     Cervical back:  Normal range of motion.     Comments: Patient rises from seated position to standing without difficulty. Good lumbar range of motion. No pain noted with facet loading. 5/5 strength noted with bilateral hip flexion, knee flexion/extension, ankle dorsiflexion/plantarflexion and EHL. No clonus noted bilaterally. No pain upon palpation of greater trochanters. No pain with internal/external rotation of bilateral hips. Sensation intact bilaterally. Negative slump test bilaterally. Ambulates without aid, gait steady.     Skin:    General: Skin is warm and dry.     Capillary Refill: Capillary refill takes less than 2 seconds.  Neurological:     General: No focal deficit present.     Mental Status: She is alert and oriented to person, place, and time.  Psychiatric:        Mood and Affect: Mood normal.        Behavior: Behavior normal.     Ortho Exam  Imaging: No results found.  Past Medical/Family/Surgical/Social History: Medications & Allergies reviewed per EMR, new medications updated. Patient Active Problem List   Diagnosis Date Noted   OSA (obstructive  sleep apnea) 03/20/2024   Obesity 03/20/2024   Aortic atherosclerosis (HCC)    Hyperlipidemia LDL goal <70 07/28/2023   Statin myopathy 01/19/2023   Coronary artery disease involving native heart without angina pectoris 01/19/2023   Agatston coronary artery calcium  score greater than 400 01/19/2023   BPPV (benign paroxysmal positional vertigo), right 07/11/2021   Fibroma of intraoral region 01/14/2021   Otalgia, left ear 01/14/2021   Temporomandibular joint dysfunction 01/14/2021   Malocclusion 09/02/2020   Anxiety 11/15/2018   Insomnia 11/15/2018   Chronic diastolic heart failure (HCC) 04/18/2018   Benign essential HTN 04/18/2018   Congestive heart failure (HCC) 03/23/2018   Mild dilation of ascending aorta (HCC)    Elevated blood pressure reading without diagnosis of hypertension    Abnormal EKG 02/07/2016   Edema of extremities  02/07/2016   Joint pain 03/19/2014   Paroxysmal supraventricular tachycardia (HCC) 12/22/2013   Breast cancer of upper-outer quadrant of left female breast (HCC) 09/19/2013   Vaginal atrophy 08/18/2013   Vaginitis and vulvovaginitis 08/18/2013   Hypothyroidism, postsurgical 04/03/2013   Past Medical History:  Diagnosis Date   Agatston coronary artery calcium  score greater than 400    Ca score 860 with normal Stress PET CT 08/2023   Anxiety    Aortic atherosclerosis (HCC)    Arthritis    lower back and thumbs   Breast cancer of upper-outer quadrant of left female breast (HCC) 09/19/2013   Cancer (HCC)    left breast-(12-12-09 radiation and surgery)-Dr. Layla -oncology   Chronic diastolic CHF (congestive heart failure) (HCC)    Colon polyps    sm aden p 10/06 (difficult exam, needs propofol );neg 07/2010   Dyslipidemia    Edema extremities 02/07/2016   GERD (gastroesophageal reflux disease)    [facility]), neg for metaplasia '97, '08 (mod HH) 9.2011   HTN (hypertension)    Hypothyroidism, postsurgical 04/03/2013   Laryngitis 12/20/2012   recent episode is resolving with Amoxicillin orally   Mild dilation of ascending aorta (HCC)    Obesity    Osteoporosis    Paroxysmal supraventricular tachycardia (HCC) 12/22/2013   Personal history of radiation therapy    2011   PONV (postoperative nausea and vomiting)    Thyroid  adenoma 12/20/2012   surgery planned 12-30-12   Vaginal atrophy 08/18/2013   Vaginitis and vulvovaginitis 08/18/2013   Family History  Problem Relation Age of Onset   Stroke Mother    Heart disease Father    Cancer Brother        melanoma   Past Surgical History:  Procedure Laterality Date   BREAST LUMPECTOMY Left    2011   BREAST SURGERY     left breast lumpectomy   EYE SURGERY  12-20-12   right eye retina tear repair   THYROIDECTOMY N/A 12/30/2012   Procedure: THYROIDECTOMY;  Surgeon: Krystal CHRISTELLA Spinner, MD;  Location: [facility];  Service: General;   Laterality: N/A;   Social History   Occupational History   Not on file  Tobacco Use   Smoking status: Former    Current packs/day: 0.00    Types: Cigarettes    Quit date: 01/31/2010    Years since quitting: 14.3   Smokeless tobacco: Never  Vaping Use   Vaping status: Never Used  Substance and Sexual Activity   Alcohol  use: Yes    Comment: social- occ   Drug use: No   Sexual activity: Yes    Birth control/protection: Post-menopausal

## 2024-06-02 NOTE — Progress Notes (Signed)
 Pain Scale   Average Pain 0 Patient advising she has buttocks pain at times and want to have it evaluated.        +Driver, -BT, -Dye Allergies.

## 2024-06-08 ENCOUNTER — Ambulatory Visit: Admitting: Orthopedic Surgery

## 2024-06-12 ENCOUNTER — Other Ambulatory Visit: Payer: Self-pay | Admitting: Cardiology

## 2024-06-19 NOTE — Telephone Encounter (Signed)
 Pt to reach out to me week of 8/25 for next dose determination

## 2024-06-20 MED ORDER — ZEPBOUND 5 MG/0.5ML ~~LOC~~ SOLN: 5.0000 mg | SUBCUTANEOUS | 0 refills | Status: DC

## 2024-06-20 NOTE — Addendum Note (Signed)
 Addended by: Alys Dulak L on: 06/20/2024 12:57 PM   Modules accepted: Orders

## 2024-07-14 ENCOUNTER — Other Ambulatory Visit: Payer: Self-pay | Admitting: Cardiology

## 2024-07-17 MED ORDER — ZEPBOUND 7.5 MG/0.5ML ~~LOC~~ SOLN: 7.5000 mg | SUBCUTANEOUS | 0 refills | Status: DC

## 2024-07-20 DIAGNOSIS — K219 Gastro-esophageal reflux disease without esophagitis: Secondary | ICD-10-CM | POA: Diagnosis not present

## 2024-07-31 ENCOUNTER — Other Ambulatory Visit (HOSPITAL_COMMUNITY): Payer: Self-pay

## 2024-07-31 DIAGNOSIS — G4733 Obstructive sleep apnea (adult) (pediatric): Secondary | ICD-10-CM | POA: Diagnosis not present

## 2024-08-01 ENCOUNTER — Other Ambulatory Visit: Payer: Self-pay | Admitting: Internal Medicine

## 2024-08-01 DIAGNOSIS — Z1231 Encounter for screening mammogram for malignant neoplasm of breast: Secondary | ICD-10-CM

## 2024-08-03 DIAGNOSIS — G4733 Obstructive sleep apnea (adult) (pediatric): Secondary | ICD-10-CM | POA: Diagnosis not present

## 2024-08-07 ENCOUNTER — Ambulatory Visit: Admitting: Orthopedic Surgery

## 2024-08-07 ENCOUNTER — Other Ambulatory Visit (INDEPENDENT_AMBULATORY_CARE_PROVIDER_SITE_OTHER): Payer: Self-pay

## 2024-08-07 ENCOUNTER — Other Ambulatory Visit: Payer: Self-pay

## 2024-08-07 DIAGNOSIS — M25552 Pain in left hip: Secondary | ICD-10-CM

## 2024-08-07 DIAGNOSIS — M7062 Trochanteric bursitis, left hip: Secondary | ICD-10-CM

## 2024-08-08 ENCOUNTER — Encounter: Payer: Self-pay | Admitting: Orthopedic Surgery

## 2024-08-08 MED ORDER — LIDOCAINE HCL 1 % IJ SOLN
5.0000 mL | INTRAMUSCULAR | Status: AC | PRN
  Administered 2024-08-07: 5 mL

## 2024-08-08 MED ORDER — TRIAMCINOLONE ACETONIDE 40 MG/ML IJ SUSP
40.0000 mg | INTRAMUSCULAR | Status: AC | PRN
  Administered 2024-08-07: 40 mg via INTRA_ARTICULAR

## 2024-08-08 MED ORDER — BUPIVACAINE HCL 0.25 % IJ SOLN
4.0000 mL | INTRAMUSCULAR | Status: AC | PRN
  Administered 2024-08-07: 4 mL via INTRA_ARTICULAR

## 2024-08-08 NOTE — Progress Notes (Signed)
 Office Visit Note   Patient: [name]           Date of Birth: [birth year]           MRN: [id number] Visit Date: 08/07/2024 Requested by: Vernadine Charlie ORN, MD [address] [place],  [place] [address] PCP: Tisovec, Charlie ORN, MD  Subjective: Chief Complaint  Patient presents with   Left Hip - Pain    HPI: [name] is a 68 y.o. female who presents to the office reporting left hip trochanteric pain for 1 month.  Denies any history of injury.  Describes pain getting up and down from a chair.  Stiff after sitting.  Wakes her from sleep.  Radiates to the thigh and knee but she denies any groin pain.  She does have a baseline amount of low back pain and tightness.  Did very well with her right hip trochanteric bursa injection on 05/24/2024.  She is also using topical without much relief..                ROS: All systems reviewed are negative as they relate to the chief complaint within the history of present illness.  Patient denies fevers or chills.  Assessment & Plan: Visit Diagnoses:  1. Pain in left hip     Plan: Impression is trochanteric bursitis on the left-hand side.  Ultrasound-guided trochanteric injection performed today.  She did well on the right-hand side.  Will see how she does on the left-hand side.  Follow-up with us  as needed.  Does not look like hip arthritis at this time.  Follow-Up Instructions: No follow-ups on file.   Orders:  Orders Placed This Encounter  Procedures   XR HIP UNILAT W OR W/O PELVIS 2-3 VIEWS LEFT   US  Guided Needle Placement - No Linked Charges   No orders of the defined types were placed in this encounter.     Procedures: Large Joint Inj: L greater trochanter on 08/07/2024 1:51 PM Indications: pain and diagnostic evaluation Details: 22 G 3.5 in needle, ultrasound-guided lateral approach  Arthrogram: No  Medications: 5 mL lidocaine  1 %; 4 mL bupivacaine  0.25 %; 40 mg triamcinolone  acetonide 40  MG/ML Outcome: tolerated well, no immediate complications Procedure, treatment alternatives, risks and benefits explained, specific risks discussed. Consent was given by the patient. Immediately prior to procedure a time out was called to verify the correct patient, procedure, equipment, support staff and site/side marked as required. Patient was prepped and draped in the usual sterile fashion.       Clinical Data: No additional findings.  Objective: Vital Signs: There were no vitals taken for this visit.  Physical Exam:  Constitutional: Patient appears well-developed HEENT:  Head: Normocephalic Eyes:EOM are normal Neck: Normal range of motion Cardiovascular: Normal rate Pulmonary/chest: Effort normal Neurologic: Patient is alert Skin: Skin is warm Psychiatric: Patient has normal mood and affect  Ortho Exam: Ortho exam demonstrates tenderness to palpation of that left trochanteric bursa.  Has 5 out of 5 hip abduction adduction and flexion strength.  No paresthesias L1-S1 bilaterally.  Gait is normal.  Specialty Comments:  MRI LUMBAR SPINE WITHOUT CONTRAST   TECHNIQUE: Multiplanar, multisequence MR imaging of the lumbar spine was performed. No intravenous contrast was administered.   COMPARISON:  Radiographs dated December 31, 2021   FINDINGS: Segmentation:  Standard.   Alignment: Mild acute kyphotic angulation at T11-T12 due to compression fracture of T12. Mild anterolisthesis of L4.   Vertebrae: Compression  deformity of T12 vertebral body with approximately 50% vertebral body height loss, without evidence of edema likely a chronic process.   Conus medullaris and cauda equina: Conus extends to the L1-L2 level. Conus and cauda equina appear normal.   Paraspinal and other soft tissues: Nonspecific subcutaneous soft tissue edema.   Disc spaces:   T11-T12: Disc height loss with disc protrusion into the central canal. Mild central canal narrowing. No significant neural  foraminal narrowing. Mild facet joint arthropathy.   T12-L1: No significant disc bulge. No neural foraminal stenosis. No central canal stenosis.   L1-L2: No significant disc bulge. No neural foraminal stenosis. No central canal stenosis.   L2-L3: No significant disc bulge. No neural foraminal stenosis. No central canal stenosis. Bilateral facet joint arthropathy, left worse than the right.   L3-L4: Mild disc protrusion with mild narrowing of lateral recesses bilaterally. Mild ligamentum flavum hypertrophy. Moderate facet joint arthropathy. No significant neural foraminal stenosis.   L4-L5: Moderate circumferential disc protrusion and ligamentum flavum hypertrophy. Mild narrowing of lateral recesses bilaterally. Moderate facet joint arthropathy. No significant neural foraminal stenosis.   L5-S1: Disc height loss and broad-base disc protrusion. Narrowing of bilateral lateral recesses. Moderate facet joint hypertrophy. Mild bilateral neural foraminal stenosis, right worse than the left.   IMPRESSION: 1. Compression deformity of T12 vertebral body with approximately 50% vertebral body height loss, likely a chronic process.   2. Multilevel degenerate disc disease most prominent at L4-L5 and L5-S1 with mild bilateral neural foraminal stenosis at L5-S1.     Electronically Signed   By: Imran  Ahmed D.O.   On: 01/05/2022 09:00  Imaging: XR HIP UNILAT W OR W/O PELVIS 2-3 VIEWS LEFT Result Date: 08/08/2024 AP pelvis lateral radiographs left hip reviewed.  No arthritis.  No fracture.  Mild enthesopathic changes around the trochanters on both sides.  US  Guided Needle Placement - No Linked Charges Result Date: 08/08/2024 .  Ultrasound imaging demonstrates needle placement between the fascia lata and the greater trochanter on the left-hand side with    PMFS History: Patient Active Problem List   Diagnosis Date Noted   OSA (obstructive sleep apnea) 03/20/2024   Obesity 03/20/2024    Aortic atherosclerosis    Hyperlipidemia LDL goal <70 07/28/2023   Statin myopathy 01/19/2023   Coronary artery disease involving native heart without angina pectoris 01/19/2023   Agatston coronary artery calcium  score greater than 400 01/19/2023   BPPV (benign paroxysmal positional vertigo), right 07/11/2021   Fibroma of intraoral region 01/14/2021   Otalgia, left ear 01/14/2021   Temporomandibular joint dysfunction 01/14/2021   Malocclusion 09/02/2020   Anxiety 11/15/2018   Insomnia 11/15/2018   Chronic diastolic heart failure (HCC) 04/18/2018   Benign essential HTN 04/18/2018   Congestive heart failure (HCC) 03/23/2018   Mild dilation of ascending aorta    Elevated blood pressure reading without diagnosis of hypertension    Abnormal EKG 02/07/2016   Edema of extremities 02/07/2016   Joint pain 03/19/2014   Paroxysmal supraventricular tachycardia 12/22/2013   Breast cancer of upper-outer quadrant of left female breast (HCC) 09/19/2013   Vaginal atrophy 08/18/2013   Vaginitis and vulvovaginitis 08/18/2013   Hypothyroidism, postsurgical 04/03/2013   Past Medical History:  Diagnosis Date   Agatston coronary artery calcium  score greater than 400    Ca score 860 with normal Stress PET CT 08/2023   Anxiety    Aortic atherosclerosis    Arthritis    lower back and thumbs   Breast cancer of upper-outer quadrant  of left female breast (HCC) 09/19/2013   Cancer (HCC)    left breast-(12-12-09 radiation and surgery)-Dr. Layla -oncology   Chronic diastolic CHF (congestive heart failure) (HCC)    Colon polyps    sm aden p 10/06 (difficult exam, needs propofol );neg 07/2010   Dyslipidemia    Edema extremities 02/07/2016   GERD (gastroesophageal reflux disease)    Barrett's [facility]), neg for metaplasia '97, '08 (mod HH) 9.2011   HTN (hypertension)    Hypothyroidism, postsurgical 04/03/2013   Laryngitis 12/20/2012   recent episode is resolving with Amoxicillin orally   Mild dilation  of ascending aorta    Obesity    Osteoporosis    Paroxysmal supraventricular tachycardia 12/22/2013   Personal history of radiation therapy    2011   PONV (postoperative nausea and vomiting)    Thyroid  adenoma 12/20/2012   surgery planned 12-30-12   Vaginal atrophy 08/18/2013   Vaginitis and vulvovaginitis 08/18/2013    Family History  Problem Relation Age of Onset   Stroke Mother    Heart disease Father    Cancer Brother        melanoma    Past Surgical History:  Procedure Laterality Date   BREAST LUMPECTOMY Left    2011   BREAST SURGERY     left breast lumpectomy   EYE SURGERY  12-20-12   right eye retina tear repair   THYROIDECTOMY N/A 12/30/2012   Procedure: THYROIDECTOMY;  Surgeon: Krystal CHRISTELLA Spinner, MD;  Location: [facility];  Service: General;  Laterality: N/A;   Social History   Occupational History   Not on file  Tobacco Use   Smoking status: Former    Current packs/day: 0.00    Types: Cigarettes    Quit date: 01/31/2010    Years since quitting: 14.5   Smokeless tobacco: Never  Vaping Use   Vaping status: Never Used  Substance and Sexual Activity   Alcohol  use: Yes    Comment: social- occ   Drug use: No   Sexual activity: Yes    Birth control/protection: Post-menopausal

## 2024-08-17 ENCOUNTER — Ambulatory Visit: Admitting: Orthopedic Surgery

## 2024-08-18 ENCOUNTER — Telehealth: Payer: Self-pay | Admitting: Cardiology

## 2024-08-29 NOTE — Telephone Encounter (Signed)
*  STAT* If patient is at the pharmacy, call can be transferred to refill team.   1. Which medications need to be refilled? (please list name of each medication and dose if known)   ZEPBOUND  5 MG/0.5ML injection vial  2. Would you like to learn more about the convenience, safety, & potential cost savings by using the [facility]?    3. Are you open to using the [facility] (Type [facility]. ).  4. Which pharmacy/location (including street and city if local pharmacy) is medication to be sent to?  [facility] [place], [place] - [address]   5. Do they need a 30 day or 90 day supply?   Patient stated she will need to get a prescription for the 5 mg sent to her Pharmacy as soon as possible as she is out of this medication and the pharmacy wants to send her the increased dosage. Patient noted she is completely out of this medication and wants a call back to confirm prescription has been sent.

## 2024-09-04 ENCOUNTER — Encounter: Payer: Self-pay | Admitting: Radiology

## 2024-09-15 ENCOUNTER — Ambulatory Visit
Admission: RE | Admit: 2024-09-15 | Discharge: 2024-09-15 | Disposition: A | Discharge status: Home / Self Care | Source: Ambulatory Visit | Attending: Internal Medicine | Admitting: Internal Medicine

## 2024-09-15 DIAGNOSIS — Z1231 Encounter for screening mammogram for malignant neoplasm of breast: Secondary | ICD-10-CM | POA: Diagnosis not present

## 2024-09-19 ENCOUNTER — Other Ambulatory Visit: Payer: Self-pay | Admitting: Internal Medicine

## 2024-09-19 DIAGNOSIS — R928 Other abnormal and inconclusive findings on diagnostic imaging of breast: Secondary | ICD-10-CM

## 2024-09-21 ENCOUNTER — Other Ambulatory Visit: Payer: Self-pay | Admitting: Cardiology

## 2024-09-21 ENCOUNTER — Inpatient Hospital Stay: Admission: RE | Admit: 2024-09-21 | Discharge: 2024-09-21 | Attending: Internal Medicine | Admitting: Internal Medicine

## 2024-09-21 DIAGNOSIS — R928 Other abnormal and inconclusive findings on diagnostic imaging of breast: Secondary | ICD-10-CM

## 2024-09-21 DIAGNOSIS — N6321 Unspecified lump in the left breast, upper outer quadrant: Secondary | ICD-10-CM | POA: Diagnosis not present

## 2024-09-22 DIAGNOSIS — Z1283 Encounter for screening for malignant neoplasm of skin: Secondary | ICD-10-CM | POA: Diagnosis not present

## 2024-09-22 DIAGNOSIS — D225 Melanocytic nevi of trunk: Secondary | ICD-10-CM | POA: Diagnosis not present

## 2024-09-22 DIAGNOSIS — D485 Neoplasm of uncertain behavior of skin: Secondary | ICD-10-CM | POA: Diagnosis not present

## 2024-10-03 ENCOUNTER — Other Ambulatory Visit

## 2024-10-03 ENCOUNTER — Encounter

## 2024-10-11 DIAGNOSIS — G4733 Obstructive sleep apnea (adult) (pediatric): Secondary | ICD-10-CM | POA: Diagnosis not present

## 2024-10-18 ENCOUNTER — Other Ambulatory Visit: Payer: Self-pay | Admitting: Cardiology

## 2024-11-14 ENCOUNTER — Telehealth: Payer: Self-pay | Admitting: Cardiology

## 2024-11-14 DIAGNOSIS — M79606 Pain in leg, unspecified: Secondary | ICD-10-CM

## 2024-11-14 NOTE — Telephone Encounter (Signed)
 Call to patient to explain Dr. Shlomo advises that she have ABI, arterial and venous dopplers of legs. Patient verbalizes understanding and agrees to plan. Orders placed.

## 2024-11-14 NOTE — Telephone Encounter (Signed)
 She reports that she does get pain in her left ankle and swelling. She said that the swelling is not bad today, but it varies from day to day. Swelling and tenderness varies from day to day. She also from time to time pain/cramping wakes her up at night just in her left leg. She is wondering if she may have peripheral vascular disease and needs to get some scans to check it out?  She said that it had gotten better for a while, but started up again.   She has been watching her sodium intake and wearing compression stockings daily.   She does plan to go to the orthopedic tomorrow about her back and she will also mention it/show ankle to them.   She said that it is hard to tell about any color change because it is always blue/purple due to broken veins. It does not really look any different than it did the last time she was here and saw Dr Shlomo. She said it is tender when she touches it, no hardened spots.   Appt made for 11/23/24 with Dr Shlomo. Informed her that I will give this information to Dr Shlomo and we will be in touch with her if she recommends any scans before the appointment. Given ER precautions. She verbalized understanding

## 2024-11-14 NOTE — Telephone Encounter (Signed)
 Pt sent message via pt schedule: L ankle; had stopped swelling in July; returned swelling on Dec   Lost 43 lbs on Zepbound  since June; 199 lbs currently: 10 mg dose for 1 wk   No   No   No----- Message ----- From: Jonette KIDD Sent: 11/10/2024  3:41 PM EST To:   Asberry Arloa [place]   Good afternoon,   Are you currently have ankle swelling? If so please answer the following questions:   If swelling, where is the swelling located?    How much weight have you gained and in what time span?    Have you gained 2 pounds in a day or 5 pounds in a week?    Do you have a log of your daily weights (if so, list)?    Are you currently taking a fluid pill?    Are you currently SOB?    Have you traveled recently in a car or plane for an extended period of time?    Thank you

## 2024-11-15 ENCOUNTER — Ambulatory Visit: Admitting: Orthopedic Surgery

## 2024-11-15 ENCOUNTER — Other Ambulatory Visit: Payer: Self-pay

## 2024-11-15 ENCOUNTER — Encounter: Payer: Self-pay | Admitting: Orthopedic Surgery

## 2024-11-15 DIAGNOSIS — M545 Low back pain, unspecified: Secondary | ICD-10-CM | POA: Diagnosis not present

## 2024-11-15 MED ORDER — CYCLOBENZAPRINE HCL 10 MG PO TABS: 10.0000 mg | ORAL_TABLET | Freq: Three times a day (TID) | ORAL | 0 refills | Status: AC | PRN

## 2024-11-15 NOTE — Progress Notes (Signed)
 "  Office Visit Note   Patient: [name]           Date of Birth: 1956/05/20           MRN: [id number] Visit Date: 11/15/2024 Requested by: Vernadine Charlie ORN, MD [address] [place],  [place] [address] PCP: Vernadine Charlie ORN, MD  Subjective: Chief Complaint  Patient presents with   Lower Back - Pain    HPI: [name] is a 69 y.o. female who presents to the office reporting back pain.  She injured her back a week ago when her foot was caught on the floor and she hyperextended her back.  Reports primarily middle back pain which is nonradiating.  Some pain with sitting.  Most comfortable when she is standing.  Heat and Advil help.  She did feel a pop in her back at that time.  She has been improving since it happened.  Taking Flexeril  and Tylenol  and ibuprofen.  All pain is in her back with no radicular leg symptoms.  Okay with coughing and sneezing.  She has a history of left trochanteric injection last October.  MRI scan from 2023 demonstrates compression deformity of T12 which is likely a chronic process.  Multilevel degenerative disc disease at L4-5 and L5-S1 with mild bilateral neuroforaminal stenosis at L5-S1..                ROS: All systems reviewed are negative as they relate to the chief complaint within the history of present illness.  Patient denies fevers or chills.  Assessment & Plan: Visit Diagnoses:  1. Low back pain, unspecified back pain laterality, unspecified chronicity, unspecified whether sciatica present     Plan: Impression is low back pain with hyperextension injury.  Presented refill her Flexeril  and consider MRI scanning if she is not 50% better in the next 4 weeks.  No weakness today.  She will let us  know how she is progressing and we will set her up for more studies and possible ESI's if needed.  Do not see a new compression fracture on plain radiographs today.  Follow-Up Instructions: No follow-ups on file.   Orders:   Orders Placed This Encounter  Procedures   XR Lumbar Spine 2-3 Views   Meds ordered this encounter  Medications   cyclobenzaprine  (FLEXERIL ) 10 MG tablet    Sig: Take 1 tablet (10 mg total) by mouth 3 (three) times daily as needed for muscle spasms.    Dispense:  30 tablet    Refill:  0      Procedures: No procedures performed   Clinical Data: No additional findings.  Objective: Vital Signs: There were no vitals taken for this visit.  Physical Exam:  Constitutional: Patient appears well-developed HEENT:  Head: Normocephalic Eyes:EOM are normal Neck: Normal range of motion Cardiovascular: Normal rate Pulmonary/chest: Effort normal Neurologic: Patient is alert Skin: Skin is warm Psychiatric: Patient has normal mood and affect  Ortho Exam: Ortho exam demonstrates normal gait alignment.  5 out of 5 ankle dorsiflexion plantarflexion quad and hip strength.  No nerve root tension signs.  Patient does have some pain with forward and lateral bending.  Fairly diffuse tenderness around the thoracolumbar junction.  Pedal pulses palpable.  Negative clonus.  Specialty Comments:  MRI LUMBAR SPINE WITHOUT CONTRAST   TECHNIQUE: Multiplanar, multisequence MR imaging of the lumbar spine was performed. No intravenous contrast was administered.   COMPARISON:  Radiographs dated December 31, 2021   FINDINGS:  Segmentation:  Standard.   Alignment: Mild acute kyphotic angulation at T11-T12 due to compression fracture of T12. Mild anterolisthesis of L4.   Vertebrae: Compression deformity of T12 vertebral body with approximately 50% vertebral body height loss, without evidence of edema likely a chronic process.   Conus medullaris and cauda equina: Conus extends to the L1-L2 level. Conus and cauda equina appear normal.   Paraspinal and other soft tissues: Nonspecific subcutaneous soft tissue edema.   Disc spaces:   T11-T12: Disc height loss with disc protrusion into the  central canal. Mild central canal narrowing. No significant neural foraminal narrowing. Mild facet joint arthropathy.   T12-L1: No significant disc bulge. No neural foraminal stenosis. No central canal stenosis.   L1-L2: No significant disc bulge. No neural foraminal stenosis. No central canal stenosis.   L2-L3: No significant disc bulge. No neural foraminal stenosis. No central canal stenosis. Bilateral facet joint arthropathy, left worse than the right.   L3-L4: Mild disc protrusion with mild narrowing of lateral recesses bilaterally. Mild ligamentum flavum hypertrophy. Moderate facet joint arthropathy. No significant neural foraminal stenosis.   L4-L5: Moderate circumferential disc protrusion and ligamentum flavum hypertrophy. Mild narrowing of lateral recesses bilaterally. Moderate facet joint arthropathy. No significant neural foraminal stenosis.   L5-S1: Disc height loss and broad-base disc protrusion. Narrowing of bilateral lateral recesses. Moderate facet joint hypertrophy. Mild bilateral neural foraminal stenosis, right worse than the left.   IMPRESSION: 1. Compression deformity of T12 vertebral body with approximately 50% vertebral body height loss, likely a chronic process.   2. Multilevel degenerate disc disease most prominent at L4-L5 and L5-S1 with mild bilateral neural foraminal stenosis at L5-S1.     Electronically Signed   By: Imran  Ahmed D.O.   On: 01/05/2022 09:00  Imaging: No results found.   PMFS History: Patient Active Problem List   Diagnosis Date Noted   OSA (obstructive sleep apnea) 03/20/2024   Obesity 03/20/2024   Aortic atherosclerosis    Hyperlipidemia LDL goal <70 07/28/2023   Statin myopathy 01/19/2023   Coronary artery disease involving native heart without angina pectoris 01/19/2023   Agatston coronary artery calcium  score greater than 400 01/19/2023   BPPV (benign paroxysmal positional vertigo), right 07/11/2021   Fibroma of  intraoral region 01/14/2021   Otalgia, left ear 01/14/2021   Temporomandibular joint dysfunction 01/14/2021   Malocclusion 09/02/2020   Anxiety 11/15/2018   Insomnia 11/15/2018   Chronic diastolic heart failure (HCC) 04/18/2018   Benign essential HTN 04/18/2018   Congestive heart failure (HCC) 03/23/2018   Mild dilation of ascending aorta    Elevated blood pressure reading without diagnosis of hypertension    Abnormal EKG 02/07/2016   Edema of extremities 02/07/2016   Joint pain 03/19/2014   Paroxysmal supraventricular tachycardia 12/22/2013   Breast cancer of upper-outer quadrant of left female breast (HCC) 09/19/2013   Vaginal atrophy 08/18/2013   Vaginitis and vulvovaginitis 08/18/2013   Hypothyroidism, postsurgical 04/03/2013   Past Medical History:  Diagnosis Date   Agatston coronary artery calcium  score greater than 400    Ca score 860 with normal Stress PET CT 08/2023   Anxiety    Aortic atherosclerosis    Arthritis    lower back and thumbs   Breast cancer of upper-outer quadrant of left female breast (HCC) 09/19/2013   Cancer (HCC)    left breast-(12-12-09 radiation and surgery)-Dr. Layla -oncology   Chronic diastolic CHF (congestive heart failure) (HCC)    Colon polyps    sm aden p  10/06 (difficult exam, needs propofol );neg 07/2010   Dyslipidemia    Edema extremities 02/07/2016   GERD (gastroesophageal reflux disease)    Barrett's [address] [facility]), neg for metaplasia '97, '08 (mod HH) 9.2011   HTN (hypertension)    Hypothyroidism, postsurgical 04/03/2013   Laryngitis 12/20/2012   recent episode is resolving with Amoxicillin orally   Mild dilation of ascending aorta    Obesity    Osteoporosis    Paroxysmal supraventricular tachycardia 12/22/2013   Personal history of radiation therapy    2011   PONV (postoperative nausea and vomiting)    Thyroid  adenoma 12/20/2012   surgery planned 12-30-12   Vaginal atrophy 08/18/2013   Vaginitis and vulvovaginitis 08/18/2013     Family History  Problem Relation Age of Onset   Stroke Mother    Heart disease Father    Cancer Brother        melanoma   Breast cancer Neg Hx     Past Surgical History:  Procedure Laterality Date   BREAST LUMPECTOMY Left    2011   BREAST SURGERY     left breast lumpectomy   EYE SURGERY  12-20-12   right eye retina tear repair   THYROIDECTOMY N/A 12/30/2012   Procedure: THYROIDECTOMY;  Surgeon: Krystal CHRISTELLA Spinner, MD;  Location: [facility];  Service: General;  Laterality: N/A;   Social History   Occupational History   Not on file  Tobacco Use   Smoking status: Former    Current packs/day: 0.00    Types: Cigarettes    Quit date: 01/31/2010    Years since quitting: 14.8   Smokeless tobacco: Never  Vaping Use   Vaping status: Never Used  Substance and Sexual Activity   Alcohol  use: Yes    Comment: social- occ   Drug use: No   Sexual activity: Yes    Birth control/protection: Post-menopausal        "

## 2024-11-16 ENCOUNTER — Ambulatory Visit (HOSPITAL_COMMUNITY): Admission: RE | Admit: 2024-11-16 | Source: Ambulatory Visit

## 2024-11-17 ENCOUNTER — Ambulatory Visit (HOSPITAL_COMMUNITY)
Admission: RE | Admit: 2024-11-17 | Discharge: 2024-11-17 | Disposition: A | Discharge status: Home / Self Care | Source: Ambulatory Visit | Attending: Cardiology | Admitting: Cardiology

## 2024-11-17 DIAGNOSIS — M79606 Pain in leg, unspecified: Secondary | ICD-10-CM | POA: Insufficient documentation

## 2024-11-18 ENCOUNTER — Ambulatory Visit: Payer: Self-pay | Admitting: Cardiology

## 2024-11-21 ENCOUNTER — Telehealth: Payer: Self-pay

## 2024-11-21 NOTE — Telephone Encounter (Signed)
 SABRA

## 2024-11-22 ENCOUNTER — Encounter: Payer: Self-pay | Admitting: Orthopedic Surgery

## 2024-11-22 NOTE — Telephone Encounter (Signed)
 The only one that would be easier on her stomach is Celebrex but since she has a sulfa allergy that is not really recommended either.

## 2024-11-23 ENCOUNTER — Ambulatory Visit: Admitting: Cardiology

## 2024-11-23 MED ORDER — ZEPBOUND 12.5 MG/0.5ML ~~LOC~~ SOLN: 12.5000 mg | SUBCUTANEOUS | 0 refills | Status: AC

## 2024-11-23 NOTE — Addendum Note (Signed)
 Addended by: Kinnedy Mongiello D on: 11/23/2024 02:33 PM   Modules accepted: Orders

## 2024-11-27 ENCOUNTER — Telehealth: Payer: Self-pay | Admitting: Pharmacy Technician

## 2024-11-27 ENCOUNTER — Other Ambulatory Visit (HOSPITAL_COMMUNITY): Payer: Self-pay

## 2024-11-27 DIAGNOSIS — I429 Cardiomyopathy, unspecified: Secondary | ICD-10-CM

## 2024-11-27 NOTE — Telephone Encounter (Signed)
 Patient Advocate Encounter   The patient was approved for a [employer] grant that will help cover the cost of Repatha  Total amount awarded, 2500.00.  Effective: 10/28/24 - 10/27/25   APW:[id number] ERW:[id number] Hmnle:[id number] PI:[id number]   [employer] ID: [id number]   Pharmacy provided with approval and processing information. Patient informed via mychart

## 2024-11-29 ENCOUNTER — Telehealth: Payer: Self-pay | Admitting: Pharmacy Technician

## 2024-11-29 ENCOUNTER — Other Ambulatory Visit: Payer: Self-pay

## 2024-11-29 DIAGNOSIS — I10 Essential (primary) hypertension: Secondary | ICD-10-CM

## 2024-11-29 DIAGNOSIS — I429 Cardiomyopathy, unspecified: Secondary | ICD-10-CM | POA: Insufficient documentation

## 2024-11-29 DIAGNOSIS — I5032 Chronic diastolic (congestive) heart failure: Secondary | ICD-10-CM

## 2024-11-29 NOTE — Telephone Encounter (Signed)
 Patient Advocate Encounter   The patient was approved for a [employer] grant that will help cover the cost of Jardiance  Total amount awarded, 7500.00.  Effective: 10/30/24 - 10/29/25   APW:[id number] ERW:[id number] Hmnle:[id number] PI:[id number]  [employer] ID: [id number]   Pharmacy provided with approval and processing information. Patient informed via mychart

## 2024-12-21 ENCOUNTER — Ambulatory Visit: Admitting: Emergency Medicine

## 2025-01-16 ENCOUNTER — Ambulatory Visit (HOSPITAL_COMMUNITY)
